# Patient Record
Sex: Female | Born: 1935 | Race: White | Hispanic: No | Marital: Married | Smoking: Never smoker
Health system: Southern US, Community
[De-identification: ages and names within clinical notes are randomized; demographics above are authoritative.]

## PROBLEM LIST (undated history)

## (undated) DIAGNOSIS — D126 Benign neoplasm of colon, unspecified: Secondary | ICD-10-CM

## (undated) DIAGNOSIS — K8020 Calculus of gallbladder without cholecystitis without obstruction: Secondary | ICD-10-CM

## (undated) DIAGNOSIS — K5790 Diverticulosis of intestine, part unspecified, without perforation or abscess without bleeding: Secondary | ICD-10-CM

## (undated) DIAGNOSIS — F0390 Unspecified dementia without behavioral disturbance: Secondary | ICD-10-CM

## (undated) DIAGNOSIS — K859 Acute pancreatitis without necrosis or infection, unspecified: Secondary | ICD-10-CM

## (undated) DIAGNOSIS — E785 Hyperlipidemia, unspecified: Secondary | ICD-10-CM

## (undated) DIAGNOSIS — T7840XA Allergy, unspecified, initial encounter: Secondary | ICD-10-CM

## (undated) DIAGNOSIS — K449 Diaphragmatic hernia without obstruction or gangrene: Secondary | ICD-10-CM

## (undated) HISTORY — DX: Benign neoplasm of colon, unspecified: D12.6

## (undated) HISTORY — DX: Diaphragmatic hernia without obstruction or gangrene: K44.9

## (undated) HISTORY — DX: Acute pancreatitis without necrosis or infection, unspecified: K85.90

## (undated) HISTORY — DX: Hyperlipidemia, unspecified: E78.5

## (undated) HISTORY — PX: COLONOSCOPY: SHX174

## (undated) HISTORY — PX: DILATION AND CURETTAGE OF UTERUS: SHX78

## (undated) HISTORY — DX: Calculus of gallbladder without cholecystitis without obstruction: K80.20

## (undated) HISTORY — DX: Diverticulosis of intestine, part unspecified, without perforation or abscess without bleeding: K57.90

## (undated) HISTORY — DX: Allergy, unspecified, initial encounter: T78.40XA

## (undated) HISTORY — PX: TONSILLECTOMY AND ADENOIDECTOMY: SUR1326

---

## 1998-07-11 ENCOUNTER — Other Ambulatory Visit: Admission: RE | Admit: 1998-07-11 | Discharge: 1998-07-11 | Payer: Self-pay | Admitting: Cardiology

## 1999-08-18 ENCOUNTER — Other Ambulatory Visit: Admission: RE | Admit: 1999-08-18 | Discharge: 1999-08-18 | Payer: Self-pay | Admitting: Cardiology

## 1999-09-16 ENCOUNTER — Encounter: Admission: RE | Admit: 1999-09-16 | Discharge: 1999-09-16 | Payer: Self-pay | Admitting: Cardiology

## 1999-12-11 ENCOUNTER — Encounter: Payer: Self-pay | Admitting: Cardiology

## 1999-12-11 ENCOUNTER — Encounter: Admission: RE | Admit: 1999-12-11 | Discharge: 1999-12-11 | Payer: Self-pay | Admitting: Cardiology

## 2000-01-11 ENCOUNTER — Other Ambulatory Visit: Admission: RE | Admit: 2000-01-11 | Discharge: 2000-01-11 | Payer: Self-pay | Admitting: Cardiology

## 2000-01-20 ENCOUNTER — Encounter (INDEPENDENT_AMBULATORY_CARE_PROVIDER_SITE_OTHER): Payer: Self-pay

## 2000-01-20 ENCOUNTER — Other Ambulatory Visit: Admission: RE | Admit: 2000-01-20 | Discharge: 2000-01-20 | Payer: Self-pay | Admitting: Obstetrics & Gynecology

## 2000-03-29 ENCOUNTER — Ambulatory Visit (HOSPITAL_COMMUNITY): Admission: RE | Admit: 2000-03-29 | Discharge: 2000-03-29 | Payer: Self-pay | Admitting: Obstetrics & Gynecology

## 2000-03-29 ENCOUNTER — Encounter (INDEPENDENT_AMBULATORY_CARE_PROVIDER_SITE_OTHER): Payer: Self-pay | Admitting: Specialist

## 2000-12-12 ENCOUNTER — Encounter: Admission: RE | Admit: 2000-12-12 | Discharge: 2000-12-12 | Payer: Self-pay | Admitting: Internal Medicine

## 2000-12-12 ENCOUNTER — Encounter: Payer: Self-pay | Admitting: Internal Medicine

## 2001-03-07 ENCOUNTER — Other Ambulatory Visit: Admission: RE | Admit: 2001-03-07 | Discharge: 2001-03-07 | Payer: Self-pay | Admitting: Obstetrics & Gynecology

## 2001-12-12 ENCOUNTER — Encounter: Payer: Self-pay | Admitting: Internal Medicine

## 2001-12-12 ENCOUNTER — Encounter: Admission: RE | Admit: 2001-12-12 | Discharge: 2001-12-12 | Payer: Self-pay | Admitting: Internal Medicine

## 2002-04-03 ENCOUNTER — Other Ambulatory Visit: Admission: RE | Admit: 2002-04-03 | Discharge: 2002-04-03 | Payer: Self-pay | Admitting: Obstetrics & Gynecology

## 2002-10-23 ENCOUNTER — Encounter: Admission: RE | Admit: 2002-10-23 | Discharge: 2003-01-03 | Payer: Self-pay | Admitting: Orthopedic Surgery

## 2002-12-06 DIAGNOSIS — D126 Benign neoplasm of colon, unspecified: Secondary | ICD-10-CM

## 2002-12-06 HISTORY — DX: Benign neoplasm of colon, unspecified: D12.6

## 2002-12-18 ENCOUNTER — Encounter: Admission: RE | Admit: 2002-12-18 | Discharge: 2002-12-18 | Payer: Self-pay | Admitting: Internal Medicine

## 2002-12-18 ENCOUNTER — Encounter: Payer: Self-pay | Admitting: Internal Medicine

## 2003-04-29 ENCOUNTER — Other Ambulatory Visit: Admission: RE | Admit: 2003-04-29 | Discharge: 2003-04-29 | Payer: Self-pay | Admitting: Obstetrics & Gynecology

## 2003-07-09 ENCOUNTER — Encounter: Admission: RE | Admit: 2003-07-09 | Discharge: 2003-07-09 | Payer: Self-pay | Admitting: Sports Medicine

## 2003-08-09 ENCOUNTER — Encounter: Admission: RE | Admit: 2003-08-09 | Discharge: 2003-08-09 | Payer: Self-pay | Admitting: Sports Medicine

## 2003-09-03 ENCOUNTER — Encounter: Admission: RE | Admit: 2003-09-03 | Discharge: 2003-09-03 | Payer: Self-pay | Admitting: Sports Medicine

## 2003-10-18 ENCOUNTER — Encounter: Admission: RE | Admit: 2003-10-18 | Discharge: 2003-10-18 | Payer: Self-pay | Admitting: Sports Medicine

## 2003-12-10 ENCOUNTER — Encounter: Admission: RE | Admit: 2003-12-10 | Discharge: 2003-12-10 | Payer: Self-pay | Admitting: Obstetrics & Gynecology

## 2003-12-27 ENCOUNTER — Encounter: Admission: RE | Admit: 2003-12-27 | Discharge: 2003-12-27 | Payer: Self-pay | Admitting: Sports Medicine

## 2004-03-03 ENCOUNTER — Encounter: Admission: RE | Admit: 2004-03-03 | Discharge: 2004-03-03 | Payer: Self-pay | Admitting: Sports Medicine

## 2004-05-15 ENCOUNTER — Other Ambulatory Visit: Admission: RE | Admit: 2004-05-15 | Discharge: 2004-05-15 | Payer: Self-pay | Admitting: Obstetrics & Gynecology

## 2004-09-29 ENCOUNTER — Ambulatory Visit: Payer: Self-pay | Admitting: Sports Medicine

## 2004-10-16 ENCOUNTER — Ambulatory Visit: Payer: Self-pay | Admitting: Sports Medicine

## 2004-10-27 ENCOUNTER — Ambulatory Visit: Payer: Self-pay | Admitting: Sports Medicine

## 2004-12-04 ENCOUNTER — Ambulatory Visit: Payer: Self-pay | Admitting: Sports Medicine

## 2005-01-14 ENCOUNTER — Encounter: Admission: RE | Admit: 2005-01-14 | Discharge: 2005-01-14 | Payer: Self-pay | Admitting: Internal Medicine

## 2006-01-18 ENCOUNTER — Encounter: Admission: RE | Admit: 2006-01-18 | Discharge: 2006-01-18 | Payer: Self-pay | Admitting: Internal Medicine

## 2006-02-24 ENCOUNTER — Ambulatory Visit: Payer: Self-pay | Admitting: Gastroenterology

## 2006-03-04 ENCOUNTER — Ambulatory Visit: Payer: Self-pay | Admitting: Gastroenterology

## 2007-01-26 ENCOUNTER — Encounter: Admission: RE | Admit: 2007-01-26 | Discharge: 2007-01-26 | Payer: Self-pay | Admitting: Internal Medicine

## 2008-01-19 ENCOUNTER — Telehealth: Payer: Self-pay | Admitting: Sports Medicine

## 2008-01-30 ENCOUNTER — Encounter: Admission: RE | Admit: 2008-01-30 | Discharge: 2008-01-30 | Payer: Self-pay | Admitting: Internal Medicine

## 2009-01-30 ENCOUNTER — Encounter: Admission: RE | Admit: 2009-01-30 | Discharge: 2009-01-30 | Payer: Self-pay | Admitting: Internal Medicine

## 2010-02-02 ENCOUNTER — Encounter: Admission: RE | Admit: 2010-02-02 | Discharge: 2010-02-02 | Payer: Self-pay | Admitting: Internal Medicine

## 2010-06-15 ENCOUNTER — Ambulatory Visit: Payer: Self-pay | Admitting: Sports Medicine

## 2010-06-15 DIAGNOSIS — M7750 Other enthesopathy of unspecified foot: Secondary | ICD-10-CM | POA: Insufficient documentation

## 2010-06-15 DIAGNOSIS — M7662 Achilles tendinitis, left leg: Secondary | ICD-10-CM | POA: Insufficient documentation

## 2010-06-23 ENCOUNTER — Ambulatory Visit: Payer: Self-pay | Admitting: Sports Medicine

## 2010-06-23 DIAGNOSIS — S32000A Wedge compression fracture of unspecified lumbar vertebra, initial encounter for closed fracture: Secondary | ICD-10-CM | POA: Insufficient documentation

## 2010-06-24 ENCOUNTER — Telehealth: Payer: Self-pay | Admitting: Sports Medicine

## 2010-06-30 ENCOUNTER — Ambulatory Visit: Payer: Self-pay | Admitting: Sports Medicine

## 2010-07-30 ENCOUNTER — Ambulatory Visit: Payer: Self-pay | Admitting: Sports Medicine

## 2010-11-09 ENCOUNTER — Ambulatory Visit: Payer: Self-pay | Admitting: Sports Medicine

## 2010-12-06 HISTORY — PX: CHOLECYSTECTOMY: SHX55

## 2011-01-04 ENCOUNTER — Other Ambulatory Visit: Payer: Self-pay | Admitting: Internal Medicine

## 2011-01-04 DIAGNOSIS — E049 Nontoxic goiter, unspecified: Secondary | ICD-10-CM

## 2011-01-05 NOTE — Assessment & Plan Note (Signed)
Summary: ACHILLES PAIN/MJD   Vital Signs:  Patient profile:   77 year old female Height:      65 inches Weight:      142 pounds BMI:     23.72 BP sitting:   142 / 68  (left arm) Cuff size:   regular  Vitals Entered By: Tessie Fass CMA (June 15, 2010 10:25 AM) CC: right achilles pain   CC:  right achilles pain.  History of Present Illness: 77 year old female with leg pain.  Hx Achilles tendinopathy  ~5 years ago, did eccentric exercises and got better.    Has been doing lots of walking, works out three times weekly on a stride machine, 35 mins, 2 miles.  Has been on her feet an unusually long amount of time this past week.  This past Thursday (4d ago) started noticing pain in R achilles tendon with some swelling.  No injuries, no pops, no weakness.  Getting worse, no radiation.  Worse with palpation, not really better with rest.  Foot pain:  On R foot just at 2nd-3rd metatarsal head.  Hasn't tried any medication.  Current Medications (verified): 1)  Ketoprofen  Powd (Ketoprofen) .... Please Compound Into Paste To Apply To Skin Over Achilles Tendon Two Times A Day.  Allergies (verified): No Known Drug Allergies  Review of Systems       See HPI  Physical Exam  General:  Well-developed,well-nourished,in no acute distress; alert,appropriate and cooperative throughout examination Msk:  Ankle: R No visible erythema or swelling.  Palpable knot in achilles tendon.  Range of motion is full in all directions. Strength is 5/5 in all directions with pain to plantarflexion. Stable lateral and medial ligaments; squeeze test and kleiger test unremarkable; Talar dome nontender; No pain at base of 5th MT; No tenderness over cuboid; No tenderness over N spot or navicular prominence No tenderness on posterior aspects of lateral and medial malleolus Some tenderness over 2nd and 3rd metatarsal heads. No sign of peroneal tendon subluxations; Negative tarsal tunnel tinel's Able to walk  4 steps.  Additional Exam:  MSK [place] with significant (25%) thickening of right achilles tendon compared to left without much increased vascularity.  nodule is 2 cm above insertion.  trans views are normal.  No tears identified.  Images saved.   Impression & Recommendations:  Problem # 1:  ACHILLES TENDINITIS (ICD-726.71) Assessment New  Will start on typical regimen of eccentric exercises, pt to to 8-10 reps with each set instead of the 12 listed on handout. Topical ketoprofen 20% compounded at [facility] BID to skin over point of maximal tenderness. RTC 6 weeks or sooner if no better. Could try topical NTG if no better by then.  Orders: [place] LIMITED ([id number])  Problem # 2:  METATARSALGIA (ICD-726.70) Assessment: New  Right side MT pad placed.  RTC prn if problems  Complete Medication List: 1)  Ketoprofen Powd (Ketoprofen) .... Please compound into paste to apply to skin over achilles tendon two times a day.  Patient Instructions: 1)  Great to meet you today 2)  You have achilles tendinopathy 3)  Do the attached exercises (8-10 reps each set) 4)  Apply topical ketoprofen to area of maximal tenderness (do not spread over entire calf) 5)  Come back to see [place] if no better in 6 weeks. 6)  -Dr. Benjamin Stain Prescriptions: KETOPROFEN  POWD (KETOPROFEN) Please compound into paste to apply to skin over achilles tendon two times a day.  #6wks QS x 0  Entered by:   Rodney Langton MD   Authorized by:   Enid Baas MD   Signed by:   Rodney Langton MD on 06/15/2010   Method used:   Electronically to        [facility]* (retail)       [address]       [place], [place]  [id number]       Ph: [phone]       Fax: [phone]   RxID:   [id number]

## 2011-01-05 NOTE — Assessment & Plan Note (Signed)
Summary: F/U [facility]   Vital Signs:  Patient profile:   77 year old female BP sitting:   110 / 60  Vitals Entered By: Lillia Pauls CMA (July 30, 2010 8:41 AM)  History of Present Illness: 77yo female for f/u LBP and achilles. Back feeling 20-30% better. Still having pain if on feet for long periods of time.  Improves with sitting/resting. Pain occasionally radiates down to both legs.  Denies any change in bowel or bladder.  No numnbess/tingling. Continues to do back exercises.  Going to the gym 3-times/wk.  Walking up to 2.5 miles on machines at gym. Taking ibuprofen as needed several times a week.  Right achilles is a little better. Doing achilles exercises that seem to help.   Allergies: No Known Drug Allergies  Physical Exam  General:  Well-developed,well-nourished,in no acute distress; alert,appropriate and cooperative throughout examination Msk:  Lumbosacral Exam: Inspection-deformity: No deformity, erythema, swelling. Range of Motion:    Forward Flexion:   80 degrees    Hyperextension:   10 degrees    Schober's:        n/a cm    Right Lateral Bend:   25 degrees    Left Lateral Bend:   25 degrees Lying Straight Leg Raise:    Right:  negative    Left:  negative Sitting Straight Leg Raise:    Right:  negative    Left:  negative Contralateral Straight Leg Raise:    Right:  negative    Left:  negative Toe Walking:    Right:  normal    Left:  normal Heel Walking:    Right:  normal    Left:  normal  Achilles with minimal tenderness to palpation.  No swelling.  Neurologic:  sensation intact to light touch.     Impression & Recommendations:  Problem # 1:  LOW BACK PAIN SYNDROME (ICD-724.2) Assessment Improved - Pain improving, noted to have improved ROM on exam today - Cont. to walk regularly - Should continue home exercises.  May start doing light weight lifting with arms. - May cont. ibuprofen as needed - f/u 71-months, may return sooner if  needed.  Her updated medication list for this problem includes:    Ketoprofen Powd (Ketoprofen) .Marland Kitchen... Please compound into paste to apply to skin over achilles tendon two times a day.    Aspirin 81 Mg Chew (Aspirin) .Marland Kitchen... 1 by mouth qhs  Problem # 2:  ACHILLES TENDINITIS (ICD-726.71) Assessment: Improved - Cont achilles exercises - f/u 82-months  Complete Medication List: 1)  Ketoprofen Powd (Ketoprofen) .... Please compound into paste to apply to skin over achilles tendon two times a day. 2)  Simvastatin 20 Mg Tabs (Simvastatin) .Marland Kitchen.. 1 by mouth qd 3)  Aspirin 81 Mg Chew (Aspirin) .Marland Kitchen.. 1 by mouth qhs  Appended Document: F/U [facility] Reviewed recent Dexa Scan results during visit today.  Values in osteopenic range &  noted to be improving from last Dexa Scan.  She should cont. calcium & vitamin D as prescribed.  Should cont. to walk regularly.

## 2011-01-05 NOTE — Assessment & Plan Note (Signed)
Summary: F/U [facility]   Vital Signs:  Patient profile:   77 year old female BP sitting:   105 / 68  Vitals Entered By: Lillia Pauls CMA (November 09, 2010 9:48 AM)  History of Present Illness: Low Back much better If on feeet a lot goes up into upper back and shoulders if standing too long will be helping feed homeless and stands for good while Low back then aches Radiation into rt leg at times  Rt achilles tendon generally better with topical ketoprofen.   Allergies: No Known Drug Allergies  Physical Exam  General:  Well-developed,well-nourished,in no acute distress; alert,appropriate and cooperative throughout examination Msk:  Still has achilles nodule on rt but much smaller No achilles nodule on lt No generalized tendon tenderness 3rd toe longer than the rest on rt  SI joints move Faber tight on rt, less tight on left Rt and lt straight leg raise good at 60 degrees  Hip abduction good on left and right good back motion norm strength      Impression & Recommendations:  Problem # 1:  LOW BACK PAIN SYNDROME (ICD-724.2)  Her updated medication list for this problem includes:    Ketoprofen Powd (Ketoprofen) .Marland Kitchen... Please compound into paste to apply to skin over achilles tendon two times a day.    Aspirin 81 Mg Chew (Aspirin) .Marland Kitchen... 1 by mouth qhs  Orders: Lumbar Flexible Support (N8295)  try backj support  do some exercises to keep back stable  reck 4 mos  Problem # 2:  ACHILLES TENDINITIS (ICD-726.71) much improved  keep up exercises and top med as needed  Complete Medication List: 1)  Ketoprofen Powd (Ketoprofen) .... Please compound into paste to apply to skin over achilles tendon two times a day. 2)  Simvastatin 20 Mg Tabs (Simvastatin) .Marland Kitchen.. 1 by mouth qd 3)  Aspirin 81 Mg Chew (Aspirin) .Marland Kitchen.. 1 by mouth qhs  Patient Instructions: 1)  Stretch out rt hip with leg straight and knee bent 2)  Standing hip rotations 3)  Do a few sit ups and a few knee  to chest exercises 4)  Continue achilles exercises- heel rasies on a step    Orders Added: 1)  Lumbar Flexible Support [L0625] 2)  Est. Patient Level III [62130]

## 2011-01-05 NOTE — Assessment & Plan Note (Signed)
Summary: 9:00,BACK PAIN,MC   Vital Signs:  Patient profile:   77 year old female Pulse (ortho):   62 / minute BP sitting:   134 / 70  (left arm)  Vitals Entered By: Terese Door (June 23, 2010 8:57 AM) CC: Back "went out"on Friday Pain Assessment Patient in pain? yes     Location: back Intensity: 10   CC:  Back "went out"on Friday.  History of Present Illness: 77 yo F w LBP radiated to buttocks on left 5 days ago this started has happened 4 to 5 x in past 10 yrs ago hospitalized with LBP  1 wk ago doing a lot of yard work  no bowel or bladder probs no fever  Allergies: No Known Drug Allergies  Physical Exam  General:  Well-developed,well-nourished,in no acute distress; alert,appropriate and cooperative throughout examination Msk:  No visible paraspinal spasm Tenderness over L4 ROM extremely limited on flexion, extension. Side flexion more limited on left than right. Very limited lumbar rotation Neurologic:  power and sensation intact on both legs. reflexes normal Mildly + SLR on left No bowel or bladder symptoms  able to do heel, toe walk able to stand in supination   Impression & Recommendations:  Problem # 1:  LOW BACK PAIN SYNDROME (ICD-724.2)  Her updated medication list for this problem includes:    Ketoprofen Powd (Ketoprofen) .Marland Kitchen... Please compound into paste to apply to skin over achilles tendon two times a day.    Aspirin 81 Mg Chew (Aspirin) .Marland Kitchen... 1 by mouth qhs    Tramadol Hcl 50 Mg Tabs (Tramadol hcl) .Marland Kitchen... 1 by mouth qid as needed pain  multiple flares of this over past 20 years  sounds like disogenic component with radiation into buttocks on left and + exam  see instructions  Complete Medication List: 1)  Ketoprofen Powd (Ketoprofen) .... Please compound into paste to apply to skin over achilles tendon two times a day. 2)  Simvastatin 20 Mg Tabs (Simvastatin) .Marland Kitchen.. 1 by mouth qd 3)  Aspirin 81 Mg Chew (Aspirin) .Marland Kitchen.. 1 by mouth qhs 4)   Tramadol Hcl 50 Mg Tabs (Tramadol hcl) .Marland Kitchen.. 1 by mouth qid as needed pain  Patient Instructions: 1)  Hot shower 2 to 3 x daily 2)  do motion exercises 3)  walk 5 mins after these 4)  thermacare wraps at night 5)  tramadol for pain up to 4 per day 6)  reck in 1 week Prescriptions: TRAMADOL HCL 50 MG TABS (TRAMADOL HCL) 1 by mouth qid as needed pain  #60 x 2   Entered and Authorized by:   Enid Baas MD   Signed by:   Enid Baas MD on 06/23/2010   Method used:   Electronically to        [phone]* (retail)       [address] [place], [place]  [address]       Ph: [phone] or [phone]       Fax: [phone]   RxID:   [id number]

## 2011-01-05 NOTE — Assessment & Plan Note (Signed)
Summary: 9:00,F/u back,mc   Vital Signs:  Patient profile:   77 year old female Pulse rate:   62 / minute BP sitting:   117 / 72  Vitals Entered By: Terese Door (June 30, 2010 9:05 AM) CC: F/U back pain   CC:  F/U back pain.  History of Present Illness: 77 yo F here for f/u of LBP  Much improved since last visit.  Has been using  thermacare wraps at night, and doing  motion exercises.  Able to start walking regimen again without pain. Occasionally requiring 1/2 tramadol for pain.  Pain not radiating, no numbness or weakness, no bowel or bladder probs   Allergies (verified): No Known Drug Allergies   Detailed Back/Spine Exam  Lumbosacral Exam:  Inspection-deformity:    Normal    small vesicular, erytheratous rash on L LB Range of Motion:    Forward Flexion:   50 degrees    Hyperextension:   10 degrees    Schober's:        n/a cm    Right Lateral Bend:   25 degrees    Left Lateral Bend:   25 degrees Lying Straight Leg Raise:    Right:  negative    Left:  negative Sitting Straight Leg Raise:    Right:  negative    Left:  negative Contralateral Straight Leg Raise:    Right:  negative    Left:  negative Toe Walking:    Right:  normal    Left:  normal Heel Walking:    Right:  normal    Left:  normal   Impression & Recommendations:  Problem # 1:  LOW BACK PAIN SYNDROME (ICD-724.2) Assessment Improved ROM improved since last visit but not returned to baseline.  New rash present likely HS 1 infection.  Not painful and seems to be improving without medical intervention.  see pt instructions  Her updated medication list for this problem includes:    Ketoprofen Powd (Ketoprofen) .Marland Kitchen... Please compound into paste to apply to skin over achilles tendon two times a day.    Aspirin 81 Mg Chew (Aspirin) .Marland Kitchen... 1 by mouth qhs    Tramadol Hcl 50 Mg Tabs (Tramadol hcl) .Marland Kitchen... 1 by mouth qid as needed pain  Problem # 2:  ACHILLES TENDINITIS (ICD-726.71) restart rehab  program now that back is improving  will ck this on RTC in 1 mo  Complete Medication List: 1)  Ketoprofen Powd (Ketoprofen) .... Please compound into paste to apply to skin over achilles tendon two times a day. 2)  Simvastatin 20 Mg Tabs (Simvastatin) .Marland Kitchen.. 1 by mouth qd 3)  Aspirin 81 Mg Chew (Aspirin) .Marland Kitchen.. 1 by mouth qhs 4)  Tramadol Hcl 50 Mg Tabs (Tramadol hcl) .Marland Kitchen.. 1 by mouth qid as needed pain  Patient Instructions: 1)  It's ok to go to the gym, but sit down to do your resistance exercises. 2)  Continue walking and slowly increase your time over the next month. 3)  Take 1/2 pill of the pain medicine if you need it.  Take it at night. 4)  Continue with your achilles exercises. 5)  Please schedule a follow-up appointment in 1 month.

## 2011-01-05 NOTE — Progress Notes (Signed)
Summary: Reaction to Tramadol  ---- Converted from flag ---- ---- 06/24/2010 8:55 AM, Marily Memos wrote: Pt called regarding a reaction  she had with her medicine yesterday. She took  tramadol and was sweating and felt sick after she took it also states that her blood pressure dropped. Pt states that she will try the medicine again if Dr Darrick Penna recommends it. She requested a call back @ [phone]. ------------------------------    Follow-up for Phone Call       Follow-up by: Terese Door,  June 24, 2010 10:12 AM     Spoke with patient.  Advised per Dr. Darrick Penna to try cutting Tramadol in half and try that first.  If she still has the same reaction to call us back.  We may need to changed her to a different medication.  Pt. in agreement....Marland KitchenMarland KitchenTerese Door, CMA

## 2011-01-08 ENCOUNTER — Ambulatory Visit
Admission: RE | Admit: 2011-01-08 | Discharge: 2011-01-08 | Disposition: A | Discharge status: Home / Self Care | Payer: Medicare Other | Source: Ambulatory Visit | Attending: Internal Medicine | Admitting: Internal Medicine

## 2011-01-08 DIAGNOSIS — E049 Nontoxic goiter, unspecified: Secondary | ICD-10-CM

## 2011-01-12 ENCOUNTER — Other Ambulatory Visit: Payer: Self-pay | Admitting: Internal Medicine

## 2011-01-12 DIAGNOSIS — E042 Nontoxic multinodular goiter: Secondary | ICD-10-CM

## 2011-01-14 ENCOUNTER — Other Ambulatory Visit: Payer: Self-pay | Admitting: Internal Medicine

## 2011-01-14 DIAGNOSIS — E042 Nontoxic multinodular goiter: Secondary | ICD-10-CM

## 2011-01-20 ENCOUNTER — Other Ambulatory Visit: Payer: Self-pay | Admitting: Interventional Radiology

## 2011-01-20 ENCOUNTER — Ambulatory Visit
Admission: RE | Admit: 2011-01-20 | Discharge: 2011-01-20 | Disposition: A | Discharge status: Home / Self Care | Payer: Medicare Other | Source: Ambulatory Visit | Attending: Internal Medicine | Admitting: Internal Medicine

## 2011-01-20 ENCOUNTER — Other Ambulatory Visit (HOSPITAL_COMMUNITY)
Admission: RE | Admit: 2011-01-20 | Discharge: 2011-01-20 | Disposition: A | Discharge status: Home / Self Care | Payer: Medicare Other | Source: Ambulatory Visit | Attending: Interventional Radiology | Admitting: Interventional Radiology

## 2011-01-20 DIAGNOSIS — E049 Nontoxic goiter, unspecified: Secondary | ICD-10-CM | POA: Insufficient documentation

## 2011-01-20 DIAGNOSIS — E042 Nontoxic multinodular goiter: Secondary | ICD-10-CM

## 2011-02-08 ENCOUNTER — Other Ambulatory Visit: Payer: Self-pay | Admitting: Internal Medicine

## 2011-02-08 DIAGNOSIS — Z1231 Encounter for screening mammogram for malignant neoplasm of breast: Secondary | ICD-10-CM

## 2011-02-11 ENCOUNTER — Ambulatory Visit: Payer: Medicare Other

## 2011-02-15 ENCOUNTER — Ambulatory Visit: Payer: Medicare Other | Admitting: Sports Medicine

## 2011-02-17 ENCOUNTER — Encounter: Payer: Self-pay | Admitting: Gastroenterology

## 2011-02-19 ENCOUNTER — Ambulatory Visit
Admission: RE | Admit: 2011-02-19 | Discharge: 2011-02-19 | Disposition: A | Discharge status: Home / Self Care | Payer: Medicare Other | Source: Ambulatory Visit | Attending: Internal Medicine | Admitting: Internal Medicine

## 2011-02-19 DIAGNOSIS — Z1231 Encounter for screening mammogram for malignant neoplasm of breast: Secondary | ICD-10-CM

## 2011-02-23 NOTE — Letter (Signed)
Summary: Colonoscopy Letter  [facility]  [address] [place], [place] [address]   Phone: [phone]  Fax: [phone]      February 17, 2011 MRN: [id number]   Autumn Wallace [address] [place], [place]  [address]   Dear Autumn Wallace,   According to your medical record, it is time for you to schedule a Colonoscopy. The American Cancer Society recommends this procedure as a method to detect early colon cancer. Patients with a family history of colon cancer, or a personal history of colon polyps or inflammatory bowel disease are at increased risk.  This letter has been generated based on the recommendations made at the time of your procedure. If you feel that in your particular situation this may no longer apply, please contact our office.  Please call our office at [phone] to schedule this appointment or to update your records at your earliest convenience.  Thank you for cooperating with [place] to provide you with the very best care possible.   Sincerely,  Judie Petit T. Russella Dar, M.D.  [facility] [phone]

## 2011-03-10 ENCOUNTER — Ambulatory Visit (INDEPENDENT_AMBULATORY_CARE_PROVIDER_SITE_OTHER): Payer: Medicare Other | Admitting: Sports Medicine

## 2011-03-10 VITALS — BP 112/69

## 2011-03-10 DIAGNOSIS — M7660 Achilles tendinitis, unspecified leg: Secondary | ICD-10-CM

## 2011-03-10 DIAGNOSIS — M545 Low back pain, unspecified: Secondary | ICD-10-CM

## 2011-03-10 NOTE — Progress Notes (Signed)
  Subjective:    Patient ID: Autumn Wallace, female    DOB: [birth year], 77 y.o.   MRN: [id number]  HPI LBP - now working outside Careful and does use brace No flares since last visit Only meds tylenol and ibuprofen  Accidental fall on RT knee in Feb Felt bruised for a couple of weeks but now better  RT AT Still some pain that does better with top ketoprofen  Now back to full exercise routine   Review of Systems     Objective:   Physical Exam Alert and in NAD  Knee: Normal to inspection with no erythema or effusion or obvious bony abnormalities. Palpation normal with no warmth or joint line tenderness or patellar tenderness or condyle tenderness. ROM normal in flexion and extension and lower leg rotation. Ligaments with solid consistent endpoints including ACL, PCL, LCL, MCL. Negative Mcmurray's and provocative meniscal tests. Non painful patellar compression. Patellar and quadriceps tendons unremarkable. Hamstring and quadriceps strength is normal.  Back with good extension and flexion Norm rotation and lat bending Good heel, toe walk  Norm strength Neg SLR  RT AT  Nodule is smaller and less firm No TTP  Left shows minimal nodule and no TTP       Assessment & Plan:

## 2011-03-10 NOTE — Assessment & Plan Note (Signed)
No recent fluids and symptoms and she estimates that she is at least 75% better. She is back doing yard work and has not had any persistent back pain after any of her activities.  I am happy to recheck this in 6 months if she is doing well.

## 2011-03-10 NOTE — Assessment & Plan Note (Signed)
This clinically looks improved. At the present time it does not limit her from walking up to 3 miles a day on the exercise equipment. With this in mind I think she should keep up some of her exercises and we will not make any changes in treatment. She continues to use topical ketoprofen when needed.

## 2011-04-23 NOTE — Op Note (Signed)
[facility]  Patient:    Autumn Wallace, Autumn Wallace                     MRN: [id number] Proc. Date: 03/29/00 Adm. Date:  [id number] Attending:  Genia Del                           Operative Report  PREOPERATIVE DIAGNOSIS:       Postmenopausal abnormal uterine bleeding on hormone replacement therapy with thickened endometrium on ultrasound.  POSTOPERATIVE DIAGNOSIS:      Postmenopausal abnormal uterine bleeding on hormone replacement therapy with thickened endometrium on ultrasound.  OPERATION:                    Diagnostic hysteroscopy plus D&C.  SURGEON:                      Genia Del, M.D.  ASSISTANT:  ANESTHESIA:                   Ellison Hughs., M.D.  ESTIMATED BLOOD LOSS:  DESCRIPTION OF PROCEDURE:     Under MAC, the patient is in lithotomy position. She is prepped with Betadine in the suprapubic, vulvar, and vaginal areas, and draped as usual.  The vaginal examination revealed a retroverted uterus, normal in volume, and mobile.  No adnexal mass.  The speculum was introduced.  A paracervical block is done with Nesocaine 1% 20 cc total.  Then the anterior lip of the cervix was  grasped with a tenaculum.  Dilatation is achieved with Hegar dilators up to #27  easily.  Then the diagnostic hysteroscope is introduced.  The cavity is visualized entirely. No lesion is seen.  Pictures are taken.  The diagnostic hysteroscope s removed.  Then we proceeded with systematic curettage with a sharp curet on all  intrauterine surfaces.  The specimen is sent to pathology.  A last look is taken with the diagnostic hysteroscope.  The cavity is intact and normal.  Hemostasis is adequate.  Instruments are removed.  Estimated blood loss was minimal.  No complications occurred and the patient was transferred to the recovery room in ood status. DD:  03/29/00 TD:  03/29/00 Job: [id number]

## 2011-06-10 ENCOUNTER — Encounter: Payer: Self-pay | Admitting: Gastroenterology

## 2011-06-25 ENCOUNTER — Other Ambulatory Visit: Payer: Self-pay | Admitting: Sports Medicine

## 2011-06-30 ENCOUNTER — Ambulatory Visit (INDEPENDENT_AMBULATORY_CARE_PROVIDER_SITE_OTHER): Payer: Medicare Other | Admitting: Sports Medicine

## 2011-06-30 ENCOUNTER — Encounter: Payer: Self-pay | Admitting: Sports Medicine

## 2011-06-30 VITALS — BP 124/79 | HR 69 | Ht 65.0 in | Wt 125.0 lb

## 2011-06-30 DIAGNOSIS — M7660 Achilles tendinitis, unspecified leg: Secondary | ICD-10-CM

## 2011-06-30 NOTE — Patient Instructions (Signed)
Start doing toe raise and lower exercise on a book- with knees straight, and knees bent   Start with doing 2 sets of 8 every other day Then after a few days - increase to 2 sets of 10, continue increasing every few days until you get to 2 sets of 15 After you are comfortable doing 2 sets of 15, add a 3rd set

## 2011-06-30 NOTE — Progress Notes (Signed)
  Subjective:    Patient ID: Autumn Wallace, female    DOB: 07-Jul-1932, 77 y.o.   MRN: [id number]  HPI  Pt presents to clinic for evaluation of achilles tendon pain L > R now. L achilles is swelling and painful now, started about 1 week ago Pt has been very active this summer, mowing, gardening, working out at [employer] x3 week.   Uses stride machine, leg press, and shoulder pull downs at the gym.  Uses ketoprofen gel which is helpful  She had partial tears of her Achilles tendon in the past Both of these responded to exercises and topical ketoprofen Originally her right Achilles had a large nodule that is gradually returning to more normal with   Review of Systems     Objective:   Physical Exam    pleasant and in no acute distress The right Achilles tendon is normal in thickness at the insertion 3 cm above the calcaneus there is a nodule that is mildly thickened but nontender Calf muscle appear strong  Left Achilles tendon is visibly swollen starting about 1 cm above the calcaneus extending upward 4 cm This is tender to squeeze There is no redness The insertion is nonpainful  Gait shows that she is limping with poor pushoff on the left  MSK ultrasound  Right Achilles tendon shows normal texture At the insertion with is about 0.4 cm  She does have a nodule about 2 cm above the insertion that is about 0.63 cm greatest width There is no sign of tearing on longitudinal or trans view Doppler is normal  Left Achilles tendon There is some hypoechoic change in the sheath surrounding the tendon The width of the tendon at the insertion is about 0.4 cm Stomach 1 cm and extending to 4 cm above the insertion there is a nodule that gradually thickens Mrs. 0.84 cm in maximum thickness There is a calcification and some hypoechoic change on trans view Doppler activity is normal    Assessment & Plan:

## 2011-06-30 NOTE — Assessment & Plan Note (Signed)
She appears to have developed a flare of her chronic Achilles problems She has done very well with these in the past 2 years Now the primary tendon involved is the left and she has no symptoms on the right  We will start her on ketoprofen 4 times a day - gel  very easy Achilles rehabilitation doing these exercises on a book  Heel lifts in all shoes Icing daily Recheck in 4-6 weeks with repeat scan

## 2011-07-21 ENCOUNTER — Ambulatory Visit (INDEPENDENT_AMBULATORY_CARE_PROVIDER_SITE_OTHER): Payer: Medicare Other | Admitting: Sports Medicine

## 2011-07-21 ENCOUNTER — Ambulatory Visit (AMBULATORY_SURGERY_CENTER): Payer: Medicare Other | Admitting: *Deleted

## 2011-07-21 VITALS — BP 105/64

## 2011-07-21 VITALS — Ht 65.0 in | Wt 137.0 lb

## 2011-07-21 DIAGNOSIS — M7660 Achilles tendinitis, unspecified leg: Secondary | ICD-10-CM

## 2011-07-21 DIAGNOSIS — Z1211 Encounter for screening for malignant neoplasm of colon: Secondary | ICD-10-CM

## 2011-07-21 MED ORDER — PEG-KCL-NACL-NASULF-NA ASC-C 100 G PO SOLR: ORAL | Status: DC

## 2011-07-21 NOTE — Progress Notes (Signed)
  Subjective:    Patient ID: Autumn Wallace, female    DOB: [birth year], 77 y.o.   MRN: [id number]  HPI Patient returns 3 wks after Tx for Left AT She is using voltaren She is doing exercises She has already seen some improvement Xtraining on bike and not walking much though at this point   Review of Systems     Objective:   Physical Exam  NAD L AT has swollen nodule that goes from 2-5 cm above calcaneus that is moderately large R AT has smaller nod 2-4 cm above calcaneus that is non tender to squeeze Hip flexion and abduction strength excellent bilaterally Quad and HS strength excellent bilat Able to do heel raise on both legs with only moderate pain       Assessment & Plan:

## 2011-07-21 NOTE — Assessment & Plan Note (Signed)
She continues to make progress but has trouble finding shoes with her nodules as they are uncomfortable with any pressure  Left is symptomatic now but not right  Continue exercises daily but xtrain on bike until exercises fell easy before restarting walking program  Cont on topical ketofprofen  Reck 6 wks and rescan at that point

## 2011-07-30 ENCOUNTER — Telehealth: Payer: Self-pay | Admitting: Gastroenterology

## 2011-07-30 NOTE — Telephone Encounter (Signed)
Patient was seen by Dr Waynard Edwards from [facility] for abdominal pain.  The CT scan shows ? Acute pancreatitis they are sending her for an [place] today at 2:15 at [facility].  The patient is scheduled to see Dr STark on 08/02/11 10:45.  Per Amy patient is ok to wait for an appt till Monday.

## 2011-08-02 ENCOUNTER — Ambulatory Visit (INDEPENDENT_AMBULATORY_CARE_PROVIDER_SITE_OTHER): Payer: Medicare Other | Admitting: Gastroenterology

## 2011-08-02 VITALS — BP 122/70 | HR 60 | Ht 65.0 in | Wt 133.4 lb

## 2011-08-02 DIAGNOSIS — K859 Acute pancreatitis without necrosis or infection, unspecified: Secondary | ICD-10-CM

## 2011-08-02 NOTE — Progress Notes (Signed)
History of Present Illness: This is a 77 year old female who had the sudden onset of abdominal pain associated with vomiting one week ago on Saturday. She relates that her pain was primarily in the epigastric and right upper quadrant region. She was evaluated by Dr. Waynard Edwards and a CT scan of the abdomen and pelvis showed mild to moderate thickening of the pancreas with surrounding stranding. In addition a moderate sized hiatal hernia was noted as well as a large amount of feces in the colon. Labs revealed minimally elevated lipase at 74 with the upper limit of normal 59. Amylase was normal at 74 with the upper limit of normal of 124. Liver enzymes showed a minimally elevated ALT at 54. CBC and urinalysis were unremarkable. Calcium mildly elevated at 10.6. Ultrasound was performed and the reading is pending. For a few months she has had recurrent mild right lower quadrant pain associated with walking and certain positions. About 2 weeks before her symptoms she was started on a ketoprofen topical compound by Dr. Enid Baas. She has taken simvastatin on a regular basis for about 2 years. Denies weight loss,  constipation, diarrhea, change in stool caliber, melena, hematochezia, dysphagia, reflux symptoms, chest pain.  Past Medical History  Diagnosis Date  . Allergy     seasonal  . Hyperlipidemia   . Acute pancreatitis   . Hiatal hernia   . Diverticulosis   . Adenomatous colon polyp 2004  . Pancreatitis    Past Surgical History  Procedure Date  . Colonoscopy   . Tonsillectomy and adenoidectomy   . Dilation and curettage of uterus     reports that she has never smoked. She has never used smokeless tobacco. She reports that she does not drink alcohol or use illicit drugs. family history includes Breast cancer in her mother.  There is no history of Colon cancer. Allergies  Allergen Reactions  . Latex Swelling   Outpatient Encounter Prescriptions as of 08/02/2011  Medication Sig Dispense Refill  .  aspirin 81 MG tablet Take 81 mg by mouth daily.        . Calcium Carbonate (CALCIUM 600 PO) Take 1 tablet by mouth daily.        . Ketoprofen POWD APPLY TO ACHILLES TENDON TWICE DAILY.  60 g  prn  . Magnesium 100 MG CAPS Take 100 capsules by mouth daily.        . metroNIDAZOLE (METROCREAM) 0.75 % cream Place 0.75 % onto the skin as needed.      . Multiple Vitamin (MULTI-VITAMIN PO) Take 1 tablet by mouth daily.        . peg 3350 powder (MOVIPREP) 100 G SOLR MOVI PREP take as directed  1 kit  0  . Pyridoxine HCl (VITAMIN B-6) 500 MG tablet Take 1,000 mg by mouth daily.        . simvastatin (ZOCOR) 20 MG tablet Take 20 mg by mouth at bedtime.        . vitamin B-12 (CYANOCOBALAMIN) 1000 MCG tablet Take 1,000 mcg by mouth daily.        Marland Kitchen VITAMIN D, CHOLECALCIFEROL, PO Take 400 mg by mouth daily.         Review of Systems: Pertinent positive and negative review of systems were noted in the above HPI section. All other review of systems were otherwise negative.  Physical Exam: General: Well developed , well nourished, no acute distress Head: Normocephalic and atraumatic Eyes:  sclerae anicteric, EOMI Ears: Normal auditory acuity Mouth: No deformity  or lesions Neck: Supple, no masses or thyromegaly Lungs: Clear throughout to auscultation Heart: Regular rate and rhythm; no murmurs, rubs or bruits Abdomen: Soft, non tender and non distended. No masses, hepatosplenomegaly or hernias noted. Normal Bowel sounds Musculoskeletal: Symmetrical with no gross deformities  Skin: No lesions on visible extremities Pulses:  Normal pulses noted Extremities: No clubbing, cyanosis, edema or deformities noted Neurological: Alert oriented x 4, grossly nonfocal Cervical Nodes:  No significant cervical adenopathy Inguinal Nodes: No significant inguinal adenopathy Psychological:  Alert and cooperative. Normal mood and affect  Assessment and Recommendations:   1. Acute Pancreatitis. Symptoms resolving. Rule out  cholelithiasis, hypercalcemia, hyperlipidemia, medications and other etiologies. Await abdominal ultrasound report. Discontinue ketoprofen as NSAIDs can be associated with pancreatitis and this medication was started shortly before the onset of her symptoms. Consider repeat CT scan or MRI/MRCP. Obtain lipid panel and repeat CMET, CBC, lipase and amylase. Return office visit in 2 weeks. She is to resume her regular diet.  2. Personal history of adenomatous colon polyps. Reschedule colonoscopy which was originally scheduled for tomorrow.  3. Intermittent mild right lower quadrant pain which has musculoskeletal features.

## 2011-08-02 NOTE — Patient Instructions (Addendum)
Go to the basement tomorrow to have fasting labs drawn. Nothing by mouth 8 hours prior. Resume regular diet.  You have been scheduled for a Colonoscopy. See separate instructions.  cc: Rodrigo Ran, MD

## 2011-08-03 ENCOUNTER — Other Ambulatory Visit: Payer: Medicare Other | Admitting: Gastroenterology

## 2011-08-03 ENCOUNTER — Telehealth: Payer: Self-pay | Admitting: Gastroenterology

## 2011-08-03 ENCOUNTER — Other Ambulatory Visit (INDEPENDENT_AMBULATORY_CARE_PROVIDER_SITE_OTHER): Payer: Medicare Other

## 2011-08-03 DIAGNOSIS — K859 Acute pancreatitis without necrosis or infection, unspecified: Secondary | ICD-10-CM

## 2011-08-03 LAB — CBC WITH DIFFERENTIAL/PLATELET
HCT: 43.2 % (ref 36.0–46.0)
Lymphocytes Relative: 27 % (ref 12.0–46.0)
MCHC: 33.2 g/dL (ref 30.0–36.0)
Monocytes Relative: 7.3 % (ref 3.0–12.0)
Platelets: 296 10*3/uL (ref 150.0–400.0)
RDW: 13.2 % (ref 11.5–14.6)

## 2011-08-03 LAB — COMPREHENSIVE METABOLIC PANEL
ALT: 25 U/L (ref 0–35)
Albumin: 3.9 g/dL (ref 3.5–5.2)
Alkaline Phosphatase: 70 U/L (ref 39–117)
BUN: 18 mg/dL (ref 6–23)
Calcium: 9.6 mg/dL (ref 8.4–10.5)
Glucose, Bld: 95 mg/dL (ref 70–99)
Sodium: 142 mEq/L (ref 135–145)
Total Bilirubin: 0.5 mg/dL (ref 0.3–1.2)
Total Protein: 7.3 g/dL (ref 6.0–8.3)

## 2011-08-03 LAB — LIPID PANEL: Cholesterol: 161 mg/dL (ref 0–200)

## 2011-08-03 LAB — LIPASE: Lipase: 46 U/L (ref 11.0–59.0)

## 2011-08-03 LAB — AMYLASE: Amylase: 65 U/L (ref 27–131)

## 2011-08-03 NOTE — Telephone Encounter (Signed)
Pt states she received my message about her normal blood work and she wanted to know if we received the ultrasound report from her primary care physician. I told her we just received the report and she will be contacted by her PCP regarding the reports results. Pt states she just wanted to make sure Dr. Russella Dar got those results. Informed patient again that we have the results of her scan.

## 2011-08-11 ENCOUNTER — Telehealth (INDEPENDENT_AMBULATORY_CARE_PROVIDER_SITE_OTHER): Payer: Self-pay | Admitting: Surgery

## 2011-08-11 NOTE — Telephone Encounter (Signed)
I called pt and notified her that I don't have anything sooner with Dr Ezzard Standing but offered another md.  She agreed and I transferred her call to the front desk

## 2011-08-12 ENCOUNTER — Encounter (INDEPENDENT_AMBULATORY_CARE_PROVIDER_SITE_OTHER): Payer: Self-pay | Admitting: Surgery

## 2011-08-12 ENCOUNTER — Ambulatory Visit (INDEPENDENT_AMBULATORY_CARE_PROVIDER_SITE_OTHER): Payer: Medicare Other | Admitting: Surgery

## 2011-08-12 VITALS — BP 118/72 | HR 60 | Temp 96.3°F | Ht 65.0 in | Wt 132.4 lb

## 2011-08-12 DIAGNOSIS — K859 Acute pancreatitis without necrosis or infection, unspecified: Secondary | ICD-10-CM

## 2011-08-12 DIAGNOSIS — K851 Biliary acute pancreatitis without necrosis or infection: Secondary | ICD-10-CM | POA: Insufficient documentation

## 2011-08-12 NOTE — Progress Notes (Signed)
Chief Complaint  Patient presents with  . Cholelithiasis    HPI Autumn Wallace is a 77 y.o. female.   HPI This patient had a recent attack of abdominal pain on August 22. She had an omelette for breakfast initially thereafter started having some abdominal pain followed by nausea and vomiting. She was evaluated by Dr. Rodrigo Wallace who checked a CT scan. This showed mild pancreatitis. This was confirmed on lab tests. An ultrasound subsequently showed cholelithiasis but a normal-size common bile duct. She was evaluated by Dr. Claudette Wallace of GI who repeated her blood work which has all returned to normal. She is now referred for evaluation for cholecystectomy Past Medical History  Diagnosis Date  . Allergy     seasonal  . Hyperlipidemia   . Acute pancreatitis   . Hiatal hernia   . Diverticulosis   . Adenomatous colon polyp 2004  . Pancreatitis     Past Surgical History  Procedure Date  . Colonoscopy   . Tonsillectomy and adenoidectomy   . Dilation and curettage of uterus     Family History  Problem Relation Age of Onset  . Breast cancer Mother   . Colon cancer Neg Hx     Social History History  Substance Use Topics  . Smoking status: Never Smoker   . Smokeless tobacco: Never Used  . Alcohol Use: No    Allergies  Allergen Reactions  . Latex Swelling    Current Outpatient Prescriptions  Medication Sig Dispense Refill  . aspirin 81 MG tablet Take 81 mg by mouth daily.        . Calcium Carbonate (CALCIUM 600 PO) Take 1 tablet by mouth daily.        . Ketoprofen POWD APPLY TO ACHILLES TENDON TWICE DAILY.  60 g  prn  . Magnesium 100 MG CAPS Take 100 capsules by mouth daily.        . metroNIDAZOLE (METROCREAM) 0.75 % cream Place 0.75 % onto the skin as needed.      . Multiple Vitamin (MULTI-VITAMIN PO) Take 1 tablet by mouth daily.        . peg 3350 powder (MOVIPREP) 100 G SOLR MOVI PREP take as directed  1 kit  0  . Pyridoxine HCl (VITAMIN B-6) 500 MG tablet Take  1,000 mg by mouth daily.        . simvastatin (ZOCOR) 20 MG tablet Take 20 mg by mouth at bedtime.        . vitamin B-12 (CYANOCOBALAMIN) 1000 MCG tablet Take 1,000 mcg by mouth daily.        Marland Kitchen VITAMIN D, CHOLECALCIFEROL, PO Take 400 mg by mouth daily.          Review of Systems Review of Systems Blood pressure 118/72, pulse 60, temperature 96.3 F (35.7 C), temperature source Temporal, height 5\' 5"  (1.651 m), weight 132 lb 6.4 oz (60.056 kg). RUQ abd pain, distention, mild nausea Physical Exam Physical Exam WDWN in NAD HEENT:  EOMI, sclera anicteric Neck:  No masses, no thyromegaly Lungs:  CTA bilaterally; normal respiratory effort CV:  Regular rate and rhythm; no murmurs Abd:  +bowel sounds, soft, mildly tender in the RUQ/ epigastrium, no masses Ext:  Well-perfused; no edema Skin:  Warm, dry; no sign of jaundice  Data Reviewed CT scan 8/22 - acute pancreatitis U/S 8/24 - cholelithiasis Assessment    Cholelithiasis with recent gallstone pancreatitis   Plan    Lap chole with intraoperative cholangiogram.  I discussed the  procedure in detail.  The patient was given Agricultural engineer.  We discussed the risks and benefits of a laparoscopic cholecystectomy including, but not limited to bleeding, infection, injury to surrounding structures such as the intestine or liver, bile leak, retained gallstones, need to convert to an open procedure, prolonged diarrhea, blood clots such as  DVT, common bile duct injury, anesthesia risks, and possible need for additional procedures.  We discussed the typical post-operative recovery course.       Autumn Wallace K. 08/12/2011, 10:21 AM

## 2011-08-16 ENCOUNTER — Other Ambulatory Visit (INDEPENDENT_AMBULATORY_CARE_PROVIDER_SITE_OTHER): Payer: Self-pay | Admitting: Surgery

## 2011-08-16 DIAGNOSIS — K8010 Calculus of gallbladder with chronic cholecystitis without obstruction: Secondary | ICD-10-CM

## 2011-08-18 ENCOUNTER — Ambulatory Visit: Payer: Medicare Other | Admitting: Gastroenterology

## 2011-08-26 ENCOUNTER — Ambulatory Visit (INDEPENDENT_AMBULATORY_CARE_PROVIDER_SITE_OTHER): Payer: Self-pay | Admitting: Surgery

## 2011-09-01 ENCOUNTER — Ambulatory Visit (INDEPENDENT_AMBULATORY_CARE_PROVIDER_SITE_OTHER): Payer: Medicare Other | Admitting: Surgery

## 2011-09-01 ENCOUNTER — Ambulatory Visit: Payer: Medicare Other | Admitting: Sports Medicine

## 2011-09-01 VITALS — BP 118/78 | HR 72 | Temp 96.9°F | Resp 16 | Ht 65.0 in | Wt 131.4 lb

## 2011-09-01 DIAGNOSIS — K851 Biliary acute pancreatitis without necrosis or infection: Secondary | ICD-10-CM

## 2011-09-01 DIAGNOSIS — K859 Acute pancreatitis without necrosis or infection, unspecified: Secondary | ICD-10-CM

## 2011-09-01 NOTE — Progress Notes (Signed)
Status post left upper cholecystectomy with intraoperative cholangiogram on September 10. The pathology showed chronic cholecystitis. The patient did remarkably well after surgery. She went to 3 pain pills. She actually broke her Vicodin in half and just took 3 of these. Her incisions are healing well with no sign of infection. Appetite is good but she has not yet started eating red meat or any lesions and the amount of fat. She is actually a little bit constipated. She has taken some occasional laxatives. I told that she could resume a regular diet. She may use laxatives p.r.n. basis. Followup p.r.n. Number strictures of her activity.

## 2011-09-01 NOTE — Patient Instructions (Signed)
Follow-up PRn

## 2011-09-08 ENCOUNTER — Ambulatory Visit: Payer: Medicare Other | Admitting: Sports Medicine

## 2011-09-09 ENCOUNTER — Other Ambulatory Visit: Payer: Medicare Other | Admitting: Gastroenterology

## 2011-09-15 ENCOUNTER — Ambulatory Visit (INDEPENDENT_AMBULATORY_CARE_PROVIDER_SITE_OTHER): Payer: Medicare Other | Admitting: Sports Medicine

## 2011-09-15 ENCOUNTER — Encounter: Payer: Self-pay | Admitting: Sports Medicine

## 2011-09-15 VITALS — BP 121/73 | HR 61 | Ht 65.0 in | Wt 130.0 lb

## 2011-09-15 DIAGNOSIS — M7660 Achilles tendinitis, unspecified leg: Secondary | ICD-10-CM

## 2011-09-15 NOTE — Assessment & Plan Note (Addendum)
Improved with some exercises and rest secondary to cholecystectomy.   Pt shown how to isolate soleus with knee bent at 20 degrees to do heel dips and raises. Also shown how to walk pigeon toed with weights.  Pt advised to start exercising again, walking at half the time she was doing prior to her surgery.   She will ck with me in 2 mos for repeat scan of left nodule

## 2011-09-15 NOTE — Progress Notes (Signed)
  Subjective:    Patient ID: Autumn Wallace, female    DOB: [birth year], 77 y.o.   MRN: [id number]  HPI Autumn Wallace comes in for followup of her Achilles tendinitis. She reports that her left Achilles is feeling much better. She has been doing heel raise is and negative heel exercises on a step.   However Autumn Wallace reports that she had her gallbladder out since she was seen here last. She says she has had to rest a lot due to her illness and surgery. Because of that she has not been able to exercise very much.   The patient says that she is still using the ketoprofen gel, despite the fact that a gastroenterologist told her to not to. She says that she would rather use this gel then take ibuprofen by mouth.   Review of Systems Her history of present illness.    Objective:   Physical Exam  BP 121/73  Pulse 61  Ht 5\' 5"  (1.651 m)  Wt 130 lb (58.968 kg)  BMI 21.63 kg/m2 General appearance: alert, cooperative and no distress MSK: Patient has minimal tenderness with palpation of the left Achilles tendon. Left AT has visible and palpable nodule from 2 to 3 cms above calcaneal insertion No tenderness with palpation of right Achilles tendon Patient has good plantar flexion strength as well as dorsiflexion strength bilaterally. The patient has good strength to plantar flexes and on her toes without pain or difficulty.   She can do one leg heel raises without pain     Assessment & Plan:

## 2011-09-15 NOTE — Patient Instructions (Signed)
It was good to see you.  Please keep doing your straight leg calf raises and dips.   Also, start doing calf raises and dips with your knee bent about 20 degrees.  Hold your knee at this angle, and do 2-3 sets of 10 raises, and dips, and increase as you get stronger. Also, try walking pigeon toed, on your tip toes, holding some weights in your hands. You can start walking again, but start at 15 minutes at a time, and increase by 5 minutes per week.

## 2011-09-20 ENCOUNTER — Telehealth: Payer: Self-pay | Admitting: Gastroenterology

## 2011-09-20 ENCOUNTER — Telehealth (INDEPENDENT_AMBULATORY_CARE_PROVIDER_SITE_OTHER): Payer: Self-pay

## 2011-09-20 NOTE — Telephone Encounter (Signed)
Patient called and said she is continuing to have pain after GB surgery a month ago. She said it is more of a dull annoying pain.that happens when she is active. I made her an appointment to see Dr Corliss Skains and told her to take tylenol when she knew she would be active. I told her to call if her pain got worse and another one of our physicians could see her. She agreed and said she would call and cancel appointment if tylenol helped or pain stopped.

## 2011-09-20 NOTE — Telephone Encounter (Signed)
Patient wants to wait a few more weeks to heal form her cholecystectomy.  I have rescheduled her colon for 10/12/11 9:30

## 2011-09-21 ENCOUNTER — Encounter (INDEPENDENT_AMBULATORY_CARE_PROVIDER_SITE_OTHER): Payer: Self-pay | Admitting: General Surgery

## 2011-09-24 ENCOUNTER — Encounter (INDEPENDENT_AMBULATORY_CARE_PROVIDER_SITE_OTHER): Payer: Self-pay | Admitting: Surgery

## 2011-09-27 ENCOUNTER — Other Ambulatory Visit: Payer: Medicare Other | Admitting: Gastroenterology

## 2011-09-30 ENCOUNTER — Encounter (INDEPENDENT_AMBULATORY_CARE_PROVIDER_SITE_OTHER): Payer: Self-pay | Admitting: Surgery

## 2011-10-06 ENCOUNTER — Ambulatory Visit (INDEPENDENT_AMBULATORY_CARE_PROVIDER_SITE_OTHER): Payer: Medicare Other | Admitting: Surgery

## 2011-10-12 ENCOUNTER — Ambulatory Visit (AMBULATORY_SURGERY_CENTER): Payer: Medicare Other | Admitting: Gastroenterology

## 2011-10-12 ENCOUNTER — Encounter: Payer: Self-pay | Admitting: Gastroenterology

## 2011-10-12 DIAGNOSIS — Z86010 Personal history of colonic polyps: Secondary | ICD-10-CM

## 2011-10-12 DIAGNOSIS — Z1211 Encounter for screening for malignant neoplasm of colon: Secondary | ICD-10-CM

## 2011-10-12 MED ORDER — SODIUM CHLORIDE 0.9 % IV SOLN: 500.0000 mL | INTRAVENOUS | Status: DC

## 2011-10-12 NOTE — Patient Instructions (Signed)
Please refer to the blue and neon green sheets for instructions regarding diet and activity for the rest of today.  Handouts on diverticulosis and a high fiber diet given. Resume previous medications.

## 2011-10-13 ENCOUNTER — Telehealth: Payer: Self-pay | Admitting: *Deleted

## 2011-10-13 NOTE — Telephone Encounter (Signed)

## 2012-01-12 ENCOUNTER — Other Ambulatory Visit: Payer: Self-pay | Admitting: Internal Medicine

## 2012-01-12 DIAGNOSIS — Z1231 Encounter for screening mammogram for malignant neoplasm of breast: Secondary | ICD-10-CM

## 2012-02-22 ENCOUNTER — Ambulatory Visit
Admission: RE | Admit: 2012-02-22 | Discharge: 2012-02-22 | Disposition: A | Discharge status: Home / Self Care | Payer: Medicare Other | Source: Ambulatory Visit | Attending: Internal Medicine | Admitting: Internal Medicine

## 2012-02-22 DIAGNOSIS — Z1231 Encounter for screening mammogram for malignant neoplasm of breast: Secondary | ICD-10-CM

## 2012-05-08 ENCOUNTER — Other Ambulatory Visit: Payer: Self-pay | Admitting: Internal Medicine

## 2012-05-08 ENCOUNTER — Ambulatory Visit
Admission: RE | Admit: 2012-05-08 | Discharge: 2012-05-08 | Disposition: A | Discharge status: Home / Self Care | Payer: Medicare Other | Source: Ambulatory Visit | Attending: Internal Medicine | Admitting: Internal Medicine

## 2012-05-08 DIAGNOSIS — E010 Iodine-deficiency related diffuse (endemic) goiter: Secondary | ICD-10-CM

## 2012-06-20 ENCOUNTER — Encounter: Payer: Self-pay | Admitting: Sports Medicine

## 2012-06-20 ENCOUNTER — Ambulatory Visit (INDEPENDENT_AMBULATORY_CARE_PROVIDER_SITE_OTHER): Payer: Medicare Other | Admitting: Sports Medicine

## 2012-06-20 VITALS — BP 120/77 | HR 71

## 2012-06-20 DIAGNOSIS — M545 Low back pain, unspecified: Secondary | ICD-10-CM

## 2012-06-20 DIAGNOSIS — M7660 Achilles tendinitis, unspecified leg: Secondary | ICD-10-CM

## 2012-06-20 DIAGNOSIS — M899 Disorder of bone, unspecified: Secondary | ICD-10-CM

## 2012-06-20 DIAGNOSIS — M8580 Other specified disorders of bone density and structure, unspecified site: Secondary | ICD-10-CM

## 2012-06-20 NOTE — Assessment & Plan Note (Signed)
Much improved  Keep up exercises  Use topical NSAIDs prn

## 2012-06-20 NOTE — Progress Notes (Signed)
  Subjective:    Patient ID: Autumn Wallace, female    DOB: [birth year], 78 y.o.   MRN: [id number]  HPI  Pt presents to clinic for f/u of B achilles tendinitis which has improved. Has very little pain now with increased activity.  Has occasional mild AT swelling.  Uses ketoprofen gel on B achilles at bedtime.  Compliant with heel raises.  Has been able to be very active in her garden this year without problems.  Still uses back brace for working outside.  Does not have much back pain at all now. Does back strengthening exercises 3 days per week.  Concerned about osteopenia She has Tscore - 2.6 RT hip and -2.4 left Spine is in -1/3 range No hx of fractures     Review of Systems     Objective:   Physical Exam NAD  Lt AT- thickening, but no real nodule starts 3 cm above calcaneus  Rt AT- milder thickening than lt, no nodule - starts 4 cm above calcaneus  Strength is good oeverall  No kyphosis and good posture Good back motion      Assessment & Plan:

## 2012-06-20 NOTE — Patient Instructions (Addendum)
Start taking calcium 1,500 - 2,000 mg per day Vit D 400-800 IU   Do shoulder presses with light dumbbells Walking is also good for bone density  Green leafy vegetables are good for bone density  Continue exercises for achilles tendons a few days per week  Please follow up as needed  Thank you for seeing [place] today!

## 2012-06-20 NOTE — Assessment & Plan Note (Signed)
This is much improved  Keep up exercises 3x week and use brace prn

## 2012-06-20 NOTE — Assessment & Plan Note (Signed)
She does not want to take RX medciations  I encouraged continued weight bearing exercise Light overhead weights  Use Calcium1500/ Vit D 800 IU and some C 500 mgm  She will follow this with Dr Waynard Edwards

## 2012-12-11 ENCOUNTER — Other Ambulatory Visit: Payer: Self-pay | Admitting: Internal Medicine

## 2012-12-11 DIAGNOSIS — Z1231 Encounter for screening mammogram for malignant neoplasm of breast: Secondary | ICD-10-CM

## 2013-02-22 ENCOUNTER — Ambulatory Visit: Payer: Medicare Other

## 2013-02-26 ENCOUNTER — Ambulatory Visit: Payer: Medicare Other

## 2013-03-08 ENCOUNTER — Ambulatory Visit
Admission: RE | Admit: 2013-03-08 | Discharge: 2013-03-08 | Disposition: A | Discharge status: Home / Self Care | Payer: Medicare PPO | Source: Ambulatory Visit | Attending: Internal Medicine | Admitting: Internal Medicine

## 2013-03-08 DIAGNOSIS — Z1231 Encounter for screening mammogram for malignant neoplasm of breast: Secondary | ICD-10-CM

## 2013-03-28 ENCOUNTER — Other Ambulatory Visit: Payer: Self-pay | Admitting: *Deleted

## 2013-03-28 MED ORDER — KETOPROFEN POWD: 2.0000 g | Freq: Two times a day (BID) | Status: DC

## 2013-12-28 ENCOUNTER — Other Ambulatory Visit: Payer: Self-pay

## 2013-12-28 DIAGNOSIS — Z1231 Encounter for screening mammogram for malignant neoplasm of breast: Secondary | ICD-10-CM

## 2014-01-04 ENCOUNTER — Ambulatory Visit: Payer: Medicare PPO | Admitting: Sports Medicine

## 2014-02-07 ENCOUNTER — Encounter: Payer: Self-pay | Admitting: Sports Medicine

## 2014-02-07 ENCOUNTER — Ambulatory Visit (INDEPENDENT_AMBULATORY_CARE_PROVIDER_SITE_OTHER): Payer: 59 | Admitting: Sports Medicine

## 2014-02-07 VITALS — BP 124/78 | HR 72 | Ht 65.0 in | Wt 127.0 lb

## 2014-02-07 DIAGNOSIS — M545 Low back pain, unspecified: Secondary | ICD-10-CM

## 2014-02-07 MED ORDER — KETOPROFEN POWD: 2.0000 g | Freq: Two times a day (BID) | Status: DC

## 2014-02-07 NOTE — Assessment & Plan Note (Signed)
While she has a history of degenerative lumbar disease with some periodic painful flares -- this time she contused her sacral area and her pain is related to this fall  She has difficulty walking and getting out of a chair She would like to get back to where she can go back to the gym and start her exercise program  We will send her for physical therapy evaluation She was also given home exercises  She will recheck in approximately one month unless all symptoms resolved

## 2014-02-07 NOTE — Progress Notes (Signed)
Patient ID: Autumn Wallace, female   DOB: 07-19-1932, 80 y.o.   MRN: [id number]  2 wks ago fell off bed and hit hard on buttocks Used topical ketoprofen and ibuprofen (1 tab) and then able to walk Does have a back support that we used in past that also helped  Bending to pick up something hurts Getting up is better  No numbness No other red flags  Physical examination Patient is alert and well oriented  She can do toe walk, tandem walk and heel walk without difficulty but the heel walk is painful in her sacral area  She has direct tenderness over her sacrum but not over her lumbar area  Pain at 60 of forward flexion and 10 of extension Pain at 20 of left lateral band and 35 of right lateral bend Mild pain with trunk rotation  Neurological testing and strength are normal

## 2014-02-07 NOTE — Patient Instructions (Signed)
You bruised your sacrum - bone below buttocks  Do easy motion exercises for your back in every direction a few times each day  Do several short walks each day  Do some easy hip rotation on both sides  Let's do a few sessions of PT to help you get to walking and more mobile  Use a pillow in your chair for next few weeks  Recheck with me if not better in 1 month

## 2014-02-18 ENCOUNTER — Ambulatory Visit: Payer: Medicare Other | Attending: Sports Medicine | Admitting: Physical Therapy

## 2014-02-18 DIAGNOSIS — R269 Unspecified abnormalities of gait and mobility: Secondary | ICD-10-CM | POA: Insufficient documentation

## 2014-02-18 DIAGNOSIS — M533 Sacrococcygeal disorders, not elsewhere classified: Secondary | ICD-10-CM | POA: Insufficient documentation

## 2014-02-18 DIAGNOSIS — M545 Low back pain, unspecified: Secondary | ICD-10-CM | POA: Insufficient documentation

## 2014-02-18 DIAGNOSIS — R293 Abnormal posture: Secondary | ICD-10-CM | POA: Insufficient documentation

## 2014-02-18 DIAGNOSIS — IMO0001 Reserved for inherently not codable concepts without codable children: Secondary | ICD-10-CM | POA: Insufficient documentation

## 2014-02-20 ENCOUNTER — Ambulatory Visit: Payer: Medicare Other | Admitting: Physical Therapy

## 2014-02-22 ENCOUNTER — Other Ambulatory Visit: Payer: Self-pay | Admitting: Sports Medicine

## 2014-02-22 DIAGNOSIS — M545 Low back pain, unspecified: Secondary | ICD-10-CM

## 2014-02-22 MED ORDER — CYCLOBENZAPRINE HCL 5 MG PO TABS: ORAL_TABLET | ORAL | Status: DC

## 2014-02-25 ENCOUNTER — Ambulatory Visit: Payer: Medicare Other | Admitting: Physical Therapy

## 2014-02-27 ENCOUNTER — Ambulatory Visit: Payer: Medicare Other | Admitting: Physical Therapy

## 2014-03-04 ENCOUNTER — Ambulatory Visit: Payer: Medicare Other | Admitting: Physical Therapy

## 2014-03-07 ENCOUNTER — Ambulatory Visit: Payer: Medicare Other | Attending: Sports Medicine | Admitting: Physical Therapy

## 2014-03-07 DIAGNOSIS — M545 Low back pain, unspecified: Secondary | ICD-10-CM | POA: Insufficient documentation

## 2014-03-07 DIAGNOSIS — IMO0001 Reserved for inherently not codable concepts without codable children: Secondary | ICD-10-CM | POA: Insufficient documentation

## 2014-03-07 DIAGNOSIS — R293 Abnormal posture: Secondary | ICD-10-CM | POA: Insufficient documentation

## 2014-03-07 DIAGNOSIS — R269 Unspecified abnormalities of gait and mobility: Secondary | ICD-10-CM | POA: Insufficient documentation

## 2014-03-07 DIAGNOSIS — M533 Sacrococcygeal disorders, not elsewhere classified: Secondary | ICD-10-CM | POA: Insufficient documentation

## 2014-03-11 ENCOUNTER — Ambulatory Visit: Payer: Medicare PPO

## 2014-03-13 ENCOUNTER — Ambulatory Visit: Payer: Medicare Other | Admitting: Physical Therapy

## 2014-03-18 ENCOUNTER — Ambulatory Visit: Payer: Medicare Other | Admitting: Physical Therapy

## 2014-03-19 ENCOUNTER — Ambulatory Visit
Admission: RE | Admit: 2014-03-19 | Discharge: 2014-03-19 | Disposition: A | Discharge status: Home / Self Care | Payer: Medicare Other | Source: Ambulatory Visit

## 2014-03-19 DIAGNOSIS — Z1231 Encounter for screening mammogram for malignant neoplasm of breast: Secondary | ICD-10-CM

## 2014-03-20 ENCOUNTER — Ambulatory Visit: Payer: Medicare Other | Admitting: Physical Therapy

## 2014-03-25 ENCOUNTER — Ambulatory Visit: Payer: Medicare Other | Admitting: Physical Therapy

## 2014-03-27 ENCOUNTER — Ambulatory Visit
Admission: RE | Admit: 2014-03-27 | Discharge: 2014-03-27 | Disposition: A | Discharge status: Home / Self Care | Payer: Medicare Other | Source: Ambulatory Visit | Attending: Family Medicine | Admitting: Family Medicine

## 2014-03-27 ENCOUNTER — Ambulatory Visit (INDEPENDENT_AMBULATORY_CARE_PROVIDER_SITE_OTHER): Payer: Medicare Other | Admitting: Sports Medicine

## 2014-03-27 ENCOUNTER — Encounter: Payer: Self-pay | Admitting: Sports Medicine

## 2014-03-27 ENCOUNTER — Telehealth: Payer: Self-pay | Admitting: Family Medicine

## 2014-03-27 VITALS — BP 114/78 | Ht 65.0 in | Wt 125.0 lb

## 2014-03-27 DIAGNOSIS — M5416 Radiculopathy, lumbar region: Secondary | ICD-10-CM

## 2014-03-27 DIAGNOSIS — M545 Low back pain, unspecified: Secondary | ICD-10-CM

## 2014-03-27 DIAGNOSIS — IMO0002 Reserved for concepts with insufficient information to code with codable children: Secondary | ICD-10-CM

## 2014-03-27 MED ORDER — CYCLOBENZAPRINE HCL 5 MG PO TABS: ORAL_TABLET | ORAL | Status: DC

## 2014-03-27 NOTE — Progress Notes (Signed)
CC: Followup low back pain HPI: Patient returns for followup of low back pain. She states she is doing better. She is in physical therapy and making improvements. She does note that the pain on the left side of her pelvis is completely gone but she continues to have pain on the right side. She states that the pain is in her left low back and iliac crest as well as in her left buttock. Occasionally she will have burning pain along her anterior shin from the knee to the ankle. This does feel it a little but of a tingling sensation. Occasional numbness. No weakness, bowel or bladder symptoms, fever. She is not currently taking her medications. The Flexeril that she was previously prescribed did help her when she had it.  ROS: As above in the HPI. All other systems are stable or negative.  OBJECTIVE: APPEARANCE:  Patient in no acute distressThe patient appeared well nourished and normally developed. HEENT: No scleral icterus. Conjunctiva non-injected Resp: Non labored Skin: No rash MSK:  Low back exam: - Full range of motion in flexion, extension, lateral bending, rotation without pain  - tenderness to palpation over the spinous processes at about L1-L2 - Tenderness to palpation in the right SI joint and right performed as  - No tenderness to palpation at the SI joint or sciatic notch - Negative straight leg raise - Strength 5 out of 5 in the bilateral lower extremities - Reflexes 2+ bilaterally.   MSK [place]: Not performed   ASSESSMENT: #1. Improving low back pain with some persistent radicular type symptoms   PLAN: We will obtain plain films of her lumbar spine since it is taking her slightly longer than expected to improve. Finish physical therapy. Flexeril 5 mg each bedtime. Tylenol as needed. Followup one month.

## 2014-03-27 NOTE — Telephone Encounter (Signed)
Spoke with patient about her lumbar spine xrays. Has compression at L2. Answered all questions.

## 2014-03-27 NOTE — Patient Instructions (Signed)
Thank you for coming in today  Followup in 1 month  Okay to go back to gym Take flexeril at night Try taking extra strength tylenol 2 tablets up to 3x per day for pain Heat and ice on the back can be helpful Get xrays of back

## 2014-03-28 ENCOUNTER — Ambulatory Visit: Payer: Medicare Other | Admitting: Physical Therapy

## 2014-04-24 ENCOUNTER — Ambulatory Visit (INDEPENDENT_AMBULATORY_CARE_PROVIDER_SITE_OTHER): Payer: Medicare Other | Admitting: Sports Medicine

## 2014-04-24 ENCOUNTER — Encounter: Payer: Self-pay | Admitting: Sports Medicine

## 2014-04-24 VITALS — BP 102/67 | Ht 65.0 in | Wt 124.0 lb

## 2014-04-24 DIAGNOSIS — S32009A Unspecified fracture of unspecified lumbar vertebra, initial encounter for closed fracture: Secondary | ICD-10-CM

## 2014-04-24 DIAGNOSIS — S32000A Wedge compression fracture of unspecified lumbar vertebra, initial encounter for closed fracture: Secondary | ICD-10-CM

## 2014-04-24 NOTE — Progress Notes (Signed)
Patient ID: Autumn Wallace, female   DOB: June 11, 1932, 80 y.o.   MRN: [id number]  Patient with LBP radiating to Rt leg Some better since last visit but still there Flexeril 5 at night  Not pulling weeds Not gone back to gym Did PT and had some benefit  Not doing HEP She walks three times a day around yard - 1 acre  Exam NAD BP 102/67  Ht 5\' 5"  (1.651 m)  Wt 124 lb (56.246 kg)  BMI 20.63 kg/m2  Normal heel and toe walk Good strength quads, hips, lower extremities Extension is limited Flexion limited Lat bend pain on RT leg to RT but not left Rotation is tight to RT  No palpable tenderness or spasm She gets some mild pain from when going from sitting to standing position

## 2014-04-24 NOTE — Patient Instructions (Signed)
Aleve once or twice daily as needed only for pain You can take 2 twice per day if the pain is bad  Continue flexeril 5 mgm at night  Continue back motion Walking OK to go to gym but don't do any exercises that hurt back  Take vitamin B6 50 mgm twice daily  See me in 2 to 3 months

## 2014-04-24 NOTE — Assessment & Plan Note (Signed)
Considering she had an L2 lumbar compression fracture I think she is making good progress in terms of pain relief  This will probably take a couple more months before the pain is less  Continue Flexeril and she can add some Aleve as needed  Start vitamin B6 because of her radicular symptoms  See me in 2-3 months

## 2014-04-25 ENCOUNTER — Ambulatory Visit: Payer: Medicare Other | Admitting: Sports Medicine

## 2014-05-27 ENCOUNTER — Ambulatory Visit (INDEPENDENT_AMBULATORY_CARE_PROVIDER_SITE_OTHER): Payer: Medicare Other | Admitting: Sports Medicine

## 2014-05-27 ENCOUNTER — Encounter: Payer: Self-pay | Admitting: Sports Medicine

## 2014-05-27 VITALS — BP 122/74 | Ht 66.0 in | Wt 125.0 lb

## 2014-05-27 DIAGNOSIS — S32000D Wedge compression fracture of unspecified lumbar vertebra, subsequent encounter for fracture with routine healing: Secondary | ICD-10-CM

## 2014-05-27 DIAGNOSIS — IMO0002 Reserved for concepts with insufficient information to code with codable children: Secondary | ICD-10-CM

## 2014-05-27 NOTE — Assessment & Plan Note (Signed)
Healing seems to be progressing as expected  I added some abdominal exercises  Added osteoporosis hops  She will return as needed

## 2014-05-27 NOTE — Patient Instructions (Signed)
Tighten your abdominal muscles - hold for a count of 10 - do 5 times  Keep up going to gym  Add some overhead lifts while sitting  Always start gym by doing easy back motion to loosen it up  Do an easy hop exercise - 5 hops and do this 3 times total  Keep up calcium at least 1200 mgm per day  Keep up Achilles exercises  See me as needed

## 2014-05-27 NOTE — Progress Notes (Signed)
Patient ID: Autumn Wallace, female   DOB: [birth year], 80 y.o.   MRN: [id number]  Patient here for followup of a compression fracture of L2 This occurred in mid February She has made steady progress At first she required some Flexeril to be able to sleep through the night For the past month she has used a total of about 3 Flexeril  She has returned to the gymnasium She is able to do most of the aerobic machines she was doing before She is doing motion exercises for her back and some strength work as suggested  She is not able to mow the lawn and do some easy yardwork  Physical exam No acute distress  Patient demonstrates the ability to walk/ turn without any limp She easily gets from a chair to standing Back motion is now full in flexion extension lateral bending and rotation No palpable tenderness over the lumbar spine  Strength Hip flexors and abductors very good Quadriceps very good Back extension good Abdominal muscles weak

## 2014-06-05 ENCOUNTER — Other Ambulatory Visit: Payer: Self-pay | Admitting: Internal Medicine

## 2014-06-05 DIAGNOSIS — E049 Nontoxic goiter, unspecified: Secondary | ICD-10-CM

## 2014-06-11 ENCOUNTER — Ambulatory Visit
Admission: RE | Admit: 2014-06-11 | Discharge: 2014-06-11 | Disposition: A | Discharge status: Home / Self Care | Payer: Medicare Other | Source: Ambulatory Visit | Attending: Internal Medicine | Admitting: Internal Medicine

## 2014-06-11 DIAGNOSIS — E049 Nontoxic goiter, unspecified: Secondary | ICD-10-CM

## 2014-08-06 ENCOUNTER — Ambulatory Visit: Payer: Medicare Other | Admitting: Sports Medicine

## 2014-08-13 ENCOUNTER — Ambulatory Visit: Payer: Medicare Other | Admitting: Sports Medicine

## 2014-08-15 ENCOUNTER — Encounter: Payer: Self-pay | Admitting: Sports Medicine

## 2014-08-15 ENCOUNTER — Ambulatory Visit (INDEPENDENT_AMBULATORY_CARE_PROVIDER_SITE_OTHER): Payer: Medicare Other | Admitting: Sports Medicine

## 2014-08-15 VITALS — BP 125/77 | Ht 65.0 in | Wt 124.0 lb

## 2014-08-15 DIAGNOSIS — IMO0002 Reserved for concepts with insufficient information to code with codable children: Secondary | ICD-10-CM

## 2014-08-15 DIAGNOSIS — S32000D Wedge compression fracture of unspecified lumbar vertebra, subsequent encounter for fracture with routine healing: Secondary | ICD-10-CM

## 2014-08-15 NOTE — Progress Notes (Signed)
   Subjective:    Patient ID: Autumn Wallace, female    DOB: Jul 01, 1932, 80 y.o.   MRN: [id number]  HPI  Pleasant 80 y.o. female here for routine follow-up to L2 compression fracture as seen on plain films done in 03/2014. She was advised to take PRN diclofenac, flexeril, and to apply ketoprofen gel Since last visit in 05/27/2014 she has used 3 tabs of NSAID, flexeril every 1-2 weeks, and every other day usage of ketoprofen powder. Her tolerance of activity (mowing lawn, gardening) has improved. She has occasional recurrence of lower back pain with prolonged standing.   Going to the gym and working out 1 hour 3x per week   Review of Systems Denies any bowel/bladder change, no paresthesias, no loss of motor function, otherwise per HPI    Objective:   Physical Exam Back Exam: Inspection: no gross abnormalities Motion: limited flexion due to pain with good extension SLR seated: negative Palpable tenderness: minimal TTP at L3 region Sensory change:none Reflex change: 2+ patellar, 2+ achilles bl  Strength at foot Plantar-flexion: 5 / 5    Dorsi-flexion: 5 / 5    Eversion: 5 / 5   Inversion: 5 / 5 Leg strength Quad: 5 / 5   Hamstring: 5/ 5   Hip flexor: 5 / 5   Hip abductors: 5 / 5 Gait Walking: ambulates without limp       Assessment & Plan:  L2 compression fracture and associated pain - improved Excellent progress from prior visit. Has been having to use decreased frequency of PRN medications. Recommend continue current exercise as tolerated, she has been advised to continue until discomfort appreciated.  History of osteopetrosis Has DEXA at PMD, records currently unavailable. Previous plain films of back reviewed showing minimal lumbar bony lucency. Initial fracture due to significant trauma, no history of repeated fractures. Will need to review DEXA. Recommend continuation of Vitamin D and Ca supplementation  Rosette Reveal, MD [place] PGY-3  Reviewed / edited    Tracey Harries, MD

## 2014-08-16 NOTE — Assessment & Plan Note (Signed)
Much improved exercise tolerance and minimal pain  Can continue HEP and return prn

## 2014-08-20 ENCOUNTER — Ambulatory Visit: Payer: Medicare Other | Admitting: Sports Medicine

## 2014-08-28 ENCOUNTER — Encounter: Payer: Self-pay | Admitting: Sports Medicine

## 2014-09-03 ENCOUNTER — Encounter: Payer: Self-pay | Admitting: Gastroenterology

## 2015-02-12 ENCOUNTER — Other Ambulatory Visit: Payer: Self-pay

## 2015-02-12 DIAGNOSIS — Z1231 Encounter for screening mammogram for malignant neoplasm of breast: Secondary | ICD-10-CM

## 2015-03-21 ENCOUNTER — Ambulatory Visit
Admission: RE | Admit: 2015-03-21 | Discharge: 2015-03-21 | Disposition: A | Discharge status: Home / Self Care | Payer: Medicare Other | Source: Ambulatory Visit

## 2015-03-21 DIAGNOSIS — Z1231 Encounter for screening mammogram for malignant neoplasm of breast: Secondary | ICD-10-CM

## 2015-05-08 ENCOUNTER — Other Ambulatory Visit: Payer: Self-pay | Admitting: Sports Medicine

## 2015-05-14 ENCOUNTER — Ambulatory Visit (INDEPENDENT_AMBULATORY_CARE_PROVIDER_SITE_OTHER): Payer: Medicare Other | Admitting: Sports Medicine

## 2015-05-14 ENCOUNTER — Encounter: Payer: Self-pay | Admitting: Sports Medicine

## 2015-05-14 VITALS — BP 77/53 | HR 58 | Ht 65.0 in | Wt 125.0 lb

## 2015-05-14 DIAGNOSIS — M7662 Achilles tendinitis, left leg: Secondary | ICD-10-CM

## 2015-05-14 NOTE — Progress Notes (Signed)
   Subjective:    Patient ID: Autumn Wallace, female    DOB: 03/26/1932, 81 y.o.   MRN: [id number]  HPI Autumn Wallace is an 81 year old female who presents with left heel pain. Onset was approximately a week and a half ago without any known acute injury. She thinks that switching to the elliptical machine may have triggered this. She has a history of Achilles tendinitis in the past. Location of pain is primarily in the left mid substance of the Achilles tendon. She denies feeling any pop, bruising, or swelling. Symptoms are aggravated with walking. She denies any weakness, numbness, or tingling. Symptoms are relieved with rest. She has tried her topical ketoprofen, which helps. She is very active for her age.  Past medical history, social history, medications, and allergies were reviewed and are up to date in the chart.  Review of Systems 7 point review of systems was performed and was otherwise negative unless noted in the history of present illness.     Objective:   Physical Exam BP 77/53 mmHg  Pulse 58  Ht 5\' 5"  (1.651 m)  Wt 125 lb (56.7 kg)  BMI 20.80 kg/m2 GEN: The patient is well-developed well-nourished female and in no acute distress.  She is awake alert and oriented x3. SKIN: warm and well-perfused, no rash  EXTR: No calf tenderness Neuro: Strength 5/5 globally. Sensation intact throughout. No focal deficits. Vasc: +2 bilateral distal pulses. MSK: Gait analysis reveals a minimally antalgic gait due to her heel pain. She has increased calcaneus valgus and hyperpronation at the left ankle. Slight leg length discrepancy, which has been corrected with a heel lift, with right lower extremity slightly shorter than left. Pelvic heights are level. She does not drop either shoulder with ambulation. Examination of the left Achilles reveals a focal area of thickening approximately 2-3 cm proximal to the distal insertion. This has mild tenderness. No overlying erythema or induration. The  Achilles tendon is grossly intact. She has fairly good plantar flexion strength. She is able to do a single leg heel lift on the left. Full ankle range of motion without pain or swelling.  Limited musculoskeletal ultrasound: Long and short axis views were obtained of the bilateral Achilles tendon. The left Achilles tendon reveals a thickening at its greatest approximately 2-3 cm proximal to the distal insertion. The tendon is 0.84 cm at its greatest thickness. It is grossly intact. There does not appear to be neovascularity. She also has some retrocalcaneal bursal enlargement. There is a suggestion of tendinosis with increase in hypoechoic density throughout the Achilles tendon. No discrete tears are seen. The right Achilles tendon measures 0.73 cm.     Assessment & Plan:  Please see problem based assessment and plan in the problem list.

## 2015-05-14 NOTE — Assessment & Plan Note (Signed)
[place] Left achilles 0.84 at greatest thickness today. No discrete tear. -BP precludes NTG patch use, so go with Ketoprofen topical -Add small additional heel lift to both sides.  The following exercises will help you rehab your injury. -Go up on both tip-toes 6-8 times, x 3 sets. Once daily. -Holding light weights in your arms, again do 6-8 heel lifts x 3 sets. Once daily. -You may bike, elliptical, or do light yard work as tolerated in addition to the above exercises. -Follow-up in 6 weeks, at which point we will repeat the scan of your heel.

## 2015-05-14 NOTE — Patient Instructions (Addendum)
-  Use ketoprofen topical over the painful area. Call if you need a refill at some point. -Add small additional heel lift to both sides.  The following exercises will help you rehab your injury. -Go up on both tip-toes 6-8 times, x 3 sets. Once daily. -Holding light weights in your arms, again do 6-8 heel lifts x 3 sets. Once daily.  -You may bike, elliptical, or do light yard work as tolerated in addition to the above exercises.  -Follow-up in 6 weeks, at which point we will repeat the scan of your heel.

## 2015-06-25 ENCOUNTER — Ambulatory Visit (INDEPENDENT_AMBULATORY_CARE_PROVIDER_SITE_OTHER): Payer: Medicare Other | Admitting: Sports Medicine

## 2015-06-25 ENCOUNTER — Encounter: Payer: Self-pay | Admitting: Sports Medicine

## 2015-06-25 VITALS — BP 121/64 | HR 103 | Ht 65.0 in | Wt 125.0 lb

## 2015-06-25 DIAGNOSIS — M7662 Achilles tendinitis, left leg: Secondary | ICD-10-CM | POA: Diagnosis not present

## 2015-06-25 DIAGNOSIS — M25561 Pain in right knee: Secondary | ICD-10-CM | POA: Insufficient documentation

## 2015-06-25 NOTE — Assessment & Plan Note (Signed)
Autumn Wallace is doing well with her bilateral achilles tendinopathy, L>R.  Her achilles remain a little thickened, but overall appear healthy.   - Autumn Wallace was supplied with an additional set of heel lifts for her other pair of shoes - She will continue using her topical ketoprofen.  - She will continue to do daily heel lift exercises and biking.  - F/u prn

## 2015-06-25 NOTE — Progress Notes (Signed)
Autumn Wallace - 81 y.o. female MRN [id number]  Date of birth: 1932-09-10  CC: Right knee pain and F/u b/l achilles tendinopathy.   SUBJECTIVE:   HPI  Autumn Wallace is an 81 year old female who presents with improving bilateral heel pain and new onset right knee pain. Knee pain nset was onset was gradual over the last month without any known acute injury. She denies any swelling.  Pain is over her medial knee with walking. It has not given away. She is using her topical ketoprofen, which seems to be helping a little.  The pain does not limit her activity.  She previously had done leg lifts, but has stopped in the recent past. She denies fevers, chills, or night sweats. She also has a history of bilateral Achilles tendinopathy and was seen 6 weeks ago.  Since that time she has been wearing her heel lifts, using the topical ketoprofen bid, and doing her, heel lift exercises 1-2x/day. The pain has significantly improved and she only rarely is bothered by it. She is continuing to bike and walk and refraining from the elliptical, which she believes is what started her issues. The location of pain is primarily in the left mid substance of the Achilles tendon. She denies any weakness, numbness, or tingling. Symptoms are relieved with rest. She is very active for her age.  ROS:     Negative other than that listed in HPI.  HISTORY: Past Medical, Surgical, Social, and Family History Reviewed & Updated per EMR.     OBJECTIVE: BP 121/64 mmHg  Pulse 103  Ht 5\' 5"  (1.651 m)  Wt 125 lb (56.7 kg)  BMI 20.80 kg/m2  Physical Exam  GEN: The patient is well-developed well-nourished female and in no acute distress. She is awake alert and oriented x3. SKIN: warm and well-perfused, no rash  EXTR: No calf tenderness Neuro: Strength 5/5 globally. Sensation intact throughout. No focal deficits. Vasc: +2 bilateral distal pulses. MSK:  - Gait analysis reveals a non-antalgic gait due to her heel pain. -   Examination of the left Achilles reveals a focal area of thickening approximately 2-3 cm proximal to the distal insertion bilaterally. This has very mild tenderness. No overlying erythema or induration. The Achilles tendon is grossly intact. She has fairly good plantar flexion strength. She is able to do a single leg heel lift on the left. Full ankle range of motion without pain or swelling. - Knee: Normal to inspection other than a minimal amount of swelling over the pes. Minimal tenderness over the medial joint line.  Palpation normal with no warmth or joint line tenderness, patellar tenderness,  or condyle tenderness. ROM normal in flexion and extension and lower leg rotation. Ligaments with solid consistent endpoints including ACL, PCL, LCL, MCL. Negative Mcmurray's and provocative meniscal tests. Non painful patellar compression. Patellar and quadriceps tendons unremarkable. Hamstring and quadriceps strength is normal.  Limited musculoskeletal ultrasound:  Achilles: Long and short axis views were obtained of the bilateral Achilles tendon. The left Achilles tendon reveals a thickening at its greatest approximately 2-3 cm proximal to the distal insertion. The tendon is 0.73 cm at its greatest thickness, improved from 0.84 at last visit. It is grossly intact. The right Achilles tendon is 0.67-0.73 cm at it's largest diameter, roughly equal to the thickest portion at her last visit, 6 weeks ago.  There does not appear to be neovascularity. She does have several calcifications spread throughout the achilles. There is also some edema located superficially  No  discrete tears are seen.   Knee: Long and short axis views were obtained of the right knee (quad tendon & medial joint line).No effusion present in the suprapatellar pouch. The medial joint line has minimal joint space, but no spurring or meniscal tears.     MEDICATIONS, LABS & OTHER ORDERS: Previous Medications   ASPIRIN 81 MG TABLET    Take  81 mg by mouth daily.     ATORVASTATIN (LIPITOR) 10 MG TABLET       CLOBETASOL (TEMOVATE) 0.05 % GEL       CYCLOBENZAPRINE (FLEXERIL) 5 MG TABLET    1 tab po qhs   IBANDRONATE (BONIVA) 150 MG TABLET       KETOPROFEN POWD    APPLY TO AFFECTED AREA TWICE A DAY.   MAGNESIUM 100 MG CAPS    Take 100 capsules by mouth daily.     MULTIPLE VITAMIN (MULTI-VITAMIN PO)    Take 1 tablet by mouth daily.     MUPIROCIN OINTMENT (BACTROBAN) 2 %       RESTASIS 0.05 % OPHTHALMIC EMULSION       SIMVASTATIN (ZOCOR) 20 MG TABLET    Take 20 mg by mouth at bedtime.     VITAMIN D, CHOLECALCIFEROL, PO    Take 400 mg by mouth daily.     Modified Medications   No medications on file   New Prescriptions   No medications on file   Discontinued Medications   No medications on file  No orders of the defined types were placed in this encounter.   ASSESSMENT & PLAN: See problem based charting & AVS for pt instructions.

## 2015-06-25 NOTE — Assessment & Plan Note (Signed)
Mrs. Santiesteban has medial right knee pain with walking. No joint effusion with u/s.  Most likely DJD considering minimal joint space medially.   - Given BodyHelix neoprene brace  - Instructed on knee stabilizing exercises.  - continue to use topical ketoprofen PRN.  - f/u PRN.

## 2015-08-20 ENCOUNTER — Ambulatory Visit (HOSPITAL_COMMUNITY): Payer: Medicare Other

## 2015-08-28 ENCOUNTER — Ambulatory Visit (INDEPENDENT_AMBULATORY_CARE_PROVIDER_SITE_OTHER): Payer: Medicare Other | Admitting: Internal Medicine

## 2015-08-28 ENCOUNTER — Encounter: Payer: Self-pay | Admitting: Internal Medicine

## 2015-08-28 VITALS — BP 116/80 | HR 60 | Ht 65.0 in | Wt 123.0 lb

## 2015-08-28 DIAGNOSIS — R0789 Other chest pain: Secondary | ICD-10-CM | POA: Diagnosis not present

## 2015-08-28 DIAGNOSIS — Z136 Encounter for screening for cardiovascular disorders: Secondary | ICD-10-CM | POA: Insufficient documentation

## 2015-08-28 DIAGNOSIS — E785 Hyperlipidemia, unspecified: Secondary | ICD-10-CM | POA: Insufficient documentation

## 2015-08-28 DIAGNOSIS — R079 Chest pain, unspecified: Secondary | ICD-10-CM | POA: Insufficient documentation

## 2015-08-28 NOTE — Progress Notes (Signed)
OFFICE NOTE  Chief Complaint:   no complaints  Primary Care Physician: Jerlyn Ly, MD  HPI:  Autumn Wallace  Is a very pleasant 81 year old female who appears much younger than stated age. Past medical history significant for mild dyslipidemia on Lipitor and some orthopedic problems as well as osteoporosis. Otherwise she seems to be fairly healthy. She exercises 5 days a week doing both weights and cardio. She was referred for evaluation of an episode of mid substernal chest discomfort which occurred after doing some unusual exercises. There may been some palpitations associated with this but that quickly resolved and she's had no further complaints. She continues to be able to exercise without any restrictions. There is no known history of cardiovascular disease in the family. She's never had any cardiac events. She has no history of hypertension, diabetes or any significant risk factors for coronary disease. She is not a smoker. A screening at fourth sleepiness scale was negative for any sleep problems. EKG in the office today shows normal sinus rhythm at 60 without any ischemic changes.  PMHx:  Past Medical History  Diagnosis Date  . Allergy     seasonal  . Hyperlipidemia   . Acute pancreatitis   . Hiatal hernia   . Diverticulosis   . Adenomatous colon polyp 2004  . Pancreatitis   . Gallstones     Past Surgical History  Procedure Laterality Date  . Colonoscopy    . Tonsillectomy and adenoidectomy    . Dilation and curettage of uterus    . Cholecystectomy  2012    FAMHx:  Family History  Problem Relation Age of Onset  . Breast cancer Mother   . Colon cancer Neg Hx     SOCHx:   reports that she has never smoked. She has never used smokeless tobacco. She reports that she does not drink alcohol or use illicit drugs.  ALLERGIES:  Allergies  Allergen Reactions  . Latex Swelling    ROS: A comprehensive review of systems was negative.  HOME MEDS: Current  Outpatient Prescriptions  Medication Sig Dispense Refill  . aspirin 81 MG tablet Take 81 mg by mouth daily.      Marland Kitchen atorvastatin (LIPITOR) 10 MG tablet Take 10 mg by mouth daily.     . Biotin 10 MG TABS Take by mouth daily.    . clobetasol (TEMOVATE) 0.05 % GEL     . Cyanocobalamin (VITAMIN B 12 PO) Take by mouth daily.    . cyclobenzaprine (FLEXERIL) 5 MG tablet 1 tab po qhs 39 tablet 0  . ibandronate (BONIVA) 150 MG tablet     . Ketoprofen POWD APPLY TO AFFECTED AREA TWICE A DAY. 60 g PRN  . Magnesium 100 MG CAPS Take 100 capsules by mouth daily.      . Multiple Vitamin (MULTI-VITAMIN PO) Take 1 tablet by mouth daily.      . mupirocin ointment (BACTROBAN) 2 %     . RESTASIS 0.05 % ophthalmic emulsion     . simvastatin (ZOCOR) 20 MG tablet Take 20 mg by mouth at bedtime.      Marland Kitchen VITAMIN D, CHOLECALCIFEROL, PO Take 400 mg by mouth daily.       No current facility-administered medications for this visit.    LABS/IMAGING: No results found for this or any previous visit (from the past 48 hour(s)). No results found.  WEIGHTS: Wt Readings from Last 3 Encounters:  08/28/15 123 lb (55.792 kg)  06/25/15 125 lb (56.7  kg)  05/14/15 125 lb (56.7 kg)    VITALS: BP 116/80 mmHg  Pulse 60  Ht 5\' 5"  (1.651 m)  Wt 123 lb (55.792 kg)  BMI 20.47 kg/m2  EXAM: General appearance: alert and no distress Neck: no carotid bruit and no JVD Lungs: clear to auscultation bilaterally Heart: regular rate and rhythm, S1, S2 normal, no murmur, click, rub or gallop Abdomen: soft, non-tender; bowel sounds normal; no masses,  no organomegaly Extremities: extremities normal, atraumatic, no cyanosis or edema Pulses: 2+ and symmetric Skin: Skin color, texture, turgor normal. No rashes or lesions Neurologic: Grossly normal Psych: Pleasant  EKG: Normal sinus rhythm at 60  ASSESSMENT: 1. Musculoskeletal chest pain 2. Dyslipidemia - at goal  PLAN: 1.   Mrs. Jasko is at very low risk for cardiac  events. She has few cardiac risk factors other than age and dyslipidemia. She had a brief episode of mid sternal chest discomfort which was worse after exerting herself. Those symptoms have not returned. She gets some palpitations but very infrequently. She is able to exercise on a daily basis and has an appropriate weight without any symptoms. There is no significant history of coronary disease in her family. Her EKG at rest is normal. I do not feel that she needs any further cardiac testing at this time. She seems to be appropriately managed for her risk factors and I've encouraged her to c.ontinue to be as active as she is as that is likely the reason for her good cardiovascular health.  Thanks for the kind referral. I'm happy to see her back as needed.  Pixie Casino, MD, [facility] Attending Cardiologist [place] 08/28/2015, 2:57 PM

## 2015-08-28 NOTE — Patient Instructions (Signed)
Your physician recommends that you schedule a follow-up appointment in: AS NEEDED WITH DR. HILTY

## 2016-01-26 ENCOUNTER — Other Ambulatory Visit: Payer: Self-pay

## 2016-01-26 DIAGNOSIS — Z1231 Encounter for screening mammogram for malignant neoplasm of breast: Secondary | ICD-10-CM

## 2016-03-22 ENCOUNTER — Ambulatory Visit
Admission: RE | Admit: 2016-03-22 | Discharge: 2016-03-22 | Disposition: A | Discharge status: Home / Self Care | Payer: Medicare Other | Source: Ambulatory Visit

## 2016-03-22 DIAGNOSIS — Z1231 Encounter for screening mammogram for malignant neoplasm of breast: Secondary | ICD-10-CM

## 2016-08-24 ENCOUNTER — Encounter: Payer: Self-pay | Admitting: Internal Medicine

## 2016-08-24 ENCOUNTER — Ambulatory Visit (INDEPENDENT_AMBULATORY_CARE_PROVIDER_SITE_OTHER): Payer: Medicare Other | Admitting: Internal Medicine

## 2016-08-24 VITALS — BP 122/66 | HR 59 | Ht 65.0 in | Wt 125.0 lb

## 2016-08-24 DIAGNOSIS — R079 Chest pain, unspecified: Secondary | ICD-10-CM | POA: Diagnosis not present

## 2016-08-24 DIAGNOSIS — E785 Hyperlipidemia, unspecified: Secondary | ICD-10-CM

## 2016-08-24 DIAGNOSIS — R002 Palpitations: Secondary | ICD-10-CM | POA: Insufficient documentation

## 2016-08-24 NOTE — Progress Notes (Signed)
OFFICE NOTE  Chief Complaint:  Chest pain, left arm pain, palpitations  Primary Care Physician: Autumn Ly, MD  HPI:  Autumn Wallace  Is a very pleasant 82 year old female who appears much younger than stated age. Past medical history significant for mild dyslipidemia on Lipitor and some orthopedic problems as well as osteoporosis. Otherwise she seems to be fairly healthy. She exercises 5 days a week doing both weights and cardio. She was referred for evaluation of an episode of mid substernal chest discomfort which occurred after doing some unusual exercises. There may been some palpitations associated with this but that quickly resolved and she's had no further complaints. She continues to be able to exercise without any restrictions. There is no known history of cardiovascular disease in the family. She's never had any cardiac events. She has no history of hypertension, diabetes or any significant risk factors for coronary disease. She is not a smoker. A screening at fourth sleepiness scale was negative for any sleep problems. EKG in the office today shows normal sinus rhythm at 60 without any ischemic changes.  08/24/2016  Autumn Wallace returns today for follow-up. When I last saw her it was felt that her chest pain was noncardiac. She did well over the past year however this past Sunday she had an episode of chest discomfort which radiated to her left arm. It was dull and lasted for about 5-10 minutes and awakened her from sleep. There were some associated palpitations. This went away fairly quickly. She's had no symptoms with exercise. She does not regularly get palpitations and has not had left arm pain symptoms in the past either. EKG today shows sinus bradycardia 59 with no ischemic changes  PMHx:  Past Medical History:  Diagnosis Date  . Acute pancreatitis   . Adenomatous colon polyp 2004  . Allergy    seasonal  . Diverticulosis   . Gallstones   . Hiatal hernia   .  Hyperlipidemia   . Pancreatitis     Past Surgical History:  Procedure Laterality Date  . CHOLECYSTECTOMY  2012  . COLONOSCOPY    . DILATION AND CURETTAGE OF UTERUS    . TONSILLECTOMY AND ADENOIDECTOMY      FAMHx:  Family History  Problem Relation Age of Onset  . Breast cancer Mother   . Colon cancer Neg Hx     SOCHx:   reports that she has never smoked. She has never used smokeless tobacco. She reports that she does not drink alcohol or use drugs.  ALLERGIES:  Allergies  Allergen Reactions  . Latex Swelling    ROS: Pertinent items noted in HPI and remainder of comprehensive ROS otherwise negative.  HOME MEDS: Current Outpatient Prescriptions  Medication Sig Dispense Refill  . aspirin 81 MG tablet Take 81 mg by mouth daily.      Marland Kitchen atorvastatin (LIPITOR) 10 MG tablet Take 10 mg by mouth daily.     . Biotin 10 MG TABS Take by mouth daily.    . clobetasol (TEMOVATE) 0.05 % GEL     . Cyanocobalamin (VITAMIN B 12 PO) Take by mouth daily.    . cyclobenzaprine (FLEXERIL) 5 MG tablet 1 tab po qhs 39 tablet 0  . ibandronate (BONIVA) 150 MG tablet     . Ketoprofen POWD APPLY TO AFFECTED AREA TWICE A DAY. 60 g PRN  . Magnesium 100 MG CAPS Take 100 capsules by mouth daily.      . Multiple Vitamin (MULTI-VITAMIN PO) Take 1  tablet by mouth daily.      . mupirocin ointment (BACTROBAN) 2 %     . RESTASIS 0.05 % ophthalmic emulsion     . simvastatin (ZOCOR) 20 MG tablet Take 20 mg by mouth at bedtime.      Marland Kitchen VITAMIN D, CHOLECALCIFEROL, PO Take 400 mg by mouth daily.       No current facility-administered medications for this visit.     LABS/IMAGING: No results found for this or any previous visit (from the past 48 hour(s)). No results found.  WEIGHTS: Wt Readings from Last 3 Encounters:  08/24/16 125 lb (56.7 kg)  08/28/15 123 lb (55.8 kg)  06/25/15 125 lb (56.7 kg)    VITALS: BP 122/66   Pulse (!) 59   Ht 5\' 5"  (1.651 m)   Wt 125 lb (56.7 kg)   BMI 20.80 kg/m    EXAM: General appearance: alert and no distress Neck: no carotid bruit and no JVD Lungs: clear to auscultation bilaterally Heart: regular rate and rhythm, S1, S2 normal, no murmur, click, rub or gallop Abdomen: soft, non-tender; bowel sounds normal; no masses,  no organomegaly Extremities: extremities normal, atraumatic, no cyanosis or edema Pulses: 2+ and symmetric Skin: Skin color, texture, turgor normal. No rashes or lesions Neurologic: Grossly normal Psych: Pleasant  EKG: Sinus bradycardia 59  ASSESSMENT: 1. Chest pain concerning for unstable angina 2. Palpitations 3. Dyslipidemia - at goal  PLAN: 1.   [name] had new onset chest pain which awakened her from sleep and radiated to her left arm concerning for possible unstable angina. There were associated palpitations. I cannot rule out an arrhythmia such as atrial fibrillation. I recommend a 48 hour monitor to see if we can pick up on any further palpitations. With regards for chest pain, I recommended exercise Myoview to evaluate for any possible ischemia. Her cholesterol appears to be well-controlled. She was switched from simvastatin to low-dose atorvastatin due to some side effects including myalgias. She says although that has improved they've not gone away completely. She is on low-dose aspirin. Plan follow-up with me after studies are complete.  Autumn Casino, MD, [facility] Attending Cardiologist Autumn Wallace 08/24/2016, 2:04 PM

## 2016-08-24 NOTE — Patient Instructions (Addendum)
Medication Instructions:  Your physician recommends that you continue on your current medications as directed. Please refer to the Current Medication list given to you today.  Labwork: None Ordered  Testing/Procedures: Your physician has requested that you have en exercise stress myoview. For further information please visit [email or web address]. Please follow instruction sheet, as given.  Your physician has recommended that you wear a 48 HOUR holter monitor. Holter monitors are medical devices that record the heart's electrical activity. Doctors most often use these monitors to diagnose arrhythmias. Arrhythmias are problems with the speed or rhythm of the heartbeat. The monitor is a small, portable device. You can wear one while you do your normal daily activities. This is usually used to diagnose what is causing palpitations/syncope (passing out).    Follow-Up: Your physician recommends that you schedule a follow-up appointment in: AFTER ALL TESTING IS COMPLETE.  Any Other Special Instructions Will Be Listed Below (If Applicable).  NEXT APPT  DATE:______________________________________  TIME:______________________________________AM/PM    If you need a refill on your cardiac medications before your next appointment, please call your pharmacy.

## 2016-08-31 ENCOUNTER — Ambulatory Visit (INDEPENDENT_AMBULATORY_CARE_PROVIDER_SITE_OTHER): Payer: Medicare Other

## 2016-08-31 ENCOUNTER — Telehealth (HOSPITAL_COMMUNITY): Payer: Self-pay

## 2016-08-31 DIAGNOSIS — R002 Palpitations: Secondary | ICD-10-CM

## 2016-08-31 NOTE — Telephone Encounter (Signed)
Encounter complete. 

## 2016-09-02 ENCOUNTER — Ambulatory Visit (HOSPITAL_COMMUNITY)
Admission: RE | Admit: 2016-09-02 | Discharge: 2016-09-02 | Disposition: A | Discharge status: Home / Self Care | Payer: Medicare Other | Source: Ambulatory Visit | Attending: Cardiovascular Disease | Admitting: Cardiovascular Disease

## 2016-09-02 DIAGNOSIS — R079 Chest pain, unspecified: Secondary | ICD-10-CM | POA: Diagnosis not present

## 2016-09-02 DIAGNOSIS — R002 Palpitations: Secondary | ICD-10-CM | POA: Diagnosis not present

## 2016-09-02 LAB — MYOCARDIAL PERFUSION IMAGING
CHL CUP MPHR: 138 {beats}/min
CHL CUP NUCLEAR SDS: 1
CHL CUP NUCLEAR SRS: 0
CHL CUP NUCLEAR SSS: 1
CHL RATE OF PERCEIVED EXERTION: 18
CSEPED: 5 min
CSEPEW: 7 METS
CSEPPHR: 130 {beats}/min
Exercise duration (sec): 0 s
LV dias vol: 41 mL (ref 46–106)
LVSYSVOL: 11 mL
NUC STRESS TID: 1.13
Percent HR: 94 %
Rest HR: 58 {beats}/min

## 2016-09-02 MED ORDER — TECHNETIUM TC 99M TETROFOSMIN IV KIT
31.8000 | PACK | Freq: Once | INTRAVENOUS | Status: AC | PRN
  Administered 2016-09-02: 31.8 via INTRAVENOUS
  Filled 2016-09-02: qty 32

## 2016-09-02 MED ORDER — TECHNETIUM TC 99M TETROFOSMIN IV KIT
10.6000 | PACK | Freq: Once | INTRAVENOUS | Status: AC | PRN
  Administered 2016-09-02: 11 via INTRAVENOUS
  Filled 2016-09-02: qty 11

## 2016-09-09 ENCOUNTER — Encounter: Payer: Self-pay | Admitting: Internal Medicine

## 2016-09-09 ENCOUNTER — Ambulatory Visit (INDEPENDENT_AMBULATORY_CARE_PROVIDER_SITE_OTHER): Payer: Medicare Other | Admitting: Internal Medicine

## 2016-09-09 VITALS — BP 116/67 | HR 63 | Ht 65.0 in | Wt 125.2 lb

## 2016-09-09 DIAGNOSIS — I471 Supraventricular tachycardia: Secondary | ICD-10-CM | POA: Diagnosis not present

## 2016-09-09 DIAGNOSIS — R0789 Other chest pain: Secondary | ICD-10-CM | POA: Diagnosis not present

## 2016-09-09 DIAGNOSIS — R002 Palpitations: Secondary | ICD-10-CM | POA: Diagnosis not present

## 2016-09-09 MED ORDER — METOPROLOL TARTRATE 25 MG PO TABS: 12.5000 mg | ORAL_TABLET | Freq: Two times a day (BID) | ORAL | 3 refills | Status: DC

## 2016-09-09 NOTE — Progress Notes (Signed)
OFFICE NOTE  Chief Complaint:  Follow-up studies  Primary Care Physician: Jerlyn Ly, MD  HPI:  ANARIE CAL  Is a very pleasant 82 year old female who appears much younger than stated age. Past medical history significant for mild dyslipidemia on Lipitor and some orthopedic problems as well as osteoporosis. Otherwise she seems to be fairly healthy. She exercises 5 days a week doing both weights and cardio. She was referred for evaluation of an episode of mid substernal chest discomfort which occurred after doing some unusual exercises. There may been some palpitations associated with this but that quickly resolved and she's had no further complaints. She continues to be able to exercise without any restrictions. There is no known history of cardiovascular disease in the family. She's never had any cardiac events. She has no history of hypertension, diabetes or any significant risk factors for coronary disease. She is not a smoker. A screening at fourth sleepiness scale was negative for any sleep problems. EKG in the office today shows normal sinus rhythm at 60 without any ischemic changes.  08/24/2016  Mrs. Laurance returns today for follow-up. When I last saw her it was felt that her chest pain was noncardiac. She did well over the past year however this past Sunday she had an episode of chest discomfort which radiated to her left arm. It was dull and lasted for about 5-10 minutes and awakened her from sleep. There were some associated palpitations. This went away fairly quickly. She's had no symptoms with exercise. She does not regularly get palpitations and has not had left arm pain symptoms in the past either. EKG today shows sinus bradycardia 59 with no ischemic changes  09/09/2016  Mrs. Norell returns for follow-up today. Her stress test demonstrated normal LV function and no ischemia. Her Holter monitor did pick up on several episodes of PSVT which was limited to less than 20  beats. Subsequently after turning in her monitor she had a more sustained episode for which she was symptomatic. This was the same episode that she had originally had. Management may be complicated by bradycardia she's had some heart rates in the upper 50s and low 60s but she may tolerate low-dose beta blocker.  PMHx:  Past Medical History:  Diagnosis Date  . Acute pancreatitis   . Adenomatous colon polyp 2004  . Allergy    seasonal  . Diverticulosis   . Gallstones   . Hiatal hernia   . Hyperlipidemia   . Pancreatitis     Past Surgical History:  Procedure Laterality Date  . CHOLECYSTECTOMY  2012  . COLONOSCOPY    . DILATION AND CURETTAGE OF UTERUS    . TONSILLECTOMY AND ADENOIDECTOMY      FAMHx:  Family History  Problem Relation Age of Onset  . Breast cancer Mother   . Colon cancer Neg Hx     SOCHx:   reports that she has never smoked. She has never used smokeless tobacco. She reports that she does not drink alcohol or use drugs.  ALLERGIES:  Allergies  Allergen Reactions  . Latex Swelling    ROS: Pertinent items noted in HPI and remainder of comprehensive ROS otherwise negative.  HOME MEDS: Current Outpatient Prescriptions  Medication Sig Dispense Refill  . aspirin 81 MG tablet Take 81 mg by mouth daily.      Marland Kitchen atorvastatin (LIPITOR) 10 MG tablet Take 10 mg by mouth daily.     . Biotin 10 MG TABS Take by mouth daily.    Marland Kitchen  clobetasol (TEMOVATE) 0.05 % GEL     . Cyanocobalamin (VITAMIN B 12 PO) Take by mouth daily.    . cyclobenzaprine (FLEXERIL) 5 MG tablet 1 tab po qhs 39 tablet 0  . ibandronate (BONIVA) 150 MG tablet     . Ketoprofen POWD APPLY TO AFFECTED AREA TWICE A DAY. 60 g PRN  . Magnesium 100 MG CAPS Take 100 capsules by mouth daily.      . Multiple Vitamin (MULTI-VITAMIN PO) Take 1 tablet by mouth daily.      . mupirocin ointment (BACTROBAN) 2 %     . RESTASIS 0.05 % ophthalmic emulsion     . VITAMIN D, CHOLECALCIFEROL, PO Take 400 mg by mouth  daily.       No current facility-administered medications for this visit.     LABS/IMAGING: No results found for this or any previous visit (from the past 48 hour(s)). No results found.  WEIGHTS: Wt Readings from Last 3 Encounters:  09/09/16 125 lb 3.2 oz (56.8 kg)  09/02/16 125 lb (56.7 kg)  08/24/16 125 lb (56.7 kg)    VITALS: BP 116/67   Pulse 63   Ht 5\' 5"  (1.651 m)   Wt 125 lb 3.2 oz (56.8 kg)   BMI 20.83 kg/m   EXAM: Deferred  EKG: Deferred  ASSESSMENT: 1. PSVT 2. Chest pain - low risk myoview with normal LV function 3. Palpitations 4. Dyslipidemia - at goal  PLAN: 1.   Mrs. Henault At a low risk stress test and her chest pain is likely associated with PSVT which was noted on a monitor. She was symptomatic with this. We will try low-dose metoprolol 12.5 mg twice a day. Hopefully she'll tolerate this without hypotension and bradycardia. Plan to see her back in 4-6 weeks. If she remains symptomatic or is intolerant of the medicine, she may need referral to EP for evaluation of ablation.  Pixie Casino, MD, [facility] Attending Cardiologist [place] 09/09/2016, 3:04 PM

## 2016-09-09 NOTE — Patient Instructions (Addendum)
Medication Instructions:  START Metoprolol Tartrate 25mg  Take half tab my mouth twice a day  Labwork: None   Testing/Procedures: None   Follow-Up: Your physician recommends that you schedule a follow-up appointment in: 4-6 weeks with Dr Debara Pickett.  Any Other Special Instructions Will Be Listed Below (If Applicable).     If you need a refill on your cardiac medications before your next appointment, please call your pharmacy.

## 2016-10-11 ENCOUNTER — Telehealth: Payer: Self-pay | Admitting: Internal Medicine

## 2016-10-11 NOTE — Telephone Encounter (Signed)
Phone rings w/o answer or VM pickup. 

## 2016-10-11 NOTE — Telephone Encounter (Signed)
Wondering if she goes to the dentist tomorrow will she need medication if her heart beats to fast at the appointment

## 2016-10-12 NOTE — Telephone Encounter (Signed)
Attempted to reach patient, no answer when dialed.

## 2016-10-27 ENCOUNTER — Ambulatory Visit: Payer: Medicare Other | Admitting: Internal Medicine

## 2016-11-04 ENCOUNTER — Encounter: Payer: Self-pay | Admitting: Internal Medicine

## 2016-11-04 ENCOUNTER — Ambulatory Visit (INDEPENDENT_AMBULATORY_CARE_PROVIDER_SITE_OTHER): Payer: Medicare Other | Admitting: Internal Medicine

## 2016-11-04 VITALS — BP 115/72 | HR 52 | Ht 65.0 in | Wt 127.2 lb

## 2016-11-04 DIAGNOSIS — R0789 Other chest pain: Secondary | ICD-10-CM

## 2016-11-04 DIAGNOSIS — I471 Supraventricular tachycardia: Secondary | ICD-10-CM

## 2016-11-04 DIAGNOSIS — E785 Hyperlipidemia, unspecified: Secondary | ICD-10-CM

## 2016-11-04 MED ORDER — METOPROLOL TARTRATE 25 MG PO TABS: 12.5000 mg | ORAL_TABLET | Freq: Two times a day (BID) | ORAL | 3 refills | Status: DC

## 2016-11-04 NOTE — Progress Notes (Signed)
OFFICE NOTE  Chief Complaint:  Follow-up med changes  Primary Care Physician: Jerlyn Ly, MD  HPI:  Autumn Wallace  Is a very pleasant 82 year old female who appears much younger than stated age. Past medical history significant for mild dyslipidemia on Lipitor and some orthopedic problems as well as osteoporosis. Otherwise she seems to be fairly healthy. She exercises 5 days a week doing both weights and cardio. She was referred for evaluation of an episode of mid substernal chest discomfort which occurred after doing some unusual exercises. There may been some palpitations associated with this but that quickly resolved and she's had no further complaints. She continues to be able to exercise without any restrictions. There is no known history of cardiovascular disease in the family. She's never had any cardiac events. She has no history of hypertension, diabetes or any significant risk factors for coronary disease. She is not a smoker. A screening at fourth sleepiness scale was negative for any sleep problems. EKG in the office today shows normal sinus rhythm at 60 without any ischemic changes.  08/24/2016  Autumn Wallace returns today for follow-up. When I last saw her it was felt that her chest pain was noncardiac. She did well over the past year however this past Sunday she had an episode of chest discomfort which radiated to her left arm. It was dull and lasted for about 5-10 minutes and awakened her from sleep. There were some associated palpitations. This went away fairly quickly. She's had no symptoms with exercise. She does not regularly get palpitations and has not had left arm pain symptoms in the past either. EKG today shows sinus bradycardia 59 with no ischemic changes  09/09/2016  Autumn Wallace returns for follow-up today. Her stress test demonstrated normal LV function and no ischemia. Her Holter monitor did pick up on several episodes of PSVT which was limited to less than  20 beats. Subsequently after turning in her monitor she had a more sustained episode for which she was symptomatic. This was the same episode that she had originally had. Management may be complicated by bradycardia she's had some heart rates in the upper 50s and low 60s but she may tolerate low-dose beta blocker.  11/04/2016  Autumn Wallace was seen today in follow-up. She seems to be tolerating low-dose beta blocker. She's had no recurrent SVT. She is very pleased with the medication. She is a self-reported person who is not interested in taking medications however she feels like this medicine is helpful and will take it. She also reported to me that she is not taking her Boniva or calcium and rarely takes her cholesterol medicine. I told her that this is important information for her primary care provider to be aware of.  PMHx:  Past Medical History:  Diagnosis Date  . Acute pancreatitis   . Adenomatous colon polyp 2004  . Allergy    seasonal  . Diverticulosis   . Gallstones   . Hiatal hernia   . Hyperlipidemia   . Pancreatitis     Past Surgical History:  Procedure Laterality Date  . CHOLECYSTECTOMY  2012  . COLONOSCOPY    . DILATION AND CURETTAGE OF UTERUS    . TONSILLECTOMY AND ADENOIDECTOMY      FAMHx:  Family History  Problem Relation Age of Onset  . Breast cancer Mother   . Colon cancer Neg Hx     SOCHx:   reports that she has never smoked. She has never used smokeless tobacco.  She reports that she does not drink alcohol or use drugs.  ALLERGIES:  Allergies  Allergen Reactions  . Latex Swelling    ROS: Pertinent items noted in HPI and remainder of comprehensive ROS otherwise negative.  HOME MEDS: Current Outpatient Prescriptions  Medication Sig Dispense Refill  . aspirin 81 MG tablet Take 81 mg by mouth daily.      Marland Kitchen atorvastatin (LIPITOR) 10 MG tablet Take 10 mg by mouth daily.     . Biotin 10 MG TABS Take by mouth daily.    . clobetasol (TEMOVATE) 0.05 %  GEL     . Cyanocobalamin (VITAMIN B 12 PO) Take by mouth daily.    . cyclobenzaprine (FLEXERIL) 5 MG tablet 1 tab po qhs 39 tablet 0  . ibandronate (BONIVA) 150 MG tablet     . Ketoprofen POWD APPLY TO AFFECTED AREA TWICE A DAY. 60 g PRN  . Magnesium 100 MG CAPS Take 100 capsules by mouth daily.      . metoprolol tartrate (LOPRESSOR) 25 MG tablet Take 0.5 tablets (12.5 mg total) by mouth 2 (two) times daily. 90 tablet 3  . metroNIDAZOLE (METROCREAM) 0.75 % cream Apply 1 application topically as directed.    . Multiple Vitamin (MULTI-VITAMIN PO) Take 1 tablet by mouth daily.      . mupirocin ointment (BACTROBAN) 2 %     . RESTASIS 0.05 % ophthalmic emulsion     . VITAMIN D, CHOLECALCIFEROL, PO Take 400 mg by mouth daily.       No current facility-administered medications for this visit.     LABS/IMAGING: No results found for this or any previous visit (from the past 48 hour(s)). No results found.  WEIGHTS: Wt Readings from Last 3 Encounters:  11/04/16 127 lb 3.2 oz (57.7 kg)  09/09/16 125 lb 3.2 oz (56.8 kg)  09/02/16 125 lb (56.7 kg)    VITALS: BP 115/72   Pulse (!) 52   Ht 5\' 5"  (1.651 m)   Wt 127 lb 3.2 oz (57.7 kg)   SpO2 96%   BMI 21.17 kg/m   EXAM: Deferred  EKG: Deferred  ASSESSMENT: 1. PSVT 2. Chest pain - low risk myoview with normal LV function 3. Palpitations 4. Dyslipidemia - at goal  PLAN: 1.   Mrs. Bacon Has had improvement in her SVT on beta blocker. She should continue on this it seems to be well tolerated. As per his reported her Myoview was low risk and chest pain has improved. I've encouraged compliance with her cholesterol medicine as well as medications for osteopenia/osteoporosis which she has not been taking. Follow-up with me annually or sooner as necessary.  Pixie Casino, MD, [facility] Attending Cardiologist [place] 11/04/2016, 1:27 PM

## 2016-11-04 NOTE — Patient Instructions (Signed)
Your physician wants you to follow-up in: ONE YEAR WITH DR HILTY You will receive a reminder letter in the mail two months in advance. If you don't receive a letter, please call our office to schedule the follow-up appointment.  If you need a refill on your cardiac medications before your next appointment, please call your pharmacy.   

## 2016-11-17 ENCOUNTER — Encounter: Payer: Self-pay | Admitting: Internal Medicine

## 2016-11-17 ENCOUNTER — Ambulatory Visit (INDEPENDENT_AMBULATORY_CARE_PROVIDER_SITE_OTHER): Payer: Medicare Other | Admitting: Internal Medicine

## 2016-11-17 VITALS — BP 132/74 | HR 56 | Ht 65.0 in | Wt 125.8 lb

## 2016-11-17 DIAGNOSIS — S46812A Strain of other muscles, fascia and tendons at shoulder and upper arm level, left arm, initial encounter: Secondary | ICD-10-CM | POA: Insufficient documentation

## 2016-11-17 MED ORDER — IBUPROFEN 800 MG PO TABS: 800.0000 mg | ORAL_TABLET | Freq: Two times a day (BID) | ORAL | 0 refills | Status: DC

## 2016-11-17 NOTE — Progress Notes (Signed)
OFFICE NOTE  Chief Complaint:  Left neck, shoulder and arm pain  Primary Care Physician: Jerlyn Ly, MD  HPI:  Autumn Wallace  Is a very pleasant 82 year old female who appears much younger than stated age. Past medical history significant for mild dyslipidemia on Lipitor and some orthopedic problems as well as osteoporosis. Otherwise she seems to be fairly healthy. She exercises 5 days a week doing both weights and cardio. She was referred for evaluation of an episode of mid substernal chest discomfort which occurred after doing some unusual exercises. There may been some palpitations associated with this but that quickly resolved and she's had no further complaints. She continues to be able to exercise without any restrictions. There is no known history of cardiovascular disease in the family. She's never had any cardiac events. She has no history of hypertension, diabetes or any significant risk factors for coronary disease. She is not a smoker. A screening at fourth sleepiness scale was negative for any sleep problems. EKG in the office today shows normal sinus rhythm at 60 without any ischemic changes.  08/24/2016  Autumn Wallace returns today for follow-up. When I last saw her it was felt that her chest pain was noncardiac. She did well over the past year however this past Sunday she had an episode of chest discomfort which radiated to her left arm. It was dull and lasted for about 5-10 minutes and awakened her from sleep. There were some associated palpitations. This went away fairly quickly. She's had no symptoms with exercise. She does not regularly get palpitations and has not had left arm pain symptoms in the past either. EKG today shows sinus bradycardia 59 with no ischemic changes  09/09/2016  Autumn Wallace returns for follow-up today. Her stress test demonstrated normal LV function and no ischemia. Her Holter monitor did pick up on several episodes of PSVT which was limited to  less than 20 beats. Subsequently after turning in her monitor she had a more sustained episode for which she was symptomatic. This was the same episode that she had originally had. Management may be complicated by bradycardia she's had some heart rates in the upper 50s and low 60s but she may tolerate low-dose beta blocker.  11/04/2016  Autumn Wallace was seen today in follow-up. She seems to be tolerating low-dose beta blocker. She's had no recurrent SVT. She is very pleased with the medication. She is a self-reported person who is not interested in taking medications however she feels like this medicine is helpful and will take it. She also reported to me that she is not taking her Boniva or calcium and rarely takes her cholesterol medicine. I told her that this is important information for her primary care provider to be aware of.  11/17/2016  Autumn Wallace was recently seen in the office. She tells me that over the past couple days she's been doing some heavy lifting including carrying a full silver serving set which she was cleaning. She's also been doing some exercise at the gym. Yesterday she had pain in the left neck shoulder that radiated into her left arm. There was some numbness and tingling. It did not extend into her chest. Her husband however was concerned and recommended that she see her cardiologist. He does report some interval improvement in her pain today however kept the appointment with me.  PMHx:  Past Medical History:  Diagnosis Date  . Acute pancreatitis   . Adenomatous colon polyp 2004  . Allergy  seasonal  . Diverticulosis   . Gallstones   . Hiatal hernia   . Hyperlipidemia   . Pancreatitis     Past Surgical History:  Procedure Laterality Date  . CHOLECYSTECTOMY  2012  . COLONOSCOPY    . DILATION AND CURETTAGE OF UTERUS    . TONSILLECTOMY AND ADENOIDECTOMY      FAMHx:  Family History  Problem Relation Age of Onset  . Breast cancer Mother   . Colon cancer  Neg Hx     SOCHx:   reports that she has never smoked. She has never used smokeless tobacco. She reports that she does not drink alcohol or use drugs.  ALLERGIES:  Allergies  Allergen Reactions  . Latex Swelling    ROS: Pertinent items noted in HPI and remainder of comprehensive ROS otherwise negative.  HOME MEDS: Current Outpatient Prescriptions  Medication Sig Dispense Refill  . aspirin 81 MG tablet Take 81 mg by mouth daily.      Marland Kitchen atorvastatin (LIPITOR) 10 MG tablet Take 10 mg by mouth daily.     . Biotin 10 MG TABS Take by mouth daily.    . clobetasol (TEMOVATE) 0.05 % GEL     . Cyanocobalamin (VITAMIN B 12 PO) Take by mouth daily.    . cyclobenzaprine (FLEXERIL) 5 MG tablet 1 tab po qhs 39 tablet 0  . ibandronate (BONIVA) 150 MG tablet     . Ketoprofen POWD APPLY TO AFFECTED AREA TWICE A DAY. 60 g PRN  . Magnesium 100 MG CAPS Take 100 capsules by mouth daily.      . metoprolol tartrate (LOPRESSOR) 25 MG tablet Take 0.5 tablets (12.5 mg total) by mouth 2 (two) times daily. 90 tablet 3  . metroNIDAZOLE (METROCREAM) 0.75 % cream Apply 1 application topically as directed.    . Multiple Vitamin (MULTI-VITAMIN PO) Take 1 tablet by mouth daily.      . mupirocin ointment (BACTROBAN) 2 %     . RESTASIS 0.05 % ophthalmic emulsion     . VITAMIN D, CHOLECALCIFEROL, PO Take 400 mg by mouth daily.      Marland Kitchen ibuprofen (ADVIL,MOTRIN) 800 MG tablet Take 1 tablet (800 mg total) by mouth 2 (two) times daily. 14 tablet 0   No current facility-administered medications for this visit.     LABS/IMAGING: No results found for this or any previous visit (from the past 48 hour(s)). No results found.  WEIGHTS: Wt Readings from Last 3 Encounters:  11/17/16 125 lb 12.8 oz (57.1 kg)  11/04/16 127 lb 3.2 oz (57.7 kg)  09/09/16 125 lb 3.2 oz (56.8 kg)    VITALS: BP 132/74   Pulse (!) 56   Ht 5\' 5"  (1.651 m)   Wt 125 lb 12.8 oz (57.1 kg)   SpO2 100%   BMI 20.93 kg/m   EXAM: General  appearance: alert and no distress Lungs: clear to auscultation bilaterally Heart: regular rate and rhythm Extremities: extremities normal, atraumatic, no cyanosis or edema and there is no impingement of the left shoulder, POP is noted over the left trapezius, no pain with axial compression or rotation of the cervial spin Neurologic: Mental status: Alert, oriented, thought content appropriate, strength 5/5/ UE extension, ext. rotation, abduction and grip bilaterally  EKG: Deferred  ASSESSMENT: 1. Cervicalgia versus trapezius sprain/strain  PLAN: 1.   Autumn Wallace is describing left neck base, shoulder and left arm pain that caused some tingling in her left fingers. It's improving and she does have tenderness over  the left trapezius. This could be a strain sprain or possibly cervicalgia. She did not have pain with axial loading of the cervical spine. None of her symptoms are concerning for cardiac etiology. I recommended ibuprofen 800 mg twice daily for 1 week. She is to not lift anything greater than 10 pounds and stay off of her upper extremity exercises for this period of time. If her symptoms do not improve after completing the entire course of medication, she is directed to see her primary care provider for further evaluation.  Pixie Casino, MD, [facility] Attending Cardiologist [facility]  Pixie Casino 11/17/2016, 10:40 AM

## 2016-11-17 NOTE — Patient Instructions (Addendum)
START ibuprofen 800mg  twice daily for 7 days  Follow up in [place]

## 2017-01-25 ENCOUNTER — Other Ambulatory Visit: Payer: Self-pay | Admitting: Internal Medicine

## 2017-01-25 DIAGNOSIS — Z1231 Encounter for screening mammogram for malignant neoplasm of breast: Secondary | ICD-10-CM

## 2017-03-23 ENCOUNTER — Ambulatory Visit
Admission: RE | Admit: 2017-03-23 | Discharge: 2017-03-23 | Disposition: A | Discharge status: Home / Self Care | Payer: Medicare Other | Source: Ambulatory Visit | Attending: Internal Medicine | Admitting: Internal Medicine

## 2017-03-23 DIAGNOSIS — Z1231 Encounter for screening mammogram for malignant neoplasm of breast: Secondary | ICD-10-CM

## 2017-04-13 ENCOUNTER — Ambulatory Visit: Payer: Medicare Other | Admitting: Student

## 2017-04-19 ENCOUNTER — Ambulatory Visit: Payer: Medicare Other | Admitting: Sports Medicine

## 2017-04-24 ENCOUNTER — Other Ambulatory Visit: Payer: Self-pay | Admitting: Internal Medicine

## 2017-04-25 NOTE — Telephone Encounter (Signed)
Rx request sent to pharmacy.  

## 2017-05-12 ENCOUNTER — Telehealth: Payer: Self-pay | Admitting: Cardiology

## 2017-05-12 NOTE — Telephone Encounter (Signed)
New message   Pt states she could not get out of bed til it went away  Pt c/o of Chest Pain: STAT if CP now or developed within 24 hours  1. Are you having CP right now? no  2. Are you experiencing any other symptoms (ex. SOB, nausea, vomiting, sweating)?  no  3. How long have you been experiencing CP? This morning   4. Is your CP continuous or coming and going? Comes and goes   5. Have you taken Nitroglycerin? no ?

## 2017-05-12 NOTE — Telephone Encounter (Signed)
Spoke with pt, she woke this morning with a pain from her heart and a fluttering feeling in her chest. She laid still and the pain and fluttering went away in about 15 to 20 min. She felt fine after getting up and feels fine now. She does report soreness in her left arm. She denies SOB or other symptoms with the pain. She has not had any pain since last year. She will continue to monitor and call back if she develops pain again.

## 2017-06-03 ENCOUNTER — Encounter: Payer: Self-pay | Admitting: Internal Medicine

## 2017-06-03 ENCOUNTER — Ambulatory Visit (INDEPENDENT_AMBULATORY_CARE_PROVIDER_SITE_OTHER): Payer: Medicare Other | Admitting: Internal Medicine

## 2017-06-03 VITALS — BP 114/63 | HR 57 | Ht 65.0 in | Wt 127.4 lb

## 2017-06-03 DIAGNOSIS — R079 Chest pain, unspecified: Secondary | ICD-10-CM | POA: Diagnosis not present

## 2017-06-03 DIAGNOSIS — R002 Palpitations: Secondary | ICD-10-CM | POA: Diagnosis not present

## 2017-06-03 DIAGNOSIS — I471 Supraventricular tachycardia: Secondary | ICD-10-CM

## 2017-06-03 NOTE — Patient Instructions (Addendum)
Your physician has recommended that you wear an event monitor for 2 weeks placed @ [address]. Event monitors are medical devices that record the heart's electrical activity. Doctors most often [place] these monitors to diagnose arrhythmias. Arrhythmias are problems with the speed or rhythm of the heartbeat. The monitor is a small, portable device. You can wear one while you do your normal daily activities. This is usually used to diagnose what is causing palpitations/syncope (passing out).  Your physician has requested that you have a lexiscan myoview @ [facility]. For further information please visit [email or web address]. Please follow instruction sheet, as given.  Your physician recommends that you schedule a follow-up appointment after your testing with Dr. Debara Pickett.

## 2017-06-03 NOTE — Progress Notes (Signed)
OFFICE NOTE  Chief Complaint:  Chest pain, palpitations  Primary Care Physician: Crist Infante, MD  HPI:  ZONNIQUE NORKUS  Is a very pleasant 83 year old female who appears much younger than stated age. Past medical history significant for mild dyslipidemia on Lipitor and some orthopedic problems as well as osteoporosis. Otherwise she seems to be fairly healthy. She exercises 5 days a week doing both weights and cardio. She was referred for evaluation of an episode of mid substernal chest discomfort which occurred after doing some unusual exercises. There may been some palpitations associated with this but that quickly resolved and she's had no further complaints. She continues to be able to exercise without any restrictions. There is no known history of cardiovascular disease in the family. She's never had any cardiac events. She has no history of hypertension, diabetes or any significant risk factors for coronary disease. She is not a smoker. A screening at fourth sleepiness scale was negative for any sleep problems. EKG in the office today shows normal sinus rhythm at 60 without any ischemic changes.  08/24/2016  Mrs. Henson returns today for follow-up. When I last saw her it was felt that her chest pain was noncardiac. She did well over the past year however this past Sunday she had an episode of chest discomfort which radiated to her left arm. It was dull and lasted for about 5-10 minutes and awakened her from sleep. There were some associated palpitations. This went away fairly quickly. She's had no symptoms with exercise. She does not regularly get palpitations and has not had left arm pain symptoms in the past either. EKG today shows sinus bradycardia 59 with no ischemic changes  09/09/2016  Mrs. Busser returns for follow-up today. Her stress test demonstrated normal LV function and no ischemia. Her Holter monitor did pick up on several episodes of PSVT which was limited to less than  20 beats. Subsequently after turning in her monitor she had a more sustained episode for which she was symptomatic. This was the same episode that she had originally had. Management may be complicated by bradycardia she's had some heart rates in the upper 50s and low 60s but she may tolerate low-dose beta blocker.  11/04/2016  Mrs. Redondo was seen today in follow-up. She seems to be tolerating low-dose beta blocker. She's had no recurrent SVT. She is very pleased with the medication. She is a self-reported person who is not interested in taking medications however she feels like this medicine is helpful and will take it. She also reported to me that she is not taking her Boniva or calcium and rarely takes her cholesterol medicine. I told her that this is important information for her primary care provider to be aware of.  06/03/2017  Mrs. Sleeper was seen today in follow-up. She is now reporting chest pain. She says she feels some heaviness in her chest. Her husband noted that recently she's had some decreased energy and seems to fatigue easier. She also has some left arm pain when lifting things. She's decreased her exercise due to this. She reports some recurrent palpitations and tachycardia. In September of last year she had a stress test as well as a monitor. The stress test was negative for ischemia and showed an EF of 75%. Her monitor demonstrated recurrent SVT. This is despite being on beta blocker with heart rate in the 50s. Is not clear that there is room to necessarily increase the dose of her beta blocker. She describes  chest pain is sharp in the left anterior chest. It can come at rest or with exertion.  PMHx:  Past Medical History:  Diagnosis Date  . Acute pancreatitis   . Adenomatous colon polyp 2004  . Allergy    seasonal  . Diverticulosis   . Gallstones   . Hiatal hernia   . Hyperlipidemia   . Pancreatitis     Past Surgical History:  Procedure Laterality Date  .  CHOLECYSTECTOMY  2012  . COLONOSCOPY    . DILATION AND CURETTAGE OF UTERUS    . TONSILLECTOMY AND ADENOIDECTOMY      FAMHx:  Family History  Problem Relation Age of Onset  . Breast cancer Mother   . Colon cancer Neg Hx     SOCHx:   reports that she has never smoked. She has never used smokeless tobacco. She reports that she does not drink alcohol or use drugs.  ALLERGIES:  Allergies  Allergen Reactions  . Latex Swelling    ROS: Pertinent items noted in HPI and remainder of comprehensive ROS otherwise negative.  HOME MEDS: Current Outpatient Prescriptions  Medication Sig Dispense Refill  . aspirin 81 MG tablet Take 81 mg by mouth daily.      Marland Kitchen atorvastatin (LIPITOR) 10 MG tablet Take 10 mg by mouth daily.     . Biotin 10 MG TABS Take by mouth daily.    . clobetasol (TEMOVATE) 0.05 % GEL     . Cyanocobalamin (VITAMIN B 12 PO) Take by mouth daily.    . cyclobenzaprine (FLEXERIL) 5 MG tablet 1 tab po qhs 39 tablet 0  . ibandronate (BONIVA) 150 MG tablet     . ibuprofen (ADVIL,MOTRIN) 800 MG tablet Take 1 tablet (800 mg total) by mouth 2 (two) times daily. 14 tablet 0  . Ketoprofen POWD APPLY TO AFFECTED AREA TWICE A DAY. 60 g PRN  . Magnesium 100 MG CAPS Take 100 capsules by mouth daily.      . metroNIDAZOLE (METROCREAM) 0.75 % cream Apply 1 application topically as directed.    . Multiple Vitamin (MULTI-VITAMIN PO) Take 1 tablet by mouth daily.      . mupirocin ointment (BACTROBAN) 2 %     . RESTASIS 0.05 % ophthalmic emulsion     . VITAMIN D, CHOLECALCIFEROL, PO Take 400 mg by mouth daily.      . metoprolol tartrate (LOPRESSOR) 25 MG tablet Take 0.5 tablets (12.5 mg total) by mouth 2 (two) times daily. 90 tablet 3   No current facility-administered medications for this visit.     LABS/IMAGING: No results found for this or any previous visit (from the past 48 hour(s)). No results found.  WEIGHTS: Wt Readings from Last 3 Encounters:  06/03/17 127 lb 6.4 oz (57.8 kg)    11/17/16 125 lb 12.8 oz (57.1 kg)  11/04/16 127 lb 3.2 oz (57.7 kg)    VITALS: BP 114/63   Pulse (!) 57   Ht 5\' 5"  (1.651 m)   Wt 127 lb 6.4 oz (57.8 kg)   BMI 21.20 kg/m   EXAM: General appearance: alert and no distress Neck: no carotid bruit and no JVD Lungs: clear to auscultation bilaterally Heart: regular rate and rhythm Abdomen: soft, non-tender; bowel sounds normal; no masses,  no organomegaly Extremities: extremities normal, atraumatic, no cyanosis or edema Pulses: 2+ and symmetric Skin: Skin color, texture, turgor normal. No rashes or lesions Neurologic: Grossly normal Psych: Pleasant  EKG: Sinus bradycardia 57  ASSESSMENT: 1. Recurrent chest pain  and fatigue -  low risk myoview with normal LV function (08/2016) 2. History of PSVT 3. Palpitations 4. Dyslipidemia - at goal  PLAN: 1.   Mrs. Student reports recurrent chest pain and fatigue. Symptoms are somewhat different than the chest pain she had last year. She did undergo Myoview stress test which was negative for ischemia. Her husband says that she is not as active as she used to be. I suspect this is unlikely to be coronary disease however she does have risk factors for that. I recommend a repeat stress test. She's also likely having recurrent SVT, but could be having atrial fibrillation which may explain her new fatigue. I did advise a two-week monitor to see we can pickup on what arrhythmia she may be having an excess of her beta blocker. Follow-up with me afterwards.  Pixie Casino, MD, [facility] Attending Cardiologist [place] C Hilty 06/03/2017, 5:19 PM

## 2017-06-06 ENCOUNTER — Telehealth: Payer: Self-pay | Admitting: *Deleted

## 2017-06-06 NOTE — Telephone Encounter (Signed)
Left message for patient to call and schedule 2 week event omonitor, lexiscan myoview and f/u appointment with Dr. Debara Pickett.

## 2017-06-16 ENCOUNTER — Ambulatory Visit (INDEPENDENT_AMBULATORY_CARE_PROVIDER_SITE_OTHER): Payer: Medicare Other

## 2017-06-16 ENCOUNTER — Telehealth (HOSPITAL_COMMUNITY): Payer: Self-pay

## 2017-06-16 DIAGNOSIS — R002 Palpitations: Secondary | ICD-10-CM | POA: Diagnosis not present

## 2017-06-16 NOTE — Telephone Encounter (Signed)
Encounter complete. 

## 2017-06-21 ENCOUNTER — Ambulatory Visit (HOSPITAL_COMMUNITY)
Admission: RE | Admit: 2017-06-21 | Discharge: 2017-06-21 | Disposition: A | Discharge status: Home / Self Care | Payer: Medicare Other | Source: Ambulatory Visit | Attending: Cardiovascular Disease | Admitting: Cardiovascular Disease

## 2017-06-21 DIAGNOSIS — R079 Chest pain, unspecified: Secondary | ICD-10-CM

## 2017-06-22 ENCOUNTER — Telehealth (HOSPITAL_COMMUNITY): Payer: Self-pay

## 2017-06-22 NOTE — Telephone Encounter (Signed)
Encounter complete. 

## 2017-06-24 ENCOUNTER — Ambulatory Visit (HOSPITAL_COMMUNITY)
Admission: RE | Admit: 2017-06-24 | Discharge: 2017-06-24 | Disposition: A | Discharge status: Home / Self Care | Payer: Medicare Other | Source: Ambulatory Visit | Attending: Cardiovascular Disease | Admitting: Cardiovascular Disease

## 2017-06-24 DIAGNOSIS — Z8249 Family history of ischemic heart disease and other diseases of the circulatory system: Secondary | ICD-10-CM | POA: Insufficient documentation

## 2017-06-24 DIAGNOSIS — I10 Essential (primary) hypertension: Secondary | ICD-10-CM | POA: Diagnosis not present

## 2017-06-24 DIAGNOSIS — R5383 Other fatigue: Secondary | ICD-10-CM | POA: Diagnosis not present

## 2017-06-24 DIAGNOSIS — R002 Palpitations: Secondary | ICD-10-CM | POA: Insufficient documentation

## 2017-06-24 DIAGNOSIS — R079 Chest pain, unspecified: Secondary | ICD-10-CM | POA: Diagnosis not present

## 2017-06-24 DIAGNOSIS — I2510 Atherosclerotic heart disease of native coronary artery without angina pectoris: Secondary | ICD-10-CM | POA: Diagnosis present

## 2017-06-24 LAB — MYOCARDIAL PERFUSION IMAGING
CHL CUP NUCLEAR SRS: 0
LV dias vol: 59 mL (ref 46–106)
LV sys vol: 15 mL
Peak HR: 70 {beats}/min
Rest HR: 54 {beats}/min
SDS: 0
SSS: 0
TID: 1.19

## 2017-06-24 MED ORDER — REGADENOSON 0.4 MG/5ML IV SOLN
0.4000 mg | Freq: Once | INTRAVENOUS | Status: AC
  Administered 2017-06-24: 0.4 mg via INTRAVENOUS

## 2017-06-24 MED ORDER — TECHNETIUM TC 99M TETROFOSMIN IV KIT
31.5000 | PACK | Freq: Once | INTRAVENOUS | Status: AC | PRN
  Administered 2017-06-24: 31.5 via INTRAVENOUS
  Filled 2017-06-24: qty 32

## 2017-06-24 MED ORDER — TECHNETIUM TC 99M TETROFOSMIN IV KIT
9.6000 | PACK | Freq: Once | INTRAVENOUS | Status: AC | PRN
  Administered 2017-06-24: 9.6 via INTRAVENOUS
  Filled 2017-06-24: qty 10

## 2017-06-28 ENCOUNTER — Ambulatory Visit: Payer: Medicare Other | Admitting: Internal Medicine

## 2017-07-27 ENCOUNTER — Ambulatory Visit (INDEPENDENT_AMBULATORY_CARE_PROVIDER_SITE_OTHER): Payer: Medicare Other | Admitting: Internal Medicine

## 2017-07-27 ENCOUNTER — Encounter: Payer: Self-pay | Admitting: Internal Medicine

## 2017-07-27 VITALS — BP 106/66 | HR 64 | Ht 64.0 in | Wt 126.8 lb

## 2017-07-27 DIAGNOSIS — R002 Palpitations: Secondary | ICD-10-CM | POA: Diagnosis not present

## 2017-07-27 DIAGNOSIS — R079 Chest pain, unspecified: Secondary | ICD-10-CM

## 2017-07-27 DIAGNOSIS — E785 Hyperlipidemia, unspecified: Secondary | ICD-10-CM

## 2017-07-27 NOTE — Progress Notes (Signed)
OFFICE NOTE  Chief Complaint:  Follow-up stress test, monitor  Primary Care Physician: Crist Infante, MD  HPI:  Autumn Wallace  Is a very pleasant 83 year old female who appears much younger than stated age. Past medical history significant for mild dyslipidemia on Lipitor and some orthopedic problems as well as osteoporosis. Otherwise she seems to be fairly healthy. She exercises 5 days a week doing both weights and cardio. She was referred for evaluation of an episode of mid substernal chest discomfort which occurred after doing some unusual exercises. There may been some palpitations associated with this but that quickly resolved and she's had no further complaints. She continues to be able to exercise without any restrictions. There is no known history of cardiovascular disease in the family. She's never had any cardiac events. She has no history of hypertension, diabetes or any significant risk factors for coronary disease. She is not a smoker. A screening at fourth sleepiness scale was negative for any sleep problems. EKG in the office today shows normal sinus rhythm at 60 without any ischemic changes.  08/24/2016  Autumn Wallace returns today for follow-up. When I last saw her it was felt that her chest pain was noncardiac. She did well over the past year however this past Sunday she had an episode of chest discomfort which radiated to her left arm. It was dull and lasted for about 5-10 minutes and awakened her from sleep. There were some associated palpitations. This went away fairly quickly. She's had no symptoms with exercise. She does not regularly get palpitations and has not had left arm pain symptoms in the past either. EKG today shows sinus bradycardia 59 with no ischemic changes  09/09/2016  Autumn Wallace returns for follow-up today. Her stress test demonstrated normal LV function and no ischemia. Her Holter monitor did pick up on several episodes of PSVT which was limited to  less than 20 beats. Subsequently after turning in her monitor she had a more sustained episode for which she was symptomatic. This was the same episode that she had originally had. Management may be complicated by bradycardia she's had some heart rates in the upper 50s and low 60s but she may tolerate low-dose beta blocker.  11/04/2016  Autumn Wallace was seen today in follow-up. She seems to be tolerating low-dose beta blocker. She's had no recurrent SVT. She is very pleased with the medication. She is a self-reported person who is not interested in taking medications however she feels like this medicine is helpful and will take it. She also reported to me that she is not taking her Boniva or calcium and rarely takes her cholesterol medicine. I told her that this is important information for her primary care provider to be aware of.  06/03/2017  Autumn Wallace was seen today in follow-up. She is now reporting chest pain. She says she feels some heaviness in her chest. Her husband noted that recently she's had some decreased energy and seems to fatigue easier. She also has some left arm pain when lifting things. She's decreased her exercise due to this. She reports some recurrent palpitations and tachycardia. In September of last year she had a stress test as well as a monitor. The stress test was negative for ischemia and showed an EF of 75%. Her monitor demonstrated recurrent SVT. This is despite being on beta blocker with heart rate in the 50s. Is not clear that there is room to necessarily increase the dose of her beta blocker. She  describes chest pain is sharp in the left anterior chest. It can come at rest or with exertion.  07/27/2017  Autumn Wallace returns today for follow-up. She underwent a stress test which was negative for ischemia and showed normal LV function. Her monitor shows no evidence of SVT during this. Although she says she still gets occasional symptoms. In general she had mostly  bradycardia and tends to have a low blood pressure therefore I would not increase her beta blocker any further. I reassured her that these episodes are not life-threatening.  PMHx:  Past Medical History:  Diagnosis Date  . Acute pancreatitis   . Adenomatous colon polyp 2004  . Allergy    seasonal  . Diverticulosis   . Gallstones   . Hiatal hernia   . Hyperlipidemia   . Pancreatitis     Past Surgical History:  Procedure Laterality Date  . CHOLECYSTECTOMY  2012  . COLONOSCOPY    . DILATION AND CURETTAGE OF UTERUS    . TONSILLECTOMY AND ADENOIDECTOMY      FAMHx:  Family History  Problem Relation Age of Onset  . Breast cancer Mother   . Colon cancer Neg Hx     SOCHx:   reports that she has never smoked. She has never used smokeless tobacco. She reports that she does not drink alcohol or use drugs.  ALLERGIES:  Allergies  Allergen Reactions  . Latex Swelling    ROS: Pertinent items noted in HPI and remainder of comprehensive ROS otherwise negative.  HOME MEDS: Current Outpatient Prescriptions  Medication Sig Dispense Refill  . aspirin 81 MG tablet Take 81 mg by mouth daily.      Marland Kitchen atorvastatin (LIPITOR) 10 MG tablet Take 10 mg by mouth daily.     . Biotin 10 MG TABS Take by mouth daily.    . clobetasol (TEMOVATE) 0.05 % GEL     . Cyanocobalamin (VITAMIN B 12 PO) Take by mouth daily.    . cyclobenzaprine (FLEXERIL) 5 MG tablet 1 tab po qhs 39 tablet 0  . ibandronate (BONIVA) 150 MG tablet     . ibuprofen (ADVIL,MOTRIN) 800 MG tablet Take 1 tablet (800 mg total) by mouth 2 (two) times daily. 14 tablet 0  . Ketoprofen POWD APPLY TO AFFECTED AREA TWICE A DAY. 60 g PRN  . Magnesium 100 MG CAPS Take 100 capsules by mouth daily.      . metroNIDAZOLE (METROCREAM) 0.75 % cream Apply 1 application topically as directed.    . Multiple Vitamin (MULTI-VITAMIN PO) Take 1 tablet by mouth daily.      . mupirocin ointment (BACTROBAN) 2 %     . RESTASIS 0.05 % ophthalmic emulsion      . VITAMIN D, CHOLECALCIFEROL, PO Take 400 mg by mouth daily.      . metoprolol tartrate (LOPRESSOR) 25 MG tablet Take 0.5 tablets (12.5 mg total) by mouth 2 (two) times daily. 90 tablet 3   No current facility-administered medications for this visit.     LABS/IMAGING: No results found for this or any previous visit (from the past 48 hour(s)). No results found.  WEIGHTS: Wt Readings from Last 3 Encounters:  07/27/17 126 lb 12.8 oz (57.5 kg)  06/24/17 127 lb (57.6 kg)  06/03/17 127 lb 6.4 oz (57.8 kg)    VITALS: BP 106/66   Pulse 64   Ht 5\' 4"  (1.626 m)   Wt 126 lb 12.8 oz (57.5 kg)   BMI 21.77 kg/m   EXAM: General  appearance: alert and no distress Neck: no carotid bruit and no JVD Lungs: clear to auscultation bilaterally Heart: regular rate and rhythm Abdomen: soft, non-tender; bowel sounds normal; no masses,  no organomegaly Extremities: extremities normal, atraumatic, no cyanosis or edema Pulses: 2+ and symmetric Skin: Skin color, texture, turgor normal. No rashes or lesions Neurologic: Grossly normal Psych: Pleasant  EKG: Deferred  ASSESSMENT: 1. Recurrent chest pain and fatigue -  low risk myoview with normal LV function (06/2017) 2. History of PSVT - no recent findings on the monitor 3. Palpitations 4. Dyslipidemia - at goal  PLAN: 1.   Autumn Wallace returns today for follow-up. She had a repeat Myoview which was low risk and showed normal LV function. A repeat monitor also failed to show any SVT or palpitations. She is on a beta blocker with general bradycardia and low normal blood pressure. I don't think there is any room for increasing the dose. I continue her current medications and reassured her that this is not of any concern. Follow-up with me annually or sooner as necessary.  Pixie Casino, MD, [facility] Attending Cardiologist [place] 07/27/2017, 4:13 PM

## 2017-07-27 NOTE — Patient Instructions (Signed)
Your physician wants you to follow-up in: ONE YEAR with Dr. Hilty. You will receive a reminder letter in the mail two months in advance. If you don't receive a letter, please call our office to schedule the follow-up appointment.  

## 2017-12-30 ENCOUNTER — Other Ambulatory Visit: Payer: Self-pay | Admitting: Internal Medicine

## 2017-12-30 DIAGNOSIS — Z1231 Encounter for screening mammogram for malignant neoplasm of breast: Secondary | ICD-10-CM

## 2018-01-01 ENCOUNTER — Other Ambulatory Visit: Payer: Self-pay | Admitting: Internal Medicine

## 2018-01-01 DIAGNOSIS — N644 Mastodynia: Secondary | ICD-10-CM

## 2018-01-09 ENCOUNTER — Other Ambulatory Visit: Payer: Self-pay | Admitting: Internal Medicine

## 2018-01-09 DIAGNOSIS — Z139 Encounter for screening, unspecified: Secondary | ICD-10-CM

## 2018-03-24 ENCOUNTER — Ambulatory Visit: Payer: Medicare Other

## 2018-04-13 ENCOUNTER — Ambulatory Visit
Admission: RE | Admit: 2018-04-13 | Discharge: 2018-04-13 | Disposition: A | Discharge status: Home / Self Care | Payer: Medicare Other | Source: Ambulatory Visit | Attending: Internal Medicine | Admitting: Internal Medicine

## 2018-04-13 DIAGNOSIS — Z139 Encounter for screening, unspecified: Secondary | ICD-10-CM

## 2018-04-30 ENCOUNTER — Other Ambulatory Visit: Payer: Self-pay | Admitting: Internal Medicine

## 2018-05-02 NOTE — Telephone Encounter (Signed)
Rx sent to pharmacy   

## 2018-07-17 ENCOUNTER — Other Ambulatory Visit: Payer: Self-pay | Admitting: Internal Medicine

## 2018-07-17 DIAGNOSIS — R413 Other amnesia: Secondary | ICD-10-CM

## 2018-07-24 ENCOUNTER — Ambulatory Visit
Admission: RE | Admit: 2018-07-24 | Discharge: 2018-07-24 | Disposition: A | Discharge status: Home / Self Care | Payer: Medicare Other | Source: Ambulatory Visit | Attending: Internal Medicine | Admitting: Internal Medicine

## 2018-07-24 DIAGNOSIS — R413 Other amnesia: Secondary | ICD-10-CM

## 2018-07-28 ENCOUNTER — Ambulatory Visit: Payer: Medicare Other | Admitting: Internal Medicine

## 2018-07-28 ENCOUNTER — Encounter: Payer: Self-pay | Admitting: Internal Medicine

## 2018-07-28 VITALS — BP 120/74 | HR 52 | Ht 64.0 in | Wt 134.0 lb

## 2018-07-28 DIAGNOSIS — I471 Supraventricular tachycardia: Secondary | ICD-10-CM | POA: Diagnosis not present

## 2018-07-28 DIAGNOSIS — R002 Palpitations: Secondary | ICD-10-CM

## 2018-07-28 DIAGNOSIS — E785 Hyperlipidemia, unspecified: Secondary | ICD-10-CM

## 2018-07-28 NOTE — Progress Notes (Signed)
OFFICE NOTE  Chief Complaint:  No complaints  Primary Care Physician: Crist Infante, MD  HPI:  Autumn Wallace  Is a very pleasant 84 year old female who appears much younger than stated age. Past medical history significant for mild dyslipidemia on Lipitor and some orthopedic problems as well as osteoporosis. Otherwise she seems to be fairly healthy. She exercises 5 days a week doing both weights and cardio. She was referred for evaluation of an episode of mid substernal chest discomfort which occurred after doing some unusual exercises. There may been some palpitations associated with this but that quickly resolved and she's had no further complaints. She continues to be able to exercise without any restrictions. There is no known history of cardiovascular disease in the family. She's never had any cardiac events. She has no history of hypertension, diabetes or any significant risk factors for coronary disease. She is not a smoker. A screening at fourth sleepiness scale was negative for any sleep problems. EKG in the office today shows normal sinus rhythm at 60 without any ischemic changes.  08/24/2016  Autumn Wallace returns today for follow-up. When I last saw her it was felt that her chest pain was noncardiac. She did well over the past year however this past Sunday she had an episode of chest discomfort which radiated to her left arm. It was dull and lasted for about 5-10 minutes and awakened her from sleep. There were some associated palpitations. This went away fairly quickly. She's had no symptoms with exercise. She does not regularly get palpitations and has not had left arm pain symptoms in the past either. EKG today shows sinus bradycardia 59 with no ischemic changes  09/09/2016  Autumn Wallace returns for follow-up today. Her stress test demonstrated normal LV function and no ischemia. Her Holter monitor did pick up on several episodes of PSVT which was limited to less than 20 beats.  Subsequently after turning in her monitor she had a more sustained episode for which she was symptomatic. This was the same episode that she had originally had. Management may be complicated by bradycardia she's had some heart rates in the upper 50s and low 60s but she may tolerate low-dose beta blocker.  11/04/2016  Autumn Wallace was seen today in follow-up. She seems to be tolerating low-dose beta blocker. She's had no recurrent SVT. She is very pleased with the medication. She is a self-reported person who is not interested in taking medications however she feels like this medicine is helpful and will take it. She also reported to me that she is not taking her Boniva or calcium and rarely takes her cholesterol medicine. I told her that this is important information for her primary care provider to be aware of.  06/03/2017  Autumn Wallace was seen today in follow-up. She is now reporting chest pain. She says she feels some heaviness in her chest. Her husband noted that recently she's had some decreased energy and seems to fatigue easier. She also has some left arm pain when lifting things. She's decreased her exercise due to this. She reports some recurrent palpitations and tachycardia. In September of last year she had a stress test as well as a monitor. The stress test was negative for ischemia and showed an EF of 75%. Her monitor demonstrated recurrent SVT. This is despite being on beta blocker with heart rate in the 50s. Is not clear that there is room to necessarily increase the dose of her beta blocker. She describes chest  pain is sharp in the left anterior chest. It can come at rest or with exertion.  07/27/2017  Autumn Wallace returns today for follow-up. She underwent a stress test which was negative for ischemia and showed normal LV function. Her monitor shows no evidence of SVT during this. Although she says she still gets occasional symptoms. In general she had mostly bradycardia and tends to  have a low blood pressure therefore I would not increase her beta blocker any further. I reassured her that these episodes are not life-threatening.  07/28/2018  Autumn Wallace was seen today in follow-up.  Overall doing well.  She denies any significant recurrent SVT although occasionally gets palpitations.  EKG shows sinus bradycardia today at 52 but otherwise no ischemic changes.  She denies any chest pain or shortness of breath.  She does do regular walking.  I reviewed labs from her primary care provider in September showed total cholesterol 180, HDL 49, LDL 119 and triglycerides 60.  Hemoglobin A1c is 5.6 and she has no history of diabetes or hypertension.  Her goal LDL is less than 130 on low-dose atorvastatin.  PMHx:  Past Medical History:  Diagnosis Date  . Acute pancreatitis   . Adenomatous colon polyp 2004  . Allergy    seasonal  . Diverticulosis   . Gallstones   . Hiatal hernia   . Hyperlipidemia   . Pancreatitis     Past Surgical History:  Procedure Laterality Date  . CHOLECYSTECTOMY  2012  . COLONOSCOPY    . DILATION AND CURETTAGE OF UTERUS    . TONSILLECTOMY AND ADENOIDECTOMY      FAMHx:  Family History  Problem Relation Age of Onset  . Breast cancer Mother   . Colon cancer Neg Hx     SOCHx:   reports that she has never smoked. She has never used smokeless tobacco. She reports that she does not drink alcohol or use drugs.  ALLERGIES:  Allergies  Allergen Reactions  . Latex Swelling    ROS: Pertinent items noted in HPI and remainder of comprehensive ROS otherwise negative.  HOME MEDS: Current Outpatient Medications  Medication Sig Dispense Refill  . aspirin 81 MG tablet Take 81 mg by mouth daily.      Marland Kitchen atorvastatin (LIPITOR) 10 MG tablet Take 10 mg by mouth daily.     . Biotin 10 MG TABS Take by mouth daily.    . clobetasol (TEMOVATE) 0.05 % GEL     . Cyanocobalamin (VITAMIN B 12 PO) Take by mouth daily.    . cyclobenzaprine (FLEXERIL) 5 MG tablet 1  tab po qhs 39 tablet 0  . ibandronate (BONIVA) 150 MG tablet     . ibuprofen (ADVIL,MOTRIN) 800 MG tablet Take 1 tablet (800 mg total) by mouth 2 (two) times daily. 14 tablet 0  . Ketoprofen POWD APPLY TO AFFECTED AREA TWICE A DAY. 60 g PRN  . Magnesium 100 MG CAPS Take 100 capsules by mouth daily.      . metoprolol tartrate (LOPRESSOR) 25 MG tablet TAKE 1/2 TABLET BY MOUTH TWICE A DAY 60 tablet 3  . metroNIDAZOLE (METROCREAM) 0.75 % cream Apply 1 application topically as directed.    . Multiple Vitamin (MULTI-VITAMIN PO) Take 1 tablet by mouth daily.      . mupirocin ointment (BACTROBAN) 2 %     . RESTASIS 0.05 % ophthalmic emulsion     . VITAMIN D, CHOLECALCIFEROL, PO Take 400 mg by mouth daily.      Marland Kitchen  metoprolol tartrate (LOPRESSOR) 25 MG tablet Take 0.5 tablets (12.5 mg total) by mouth 2 (two) times daily. 90 tablet 3   No current facility-administered medications for this visit.     LABS/IMAGING: No results found for this or any previous visit (from the past 48 hour(s)). No results found.  WEIGHTS: Wt Readings from Last 3 Encounters:  07/28/18 134 lb (60.8 kg)  07/27/17 126 lb 12.8 oz (57.5 kg)  06/24/17 127 lb (57.6 kg)    VITALS: BP 120/74 (BP Location: Left Arm, Patient Position: Sitting, Cuff Size: Normal)   Pulse (!) 52   Ht 5\' 4"  (1.626 m)   Wt 134 lb (60.8 kg)   BMI 23.00 kg/m   EXAM: General appearance: alert and no distress Neck: no carotid bruit and no JVD Lungs: clear to auscultation bilaterally Heart: regular rate and rhythm Abdomen: soft, non-tender; bowel sounds normal; no masses,  no organomegaly Extremities: extremities normal, atraumatic, no cyanosis or edema Pulses: 2+ and symmetric Skin: Skin color, texture, turgor normal. No rashes or lesions Neurologic: Grossly normal Psych: Pleasant  EKG: Sinus bradycardia 52-personally reviewed  ASSESSMENT: 1. Recurrent chest pain and fatigue -  low risk myoview with normal LV function  (06/2017) 2. History of PSVT - no recent findings on the monitor 3. Palpitations 4. Dyslipidemia - at goal  PLAN: 1.   Autumn Wallace denies any recurrent SVT.  She occasionally gets palpitations but they are very infrequent and controlled on metoprolol.  She had a low risk Myoview in 2018 for some recurrent chest pain and fatigue symptoms which have improved with increased exercise.  Her cholesterol is at goal.  No further changes to her medications today.  Follow-up with me annually or sooner as necessary.  Pixie Casino, MD, [facility], [place] Director of the [facility] Lipid [facility] &  Cardiovascular Risk Reduction Clinic Diplomate of the American Board of Clinical Lipidology Attending Cardiologist  Direct Dial: [phone]  Fax: [phone]  Website:  [email or web address] 07/28/2018, 9:30 AM

## 2018-07-28 NOTE — Patient Instructions (Signed)
Your physician wants you to follow-up in: ONE YEAR with Dr. Hilty. You will receive a reminder letter in the mail two months in advance. If you don't receive a letter, please call our office to schedule the follow-up appointment.  

## 2018-11-08 ENCOUNTER — Encounter (HOSPITAL_COMMUNITY): Payer: Medicare Other

## 2018-12-17 ENCOUNTER — Other Ambulatory Visit: Payer: Self-pay | Admitting: Internal Medicine

## 2019-02-16 ENCOUNTER — Encounter (HOSPITAL_COMMUNITY): Payer: Medicare Other

## 2019-08-07 ENCOUNTER — Ambulatory Visit: Payer: Medicare Other | Admitting: Internal Medicine

## 2019-08-09 ENCOUNTER — Other Ambulatory Visit: Payer: Self-pay | Admitting: Internal Medicine

## 2019-08-17 ENCOUNTER — Encounter: Payer: Self-pay | Admitting: Physician Assistant

## 2019-08-17 ENCOUNTER — Ambulatory Visit (INDEPENDENT_AMBULATORY_CARE_PROVIDER_SITE_OTHER): Payer: Medicare Other | Admitting: Physician Assistant

## 2019-08-17 ENCOUNTER — Other Ambulatory Visit: Payer: Self-pay

## 2019-08-17 VITALS — BP 120/60 | HR 60 | Ht 64.0 in | Wt 135.8 lb

## 2019-08-17 DIAGNOSIS — E785 Hyperlipidemia, unspecified: Secondary | ICD-10-CM | POA: Diagnosis not present

## 2019-08-17 DIAGNOSIS — I471 Supraventricular tachycardia: Secondary | ICD-10-CM

## 2019-08-17 MED ORDER — METOPROLOL TARTRATE 25 MG PO TABS
12.5000 mg | ORAL_TABLET | Freq: Two times a day (BID) | ORAL | 3 refills | Status: DC
Start: 2019-08-17 — End: 2020-10-28

## 2019-08-17 NOTE — Progress Notes (Signed)
Cardiology Office Note    Date:  08/18/2019   ID:  Autumn Wallace, DOB 1932-04-29, MRN [id number]  PCP:  Crist Infante, MD  Cardiologist:  Dr. Debara Pickett  Chief Complaint  Patient presents with  . Follow-up    seen for Dr. Debara Pickett    History of Present Illness:  Autumn Wallace is a 85 y.o. female with PMH of hyperlipidemia, history of pancreatitis, and history of diverticulosis.  She she had a negative stress test in 2017.  Her monitor demonstrated episodes of PSVT which were limited to less than 20 beats.  Stress test was repeated in 2018 which showed EF of 75%, no ischemia.  Heart monitor again demonstrated asymptomatic sinus bradycardia without significant SVT.  Her SVT is been managed using beta-blocker.  Over the past several years, she has been using low-dose beta-blocker Lipitor at 10 mg daily.  Patient presents today for cardiology office visit.  She remains active.  She is no longer taking her aspirin and Lipitor.  She says her last years lipid panel was off of Lipitor.  Looking back, last year's lipid panel showed a total cholesterol 208, HDL 61, LDL 127, triglyceride 100.  Her LDL goal is less than 130.  I recommended she continue exercise and diet.  I am fine with her off of aspirin.  Based on recommendation from 2019 American Heart Association guideline, this patient who has no prior history of coronary artery disease and in no anginal symptom, aspirin is no longer recommended as a preventative therapy.  Otherwise she has been doing well on the metoprolol without any significant palpitation.  She does have some degree of fatigue, she has upcoming annual visit with her primary care provider.  I will defer additional lab work to her PCP.  I recommend she send [place] a copy of her lab work.   Past Medical History:  Diagnosis Date  . Acute pancreatitis   . Adenomatous colon polyp 2004  . Allergy    seasonal  . Diverticulosis   . Gallstones   . Hiatal hernia   . Hyperlipidemia   .  Pancreatitis     Past Surgical History:  Procedure Laterality Date  . CHOLECYSTECTOMY  2012  . COLONOSCOPY    . DILATION AND CURETTAGE OF UTERUS    . TONSILLECTOMY AND ADENOIDECTOMY      Current Medications: Outpatient Medications Prior to Visit  Medication Sig Dispense Refill  . atorvastatin (LIPITOR) 10 MG tablet Take 10 mg by mouth daily.     . Cyanocobalamin (VITAMIN B 12 PO) Take by mouth daily.    Marland Kitchen escitalopram (LEXAPRO) 10 MG tablet Take 10 mg by mouth daily.    Marland Kitchen galantamine (RAZADYNE ER) 16 MG 24 hr capsule Take 16 mg by mouth daily.    . memantine (NAMENDA) 10 MG tablet Take 10 mg by mouth 2 (two) times daily.    . metroNIDAZOLE (METROCREAM) 0.75 % cream Apply 1 application topically as directed.    . Multiple Vitamin (MULTI-VITAMIN PO) Take 1 tablet by mouth daily.      . mupirocin ointment (BACTROBAN) 2 %     . VITAMIN D, CHOLECALCIFEROL, PO Take 400 mg by mouth daily.      . Biotin 10 MG TABS Take by mouth daily.    Marland Kitchen aspirin 81 MG tablet Take 81 mg by mouth daily.      . clobetasol (TEMOVATE) 0.05 % GEL     . cyclobenzaprine (FLEXERIL) 5 MG tablet 1 tab  po qhs (Patient not taking: Reported on 08/17/2019) 39 tablet 0  . ibandronate (BONIVA) 150 MG tablet     . ibuprofen (ADVIL,MOTRIN) 800 MG tablet Take 1 tablet (800 mg total) by mouth 2 (two) times daily. (Patient not taking: Reported on 08/17/2019) 14 tablet 0  . Ketoprofen POWD APPLY TO AFFECTED AREA TWICE A DAY. (Patient not taking: Reported on 08/17/2019) 60 g PRN  . Magnesium 100 MG CAPS Take 100 capsules by mouth daily.      . metoprolol tartrate (LOPRESSOR) 25 MG tablet Take 0.5 tablets (12.5 mg total) by mouth 2 (two) times daily. 90 tablet 3  . metoprolol tartrate (LOPRESSOR) 25 MG tablet Take 0.5 tablets (12.5 mg total) by mouth 2 (two) times daily. *NEEDS OFFICE VISIT* (Patient not taking: Reported on 08/17/2019) 30 tablet 0  . RESTASIS 0.05 % ophthalmic emulsion      No facility-administered medications prior  to visit.      Allergies:   Latex   Social History   Socioeconomic History  . Marital status: Married    Spouse name: Not on file  . Number of children: Not on file  . Years of education: Not on file  . Highest education level: Not on file  Occupational History  . Not on file  Social Needs  . Financial resource strain: Not on file  . Food insecurity    Worry: Not on file    Inability: Not on file  . Transportation needs    Medical: Not on file    Non-medical: Not on file  Tobacco Use  . Smoking status: Never Smoker  . Smokeless tobacco: Never Used  Substance and Sexual Activity  . Alcohol use: No  . Drug use: No  . Sexual activity: Not on file  Lifestyle  . Physical activity    Days per week: Not on file    Minutes per session: Not on file  . Stress: Not on file  Relationships  . Social Herbalist on phone: Not on file    Gets together: Not on file    Attends religious service: Not on file    Active member of club or organization: Not on file    Attends meetings of clubs or organizations: Not on file    Relationship status: Not on file  Other Topics Concern  . Not on file  Social History Narrative  . Not on file     Family History:  The patient's family history includes Breast cancer in her mother.   ROS:   Please see the history of present illness.    ROS All other systems reviewed and are negative.   PHYSICAL EXAM:   VS:  BP 120/60   Pulse 60   Ht 5\' 4"  (1.626 m)   Wt 135 lb 12.8 oz (61.6 kg)   SpO2 96%   BMI 23.31 kg/m    GEN: Well nourished, well developed, in no acute distress  HEENT: normal  Neck: no JVD, carotid bruits, or masses Cardiac: RRR; no murmurs, rubs, or gallops,no edema  Respiratory:  clear to auscultation bilaterally, normal work of breathing GI: soft, nontender, nondistended, + BS MS: no deformity or atrophy  Skin: warm and dry, no rash Neuro:  Alert and Oriented x 3, Strength and sensation are intact Psych:  euthymic mood, full affect  Wt Readings from Last 3 Encounters:  08/17/19 135 lb 12.8 oz (61.6 kg)  07/28/18 134 lb (60.8 kg)  07/27/17 126 lb 12.8  oz (57.5 kg)      Studies/Labs Reviewed:   EKG:  EKG is ordered today.  The ekg ordered today demonstrates sinus bradycardia, heart rate 55, no significant ST-T wave changes.  Recent Labs: No results found for requested labs within last 8760 hours.   Lipid Panel    Component Value Date/Time   CHOL 161 08/03/2011 0738   TRIG 117.0 08/03/2011 0738   HDL 50.90 08/03/2011 0738   CHOLHDL 3 08/03/2011 0738   VLDL 23.4 08/03/2011 0738   LDLCALC 87 08/03/2011 0738    Additional studies/ records that were reviewed today include:   Event monitor 06/16/2017 Asymptomatic sinus bradycardia. No SVT or tachycardia noted during the monitoring period.   Myoview 06/24/2017 Study Highlights    The left ventricular ejection fraction is hyperdynamic (>65%).  Nuclear stress EF: 74%.  There was no ST segment deviation noted during stress.  The study is normal.  This is a low risk study.   Normal pharmacologic nuclear stress test with no evidence for prior infarct or ischemia.       ASSESSMENT:    1. SVT (supraventricular tachycardia) (HCC)      PLAN:  In order of problems listed above:  1. History of SVT: Well controlled on metoprolol.  No recent palpitation  2. Hyperlipidemia: He is no longer taking the Lipitor for the past year.  Last lipid panel obtained in late 2019 showed LDL 127.  LDL goal for this patient is less than 130.  I recommended continue diet and exercise.  Since he has no prior diagnosed history of CAD: Aspirin is no longer recommended as a preventative therapy.    Medication Adjustments/Labs and Tests Ordered: Current medicines are reviewed at length with the patient today.  Concerns regarding medicines are outlined above.  Medication changes, Labs and Tests ordered today are listed in the Patient  Instructions below. Patient Instructions  Medication Instructions:  Your physician recommends that you continue on your current medications as directed. Please refer to the Current Medication list given to you today.  If you need a refill on your cardiac medications before your next appointment, please call your pharmacy.   Lab work: NONE ordered at this time of appointment   If you have labs (blood work) drawn today and your tests are completely normal, you will receive your results only by: Marland Kitchen MyChart Message (if you have MyChart) OR . A paper copy in the mail If you have any lab test that is abnormal or we need to change your treatment, we will call you to review the results.  Testing/Procedures: NONE ordered at this time of appointment   Follow-Up: At [facility], you and your health needs are our priority.  As part of our continuing mission to provide you with exceptional heart care, we have created designated Provider Care Teams.  These Care Teams include your primary Cardiologist (physician) and Advanced Practice Providers (APPs -  Physician Assistants and Nurse Practitioners) who all work together to provide you with the care you need, when you need it. You will need a follow up appointment in 12 months-September 2021.  Please call our office in July 2021 to schedule this appointment.  You may see Pixie Casino, MD or one of the following Advanced Practice Providers on your designated Care Team: [place], [place] . Fabian Sharp, PA-C  Any Other Special Instructions Will Be Listed Below (If Applicable).       Hilbert Corrigan, [place]  08/18/2019 10:56 PM  [facility] [place], [place], [place]  [address] Phone: [phone]; Fax: [phone]

## 2019-08-17 NOTE — Patient Instructions (Signed)
Medication Instructions:  Your physician recommends that you continue on your current medications as directed. Please refer to the Current Medication list given to you today.  If you need a refill on your cardiac medications before your next appointment, please call your pharmacy.   Lab work: NONE ordered at this time of appointment   If you have labs (blood work) drawn today and your tests are completely normal, you will receive your results only by: Marland Kitchen MyChart Message (if you have MyChart) OR . A paper copy in the mail If you have any lab test that is abnormal or we need to change your treatment, we will call you to review the results.  Testing/Procedures: NONE ordered at this time of appointment   Follow-Up: At [facility], you and your health needs are our priority.  As part of our continuing mission to provide you with exceptional heart care, we have created designated Provider Care Teams.  These Care Teams include your primary Cardiologist (physician) and Advanced Practice Providers (APPs -  Physician Assistants and Nurse Practitioners) who all work together to provide you with the care you need, when you need it. You will need a follow up appointment in 12 months-September 2021.  Please call our office in July 2021 to schedule this appointment.  You may see Pixie Casino, MD or one of the following Advanced Practice Providers on your designated Care Team: [place], [place] . Fabian Sharp, PA-C  Any Other Special Instructions Will Be Listed Below (If Applicable).

## 2019-08-18 ENCOUNTER — Encounter: Payer: Self-pay | Admitting: Physician Assistant

## 2019-12-15 ENCOUNTER — Ambulatory Visit: Payer: Medicare Other | Attending: Internal Medicine

## 2019-12-15 DIAGNOSIS — Z23 Encounter for immunization: Secondary | ICD-10-CM | POA: Insufficient documentation

## 2019-12-15 NOTE — Progress Notes (Signed)
   Covid-19 Vaccination Clinic  Name:  Autumn Wallace    MRN: [id number] DOB: 06/01/1932  12/15/2019  [name] was observed post Covid-19 immunization for 30 minutes based on pre-vaccination screening without incidence. She was provided with Vaccine Information Sheet and instruction to access the V-Safe system.   [name] was instructed to call 911 with any severe reactions post vaccine: Marland Kitchen Difficulty breathing  . Swelling of your face and throat  . A fast heartbeat  . A bad rash all over your body  . Dizziness and weakness    Immunizations Administered    Name Date Dose VIS Date Route   Pfizer COVID-19 Vaccine 12/15/2019  2:24 PM 0.3 mL 11/16/2019 Intramuscular   Manufacturer: [place]   Lot: [id number]   [place]: [id number]

## 2019-12-16 ENCOUNTER — Ambulatory Visit: Payer: Medicare PPO

## 2020-01-01 ENCOUNTER — Other Ambulatory Visit (HOSPITAL_COMMUNITY): Payer: Self-pay | Admitting: Internal Medicine

## 2020-01-01 ENCOUNTER — Other Ambulatory Visit: Payer: Self-pay | Admitting: Internal Medicine

## 2020-01-01 ENCOUNTER — Other Ambulatory Visit: Payer: Self-pay

## 2020-01-01 ENCOUNTER — Ambulatory Visit (HOSPITAL_COMMUNITY)
Admission: RE | Admit: 2020-01-01 | Discharge: 2020-01-01 | Disposition: A | Discharge status: Home / Self Care | Payer: Medicare PPO | Source: Ambulatory Visit | Attending: Internal Medicine | Admitting: Internal Medicine

## 2020-01-01 DIAGNOSIS — R200 Anesthesia of skin: Secondary | ICD-10-CM

## 2020-01-01 DIAGNOSIS — R202 Paresthesia of skin: Secondary | ICD-10-CM | POA: Diagnosis not present

## 2020-01-01 DIAGNOSIS — N644 Mastodynia: Secondary | ICD-10-CM

## 2020-01-01 LAB — POCT I-STAT CREATININE: Creatinine, Ser: 0.9 mg/dL (ref 0.44–1.00)

## 2020-01-01 MED ORDER — GADOBUTROL 1 MMOL/ML IV SOLN
7.0000 mL | Freq: Once | INTRAVENOUS | Status: AC | PRN
  Administered 2020-01-01: 7 mL via INTRAVENOUS

## 2020-01-02 ENCOUNTER — Other Ambulatory Visit: Payer: Self-pay | Admitting: Internal Medicine

## 2020-01-02 DIAGNOSIS — N644 Mastodynia: Secondary | ICD-10-CM

## 2020-01-03 ENCOUNTER — Ambulatory Visit: Payer: Medicare PPO | Admitting: Neurology

## 2020-01-03 ENCOUNTER — Encounter: Payer: Self-pay | Admitting: Neurology

## 2020-01-03 ENCOUNTER — Other Ambulatory Visit: Payer: Self-pay

## 2020-01-03 VITALS — BP 117/70 | HR 64 | Temp 96.6°F | Ht 63.0 in | Wt 136.5 lb

## 2020-01-03 DIAGNOSIS — R413 Other amnesia: Secondary | ICD-10-CM

## 2020-01-03 DIAGNOSIS — R200 Anesthesia of skin: Secondary | ICD-10-CM

## 2020-01-03 DIAGNOSIS — G301 Alzheimer's disease with late onset: Secondary | ICD-10-CM

## 2020-01-03 DIAGNOSIS — F0280 Dementia in other diseases classified elsewhere without behavioral disturbance: Secondary | ICD-10-CM

## 2020-01-03 DIAGNOSIS — G459 Transient cerebral ischemic attack, unspecified: Secondary | ICD-10-CM | POA: Diagnosis not present

## 2020-01-03 NOTE — Progress Notes (Signed)
[facility]  PATIENT: Autumn Wallace DOB: February 22, 1932  REFERRING DOCTOR OR PCP: Crist Infante SOURCE: Patient, notes from primary care, imaging reports, MRI images personally reviewed.  _________________________________   HISTORICAL  CHIEF COMPLAINT:  Chief Complaint  Patient presents with  . New Patient (Initial Visit)    RM 38. Paper referral from Dr. Joylene Draft for Dementia.     HISTORY OF PRESENT ILLNESS:  I had the pleasure seeing your patient, Trianna Lupien, at [facility] for neurologic consultation regarding her dementia and recent episode of left-sided numbness and weakness  She is an 86 year old woman who has had memory difficulties for the past 2 years.  She lives with her second husband and her daughter checks in with her frequently.  She is still driving short distances but not to places she does not do well.  She denies getting lost.   She no longer handles finances.    She continues to read. She used to work as a Product manager in [employer]..  Although Ms. Humann feels that she has already had trouble with memory for short period of time, her daughter reports she became more forgetful 3 years ago.    She took over the [place] in February.    She has been on galantamine ER and memantine.   More recently she has had some depression and is on Lexapro.    Her daughter feels it has helped her mom's mood.      Tristen denies issues with gait or strength, in general.    However, last week, she had an episode with much more numbness in the left arm and was weak.   She needed help to get out of the chair.    She feels this episode lasted 5 to 10 minutes and then she was back to baseline.    She has noted some left arm pain since the vaccination but no weakness.   This is actually doing better the past couple days.   She also has pain below the breast over the ribs on the left.  She has had the 1st Covid-19 vaccination.     Recent  blood-work reportedly showed low Vit D and she is taking supplements.    Montreal Cognitive Assessment  01/03/2020  Visuospatial/ Executive (0/5) 1  Naming (0/3) 1  Attention: Read list of digits (0/2) 0  Attention: Read list of letters (0/1) 0  Attention: Serial 7 subtraction starting at 100 (0/3) 0  Language: Repeat phrase (0/2) 1  Language : Fluency (0/1) 0  Abstraction (0/2) 1  Delayed Recall (0/5) 0  Orientation (0/6) 3  Total 7  Adjusted Score (based on education) 8    I personally reviewed the MRI of the brain performed 01/01/2020.  It shows generalized atrophy, slightly more than expected for age.  The atrophy is more pronounced in the medial temporal lobes and the periinsular cortex.  This pattern can be seen with Alzheimer's disease.  There were no acute findings.  REVIEW OF SYSTEMS: Constitutional: No fevers, chills, sweats, or change in appetite Eyes: No visual changes, double vision, eye pain Ear, nose and throat: No hearing loss, ear pain, nasal congestion, sore throat Cardiovascular: No chest pain, palpitations Respiratory: No shortness of breath at rest or with exertion.   No wheezes GastrointestinaI: No nausea, vomiting, diarrhea, abdominal pain, fecal incontinence Genitourinary: No dysuria, urinary retention or frequency.  No nocturia. Musculoskeletal: No neck pain, back pain Integumentary: No rash, pruritus, skin lesions  Neurological: as above Psychiatric: No depression at this time.  No anxiety Endocrine: No palpitations, diaphoresis, change in appetite, change in weigh or increased thirst Hematologic/Lymphatic: No anemia, purpura, petechiae. Allergic/Immunologic: No itchy/runny eyes, nasal congestion, recent allergic reactions, rashes  ALLERGIES: Allergies  Allergen Reactions  . Latex Swelling    HOME MEDICATIONS:  Current Outpatient Medications:  .  atorvastatin (LIPITOR) 10 MG tablet, Take 10 mg by mouth daily. , Disp: , Rfl:  .  Biotin 10 MG  TABS, Take by mouth daily., Disp: , Rfl:  .  Cyanocobalamin (VITAMIN B 12 PO), Take by mouth daily., Disp: , Rfl:  .  escitalopram (LEXAPRO) 10 MG tablet, Take 10 mg by mouth daily., Disp: , Rfl:  .  galantamine (RAZADYNE ER) 16 MG 24 hr capsule, Take 16 mg by mouth daily., Disp: , Rfl:  .  memantine (NAMENDA) 10 MG tablet, Take 10 mg by mouth 2 (two) times daily., Disp: , Rfl:  .  metoprolol tartrate (LOPRESSOR) 25 MG tablet, Take 0.5 tablets (12.5 mg total) by mouth 2 (two) times daily., Disp: 90 tablet, Rfl: 3 .  metroNIDAZOLE (METROCREAM) 0.75 % cream, Apply 1 application topically as directed., Disp: , Rfl:  .  Multiple Vitamin (MULTI-VITAMIN PO), Take 1 tablet by mouth daily.  , Disp: , Rfl:  .  mupirocin ointment (BACTROBAN) 2 %, , Disp: , Rfl:  .  VITAMIN D, CHOLECALCIFEROL, PO, Take 400 mg by mouth daily.  , Disp: , Rfl:   PAST MEDICAL HISTORY: Past Medical History:  Diagnosis Date  . Acute pancreatitis   . Adenomatous colon polyp 2004  . Allergy    seasonal  . Diverticulosis   . Gallstones   . Hiatal hernia   . Hyperlipidemia   . Pancreatitis     PAST SURGICAL HISTORY: Past Surgical History:  Procedure Laterality Date  . CHOLECYSTECTOMY  2012  . COLONOSCOPY    . DILATION AND CURETTAGE OF UTERUS    . TONSILLECTOMY AND ADENOIDECTOMY      FAMILY HISTORY: Family History  Problem Relation Age of Onset  . Breast cancer Mother   . Colon cancer Neg Hx     SOCIAL HISTORY:  Social History   Socioeconomic History  . Marital status: Married    Spouse name: Not on file  . Number of children: Not on file  . Years of education: Not on file  . Highest education level: Not on file  Occupational History  . Not on file  Tobacco Use  . Smoking status: Never Smoker  . Smokeless tobacco: Never Used  Substance and Sexual Activity  . Alcohol use: No  . Drug use: No  . Sexual activity: Not on file  Other Topics Concern  . Not on file  Social History Narrative   Lives  with husband   Right handed    1 cup coffee per day   Social Determinants of Health   Financial Resource Strain:   . Difficulty of Paying Living Expenses: Not on file  Food Insecurity:   . Worried About Charity fundraiser in the Last Year: Not on file  . Ran Out of Food in the Last Year: Not on file  Transportation Needs:   . Lack of Transportation (Medical): Not on file  . Lack of Transportation (Non-Medical): Not on file  Physical Activity:   . Days of Exercise per Week: Not on file  . Minutes of Exercise per Session: Not on file  Stress:   . Feeling  of Stress : Not on file  Social Connections:   . Frequency of Communication with Friends and Family: Not on file  . Frequency of Social Gatherings with Friends and Family: Not on file  . Attends Religious Services: Not on file  . Active Member of Clubs or Organizations: Not on file  . Attends Archivist Meetings: Not on file  . Marital Status: Not on file  Intimate Partner Violence:   . Fear of Current or Ex-Partner: Not on file  . Emotionally Abused: Not on file  . Physically Abused: Not on file  . Sexually Abused: Not on file     PHYSICAL EXAM  Vitals:   01/03/20 1337  BP: 117/70  Pulse: 64  Temp: (!) 96.6 F (35.9 C)  SpO2: 95%  Weight: 136 lb 8 oz (61.9 kg)  Height: 5' 3"  (1.6 m)    Body mass index is 24.18 kg/m.   General: The patient is well-developed and well-nourished and in no acute distress  HEENT:  Head is [place]/AT.  Sclera are anicteric.    Neck: No carotid bruits are noted.  The neck is nontender.  Cardiovascular: The heart has a regular rate and rhythm with a normal S1 and S2. There were no murmurs, gallops or rubs.    Skin: Extremities are without rash or  edema.  Musculoskeletal:  Back is nontender  Neurologic Exam  Mental status: She scored 8/30 on the [facility] cognitive assessment.  She had difficulties with memory, executive function, visual-spatial, attention and language.Marland Kitchen    Speech is normal.  Cranial nerves: Extraocular movements are full. Pupils are equal, round, and reactive to light and accomodation.  Visual fields are full.  Facial symmetry is present. There is good facial sensation to soft touch bilaterally.Facial strength is normal.  Trapezius and sternocleidomastoid strength is normal. No dysarthria is noted.   No obvious hearing deficits are noted.  Motor:  Muscle bulk is normal.   Tone is normal. Strength is  5 / 5 in all 4 extremities.   Sensory: Sensory testing is intact to pinprick, soft touch and vibration sensation in all 4 extremities.  Coordination: Cerebellar testing reveals good finger-nose-finger and heel-to-shin bilaterally.  Gait and station: Station is normal.   Gait is normal for age with a good stride and a 3 step 180 degree turn. Tandem gait is normal for age. Romberg is negative.   Reflexes: Deep tendon reflexes are symmetric and normal bilaterally.   Plantar responses are flexor.    DIAGNOSTIC DATA (LABS, IMAGING, TESTING) - I reviewed patient records, labs, notes, testing and imaging myself where available.  Lab Results  Component Value Date   WBC 6.9 08/03/2011   HGB 14.4 08/03/2011   HCT 43.2 08/03/2011   MCV 88.0 08/03/2011   PLT 296.0 08/03/2011      Component Value Date/Time   Autumn 142 08/03/2011 0738   K 5.1 08/03/2011 0738   CL 106 08/03/2011 0738   CO2 29 08/03/2011 0738   GLUCOSE 95 08/03/2011 0738   BUN 18 08/03/2011 0738   CREATININE 0.90 01/01/2020 1905   CALCIUM 9.6 08/03/2011 0738   PROT 7.3 08/03/2011 0738   ALBUMIN 3.9 08/03/2011 0738   AST 25 08/03/2011 0738   ALT 25 08/03/2011 0738   ALKPHOS 70 08/03/2011 0738   BILITOT 0.5 08/03/2011 0738   Lab Results  Component Value Date   CHOL 161 08/03/2011   HDL 50.90 08/03/2011   LDLCALC 87 08/03/2011   TRIG 117.0 08/03/2011  CHOLHDL 3 08/03/2011       ASSESSMENT AND PLAN  Late onset Alzheimer's disease without behavioral disturbance ([place]) -  Plan: Vitamin B12  Transient ischemic attack - Plan: US Carotid Bilateral  Numbness - Plan: US Carotid Bilateral, Vitamin B12  Memory loss - Plan: Vitamin B12   In summary, Ms. Sydney is an 86 year old woman with a 3-year history of progressive dementia with characteristics of the cognitive test and MRI consistent with Alzheimer's disease.  She is already on galantamine and Namenda.  I discussed with her and the daughter that there are no great medications for Alzheimer's disease.  She is currently on an optimal combination that will hopefully give her some benefit.  Her recent episode lasting 5 to 10 minutes and left-sided weakness most likely represents a transient ischemic attack as she got completely back to baseline.  The MRI of the brain ruled out alternative diagnoses for the memory loss and also ruled out stroke.  I recommend that she take a baby aspirin 81 mg daily.  Additionally, we will check carotid Dopplers to determine if there is not a severe stenosis that should be treated.  I will also check vitamin B12.  She will continue to take vitamin D supplements.  We will met her daughter and her know the results of the carotid Doppler and B12 test.  I did not schedule follow-up but advised her or her daughter to call if there are any new or worsening neurologic symptoms.  Thank you for asking me to see Ms. Grandville Silos.  Please let me know if I can be of further assistance with her or other patients in the future.   Kambrie Eddleman A. Felecia Shelling, MD, [facility] 1/82/0990, 6:89 PM Certified in Neurology, Clinical Neurophysiology, Sleep Medicine and Neuroimaging  [facility] [address] [place], [place] [address] [phone]

## 2020-01-04 LAB — VITAMIN B12: Vitamin B-12: 1495 pg/mL — ABNORMAL HIGH (ref 232–1245)

## 2020-01-05 ENCOUNTER — Ambulatory Visit: Payer: Medicare PPO

## 2020-01-05 ENCOUNTER — Ambulatory Visit: Payer: Medicare PPO | Attending: Internal Medicine

## 2020-01-05 DIAGNOSIS — Z23 Encounter for immunization: Secondary | ICD-10-CM

## 2020-01-05 NOTE — Progress Notes (Signed)
   Covid-19 Vaccination Clinic  Name:  Autumn Wallace    MRN: [id number] DOB: Jun 03, 1932  01/05/2020  [name] was observed post Covid-19 immunization for 15 minutes without incidence. She was provided with Vaccine Information Sheet and instruction to access the V-Safe system.   [name] was instructed to call 911 with any severe reactions post vaccine: Marland Kitchen Difficulty breathing  . Swelling of your face and throat  . A fast heartbeat  . A bad rash all over your body  . Dizziness and weakness    Immunizations Administered    Name Date Dose VIS Date Route   Pfizer COVID-19 Vaccine 01/05/2020  1:45 PM 0.3 mL 11/16/2019 Intramuscular   Manufacturer: [place]   Lot: [id number]   [place]: [id number]

## 2020-01-07 ENCOUNTER — Telehealth: Payer: Self-pay | Admitting: *Deleted

## 2020-01-07 NOTE — Telephone Encounter (Addendum)
Took call from phone staff and spoke with pt about results. I called daughter, Zigmund Daniel. Relayed results. Advised her to call back if they do not hear about getting carotid doppler scheduled. I placed in appt notes for text to call daughter to schedule.

## 2020-01-07 NOTE — Telephone Encounter (Signed)
Called and spoke with daughter. No DPR on file. I checked with phone staff and they do not have copy. I relayed I cannot release info to her d/t not being on DPR. She states they filled this out last week. Advised I will try and locate. She will have pt/husband call back with results.

## 2020-01-07 NOTE — Telephone Encounter (Signed)
-----   Message from Britt Bottom, MD sent at 01/04/2020  7:17 PM EST ----- The B12 level is normal... we will let her know the results of the carotid doppler after it is performed

## 2020-01-08 ENCOUNTER — Ambulatory Visit: Payer: Medicare PPO | Admitting: Neurology

## 2020-01-21 ENCOUNTER — Ambulatory Visit
Admission: RE | Admit: 2020-01-21 | Discharge: 2020-01-21 | Disposition: A | Discharge status: Home / Self Care | Payer: Medicare PPO | Source: Ambulatory Visit | Attending: Internal Medicine | Admitting: Internal Medicine

## 2020-01-21 ENCOUNTER — Other Ambulatory Visit: Payer: Self-pay

## 2020-01-21 DIAGNOSIS — N644 Mastodynia: Secondary | ICD-10-CM

## 2020-01-31 ENCOUNTER — Ambulatory Visit: Payer: Medicare PPO | Admitting: Neurology

## 2020-02-07 ENCOUNTER — Other Ambulatory Visit: Payer: Self-pay | Admitting: *Deleted

## 2020-02-07 DIAGNOSIS — R200 Anesthesia of skin: Secondary | ICD-10-CM

## 2020-02-07 DIAGNOSIS — G459 Transient cerebral ischemic attack, unspecified: Secondary | ICD-10-CM

## 2020-02-13 ENCOUNTER — Encounter (HOSPITAL_COMMUNITY): Payer: Medicare PPO

## 2020-02-21 ENCOUNTER — Other Ambulatory Visit: Payer: Self-pay

## 2020-02-21 ENCOUNTER — Ambulatory Visit (HOSPITAL_COMMUNITY)
Admission: RE | Admit: 2020-02-21 | Discharge: 2020-02-21 | Disposition: A | Discharge status: Home / Self Care | Payer: Medicare PPO | Source: Ambulatory Visit | Attending: Neurology | Admitting: Neurology

## 2020-02-21 DIAGNOSIS — R200 Anesthesia of skin: Secondary | ICD-10-CM | POA: Diagnosis not present

## 2020-02-21 DIAGNOSIS — G459 Transient cerebral ischemic attack, unspecified: Secondary | ICD-10-CM | POA: Diagnosis not present

## 2020-02-25 ENCOUNTER — Telehealth: Payer: Self-pay | Admitting: *Deleted

## 2020-02-25 NOTE — Telephone Encounter (Signed)
-----   Message from Britt Bottom, MD sent at 02/25/2020  4:49 PM EDT ----- Please let her know that the carotid Doppler was normal for her age

## 2020-02-25 NOTE — Telephone Encounter (Signed)
Called daughter, Zigmund Daniel. Relayed results per Dr. Felecia Shelling note. She verbalized understanding.

## 2020-03-14 DIAGNOSIS — F325 Major depressive disorder, single episode, in full remission: Secondary | ICD-10-CM | POA: Diagnosis not present

## 2020-03-14 DIAGNOSIS — K5900 Constipation, unspecified: Secondary | ICD-10-CM | POA: Diagnosis not present

## 2020-03-14 DIAGNOSIS — F0390 Unspecified dementia without behavioral disturbance: Secondary | ICD-10-CM | POA: Diagnosis not present

## 2020-03-14 DIAGNOSIS — Z791 Long term (current) use of non-steroidal anti-inflammatories (NSAID): Secondary | ICD-10-CM | POA: Diagnosis not present

## 2020-03-14 DIAGNOSIS — I10 Essential (primary) hypertension: Secondary | ICD-10-CM | POA: Diagnosis not present

## 2020-04-08 DIAGNOSIS — L720 Epidermal cyst: Secondary | ICD-10-CM | POA: Diagnosis not present

## 2020-04-08 DIAGNOSIS — D1801 Hemangioma of skin and subcutaneous tissue: Secondary | ICD-10-CM | POA: Diagnosis not present

## 2020-04-08 DIAGNOSIS — L570 Actinic keratosis: Secondary | ICD-10-CM | POA: Diagnosis not present

## 2020-04-08 DIAGNOSIS — L821 Other seborrheic keratosis: Secondary | ICD-10-CM | POA: Diagnosis not present

## 2020-04-22 DIAGNOSIS — Z961 Presence of intraocular lens: Secondary | ICD-10-CM | POA: Diagnosis not present

## 2020-04-22 DIAGNOSIS — H52203 Unspecified astigmatism, bilateral: Secondary | ICD-10-CM | POA: Diagnosis not present

## 2020-05-14 DIAGNOSIS — R350 Frequency of micturition: Secondary | ICD-10-CM | POA: Diagnosis not present

## 2020-05-14 DIAGNOSIS — N390 Urinary tract infection, site not specified: Secondary | ICD-10-CM | POA: Diagnosis not present

## 2020-05-14 DIAGNOSIS — R410 Disorientation, unspecified: Secondary | ICD-10-CM | POA: Diagnosis not present

## 2020-06-18 DIAGNOSIS — M810 Age-related osteoporosis without current pathological fracture: Secondary | ICD-10-CM | POA: Diagnosis not present

## 2020-06-18 DIAGNOSIS — R946 Abnormal results of thyroid function studies: Secondary | ICD-10-CM | POA: Diagnosis not present

## 2020-06-18 DIAGNOSIS — D692 Other nonthrombocytopenic purpura: Secondary | ICD-10-CM | POA: Diagnosis not present

## 2020-06-18 DIAGNOSIS — R7301 Impaired fasting glucose: Secondary | ICD-10-CM | POA: Diagnosis not present

## 2020-06-18 DIAGNOSIS — F0390 Unspecified dementia without behavioral disturbance: Secondary | ICD-10-CM | POA: Diagnosis not present

## 2020-07-04 DIAGNOSIS — H6122 Impacted cerumen, left ear: Secondary | ICD-10-CM | POA: Diagnosis not present

## 2020-07-04 DIAGNOSIS — H9192 Unspecified hearing loss, left ear: Secondary | ICD-10-CM | POA: Diagnosis not present

## 2020-07-24 ENCOUNTER — Other Ambulatory Visit: Payer: Self-pay

## 2020-07-24 ENCOUNTER — Ambulatory Visit (INDEPENDENT_AMBULATORY_CARE_PROVIDER_SITE_OTHER): Payer: Medicare PPO | Admitting: Otolaryngology

## 2020-07-24 VITALS — Temp 97.3°F

## 2020-07-24 DIAGNOSIS — H6123 Impacted cerumen, bilateral: Secondary | ICD-10-CM

## 2020-07-24 DIAGNOSIS — H903 Sensorineural hearing loss, bilateral: Secondary | ICD-10-CM | POA: Diagnosis not present

## 2020-07-24 NOTE — Progress Notes (Signed)
HPI: Autumn Wallace is a 86 y.o. female who presents is referred by [facility]  for evaluation of decreased hearing and left ear cerumen impaction.  Ears were cleaned at the PCP office but they were unable to remove all the wax from the left ear canal.  She also notices decreased hearing which is worse on the right side as it feels clogged.  She also complains of intermittent tenderness in her nose with drainage from her nose which is clear..  Past Medical History:  Diagnosis Date  . Acute pancreatitis   . Adenomatous colon polyp 2004  . Allergy    seasonal  . Diverticulosis   . Gallstones   . Hiatal hernia   . Hyperlipidemia   . Pancreatitis    Past Surgical History:  Procedure Laterality Date  . CHOLECYSTECTOMY  2012  . COLONOSCOPY    . DILATION AND CURETTAGE OF UTERUS    . TONSILLECTOMY AND ADENOIDECTOMY     Social History   Socioeconomic History  . Marital status: Married    Spouse name: Gwyndolyn Saxon  . Number of children: 2  . Years of education: 43  . Highest education level: Not on file  Occupational History  . Occupation: Retired  Tobacco Use  . Smoking status: Never Smoker  . Smokeless tobacco: Never Used  Substance and Sexual Activity  . Alcohol use: No  . Drug use: No  . Sexual activity: Not on file  Other Topics Concern  . Not on file  Social History Narrative   Lives with husband   Right handed    1 cup coffee per day   Social Determinants of Health   Financial Resource Strain:   . Difficulty of Paying Living Expenses: Not on file  Food Insecurity:   . Worried About Charity fundraiser in the Last Year: Not on file  . Ran Out of Food in the Last Year: Not on file  Transportation Needs:   . Lack of Transportation (Medical): Not on file  . Lack of Transportation (Non-Medical): Not on file  Physical Activity:   . Days of Exercise per Week: Not on file  . Minutes of Exercise per Session: Not on file  Stress:   . Feeling of Stress : Not on file  Social  Connections:   . Frequency of Communication with Friends and Family: Not on file  . Frequency of Social Gatherings with Friends and Family: Not on file  . Attends Religious Services: Not on file  . Active Member of Clubs or Organizations: Not on file  . Attends Archivist Meetings: Not on file  . Marital Status: Not on file   Family History  Problem Relation Age of Onset  . Breast cancer Sister   . Colon cancer Neg Hx    Allergies  Allergen Reactions  . Latex Swelling   Prior to Admission medications   Medication Sig Start Date End Date Taking? Authorizing Provider  escitalopram (LEXAPRO) 10 MG tablet Take 10 mg by mouth daily. 08/12/19  Yes [provider]  galantamine (RAZADYNE ER) 16 MG 24 hr capsule Take 16 mg by mouth daily. 07/29/19  Yes [provider]  memantine (NAMENDA) 10 MG tablet Take 10 mg by mouth 2 (two) times daily. 08/07/19  Yes [provider]  metoprolol tartrate (LOPRESSOR) 25 MG tablet Take 0.5 tablets (12.5 mg total) by mouth 2 (two) times daily. 08/17/19  Yes Meng, Isaac Laud, PA     Positive ROS: Otherwise negative  All  other systems have been reviewed and were otherwise negative with the exception of those mentioned in the HPI and as above.  Physical Exam: Constitutional: Alert, well-appearing, no acute distress Ears: External ears without lesions or tenderness.  She has small ear canals bilaterally.  Ear canals were cleaned with forceps and curettes.  The TMs are clear bilaterally with good mobility on pneumatic otoscopy.  On hearing screening with the tuning forks she has a moderate right ear SNHL and a mild to moderate left ear SNHL using the 1024 tuning fork. Nasal: External nose without lesions. Septum midline with mild rhinitis and clear mucus discharge.  Both middle meatus regions are clear with no signs of infection.. Oral: Lips and gums without lesions. Tongue and palate mucosa without lesions. Posterior oropharynx  clear. Neck: No palpable adenopathy or masses Respiratory: Breathing comfortably  Skin: No facial/neck lesions or rash noted.  Cerumen impaction removal  Date/Time: 07/24/2020 2:43 PM Performed by: Rozetta Nunnery, MD Authorized by: Rozetta Nunnery, MD   Consent:    Consent obtained:  Verbal   Consent given by:  Patient   Risks discussed:  Pain and bleeding Procedure details:    Location:  L ear and R ear   Procedure type: curette and forceps   Post-procedure details:    Inspection:  TM intact and canal normal   Hearing quality:  Improved   Patient tolerance of procedure:  Tolerated well, no immediate complications Comments:     TMs appear clear bilaterally.    Assessment: Mild rhinitis with no signs of infection. Sensorineural hearing loss which is more pronounced on the right side  Plan: Recommended use of saline irrigation for the nose and nasal drainage. We will schedule her for audiologic testing and have her follow-up following audiologic testing.   Radene Journey, MD   CC:

## 2020-08-23 ENCOUNTER — Other Ambulatory Visit: Payer: Self-pay | Admitting: Physician Assistant

## 2020-08-23 DIAGNOSIS — I471 Supraventricular tachycardia: Secondary | ICD-10-CM

## 2020-10-01 DIAGNOSIS — H0100B Unspecified blepharitis left eye, upper and lower eyelids: Secondary | ICD-10-CM | POA: Diagnosis not present

## 2020-10-01 DIAGNOSIS — H00014 Hordeolum externum left upper eyelid: Secondary | ICD-10-CM | POA: Diagnosis not present

## 2020-10-01 DIAGNOSIS — H0100A Unspecified blepharitis right eye, upper and lower eyelids: Secondary | ICD-10-CM | POA: Diagnosis not present

## 2020-10-22 DIAGNOSIS — F0390 Unspecified dementia without behavioral disturbance: Secondary | ICD-10-CM | POA: Diagnosis not present

## 2020-10-22 DIAGNOSIS — N182 Chronic kidney disease, stage 2 (mild): Secondary | ICD-10-CM | POA: Diagnosis not present

## 2020-10-22 DIAGNOSIS — N390 Urinary tract infection, site not specified: Secondary | ICD-10-CM | POA: Diagnosis not present

## 2020-10-22 DIAGNOSIS — R410 Disorientation, unspecified: Secondary | ICD-10-CM | POA: Diagnosis not present

## 2020-10-22 DIAGNOSIS — I129 Hypertensive chronic kidney disease with stage 1 through stage 4 chronic kidney disease, or unspecified chronic kidney disease: Secondary | ICD-10-CM | POA: Diagnosis not present

## 2020-10-22 DIAGNOSIS — R350 Frequency of micturition: Secondary | ICD-10-CM | POA: Diagnosis not present

## 2020-10-28 ENCOUNTER — Encounter: Payer: Self-pay | Admitting: Internal Medicine

## 2020-10-28 ENCOUNTER — Other Ambulatory Visit: Payer: Self-pay

## 2020-10-28 ENCOUNTER — Ambulatory Visit: Payer: Medicare PPO | Admitting: Internal Medicine

## 2020-10-28 VITALS — BP 124/74 | HR 51 | Temp 93.4°F | Ht 63.0 in | Wt 125.2 lb

## 2020-10-28 DIAGNOSIS — I471 Supraventricular tachycardia: Secondary | ICD-10-CM | POA: Diagnosis not present

## 2020-10-28 DIAGNOSIS — E785 Hyperlipidemia, unspecified: Secondary | ICD-10-CM | POA: Diagnosis not present

## 2020-10-28 NOTE — Progress Notes (Signed)
OFFICE NOTE  Chief Complaint:  No complaints  Primary Care Physician: Crist Infante, MD  HPI:  Autumn Wallace  Is a very pleasant 86 year old female who appears much younger than stated age. Past medical history significant for mild dyslipidemia on Lipitor and some orthopedic problems as well as osteoporosis. Otherwise she seems to be fairly healthy. She exercises 5 days a week doing both weights and cardio. She was referred for evaluation of an episode of mid substernal chest discomfort which occurred after doing some unusual exercises. There may been some palpitations associated with this but that quickly resolved and she's had no further complaints. She continues to be able to exercise without any restrictions. There is no known history of cardiovascular disease in the family. She's never had any cardiac events. She has no history of hypertension, diabetes or any significant risk factors for coronary disease. She is not a smoker. A screening at fourth sleepiness scale was negative for any sleep problems. EKG in the office today shows normal sinus rhythm at 60 without any ischemic changes.  08/24/2016  Autumn Wallace returns today for follow-up. When I last saw her it was felt that her chest pain was noncardiac. She did well over the past year however this past Sunday she had an episode of chest discomfort which radiated to her left arm. It was dull and lasted for about 5-10 minutes and awakened her from sleep. There were some associated palpitations. This went away fairly quickly. She's had no symptoms with exercise. She does not regularly get palpitations and has not had left arm pain symptoms in the past either. EKG today shows sinus bradycardia 59 with no ischemic changes  09/09/2016  [facility] returns for follow-up today. Her stress test demonstrated normal LV function and no ischemia. Her Holter monitor did pick up on several episodes of PSVT which was limited to less than 20 beats.  Subsequently after turning in her monitor she had a more sustained episode for which she was symptomatic. This was the same episode that she had originally had. Management may be complicated by bradycardia she's had some heart rates in the upper 50s and low 60s but she may tolerate low-dose beta blocker.  11/04/2016  Autumn Wallace was seen today in follow-up. She seems to be tolerating low-dose beta blocker. She's had no recurrent SVT. She is very pleased with the medication. She is a self-reported person who is not interested in taking medications however she feels like this medicine is helpful and will take it. She also reported to me that she is not taking her Boniva or calcium and rarely takes her cholesterol medicine. I told her that this is important information for her primary care provider to be aware of.  06/03/2017  Autumn Wallace was seen today in follow-up. She is now reporting chest pain. She says she feels some heaviness in her chest. Her husband noted that recently she's had some decreased energy and seems to fatigue easier. She also has some left arm pain when lifting things. She's decreased her exercise due to this. She reports some recurrent palpitations and tachycardia. In September of last year she had a stress test as well as a monitor. The stress test was negative for ischemia and showed an EF of 75%. Her monitor demonstrated recurrent SVT. This is despite being on beta blocker with heart rate in the 50s. Is not clear that there is room to necessarily increase the dose of her beta blocker. She describes chest  pain is sharp in the left anterior chest. It can come at rest or with exertion.  07/27/2017  [facility] returns today for follow-up. She underwent a stress test which was negative for ischemia and showed normal LV function. Her monitor shows no evidence of SVT during this. Although she says she still gets occasional symptoms. In general she had mostly bradycardia and tends to  have a low blood pressure therefore I would not increase her beta blocker any further. I reassured her that these episodes are not life-threatening.  07/28/2018  Autumn Wallace was seen today in follow-up.  Overall doing well.  She denies any significant recurrent SVT although occasionally gets palpitations.  EKG shows sinus bradycardia today at 52 but otherwise no ischemic changes.  She denies any chest pain or shortness of breath.  She does do regular walking.  I reviewed labs from her primary care provider in September showed total cholesterol 180, HDL 49, LDL 119 and triglycerides 60.  Hemoglobin A1c is 5.6 and she has no history of diabetes or hypertension.  Her goal LDL is less than 130 on low-dose atorvastatin.  10/28/2020  [facility] returns today for follow-up with her daughter.  Overall she is well without any new complaints.  According to her daughter however her memory loss is accelerated somewhat.  There is been no evidence for recurrent SVT or palpitations.  She has had no complaints with that.  She denies any syncope, chest pain or worsening shortness of breath.  She has followed up fairly regularly with her PCP.  Blood pressure is excellent today.  She is only on low-dose metoprolol but occasionally forgets the medication.  Also her daughter notes is difficult for her to take some of the small half milligram metoprolol pills.  PMHx:  Past Medical History:  Diagnosis Date   Acute pancreatitis    Adenomatous colon polyp 2004   Allergy    seasonal   Diverticulosis    Gallstones    Hiatal hernia    Hyperlipidemia    Pancreatitis     Past Surgical History:  Procedure Laterality Date   CHOLECYSTECTOMY  2012   COLONOSCOPY     DILATION AND CURETTAGE OF UTERUS     TONSILLECTOMY AND ADENOIDECTOMY      FAMHx:  Family History  Problem Relation Age of Onset   Breast cancer Sister    Colon cancer Neg Hx     SOCHx:   reports that she has never smoked. She has  never used smokeless tobacco. She reports that she does not drink alcohol and does not use drugs.  ALLERGIES:  Allergies  Allergen Reactions   Latex Swelling    ROS: Pertinent items noted in HPI and remainder of comprehensive ROS otherwise negative.  HOME MEDS: Current Outpatient Medications  Medication Sig Dispense Refill   escitalopram (LEXAPRO) 10 MG tablet Take 10 mg by mouth daily.     galantamine (RAZADYNE ER) 16 MG 24 hr capsule Take 16 mg by mouth daily.     memantine (NAMENDA) 10 MG tablet Take 10 mg by mouth 2 (two) times daily.     metoprolol tartrate (LOPRESSOR) 25 MG tablet Take 0.5 tablets (12.5 mg total) by mouth 2 (two) times daily. 90 tablet 3   No current facility-administered medications for this visit.    LABS/IMAGING: No results found for this or any previous visit (from the past 48 hour(s)). No results found.  WEIGHTS: Wt Readings from Last 3 Encounters:  10/28/20 125 lb 3.2  oz (56.8 kg)  01/03/20 136 lb 8 oz (61.9 kg)  08/17/19 135 lb 12.8 oz (61.6 kg)    VITALS: BP 124/74    Pulse (!) 51    Temp (!) 93.4 F (34.1 C)    Ht 5\' 3"  (1.6 m)    Wt 125 lb 3.2 oz (56.8 kg)    SpO2 96%    BMI 22.18 kg/m   EXAM: General appearance: alert and no distress Neck: no carotid bruit and no JVD Lungs: clear to auscultation bilaterally Heart: regular rate and rhythm Abdomen: soft, non-tender; bowel sounds normal; no masses,  no organomegaly Extremities: extremities normal, atraumatic, no cyanosis or edema Pulses: 2+ and symmetric Skin: Skin color, texture, turgor normal. No rashes or lesions Neurologic: Grossly normal Psych: Pleasant  EKG: Sinus bradycardia at 51-personally reviewed  ASSESSMENT: 1. Recurrent chest pain and fatigue -  low risk myoview with normal LV function (06/2017) 2. History of PSVT - no recent findings on the monitor 3. Palpitations 4. Dyslipidemia - at goal  5. Progressive dementia  PLAN: 1.   Autumn Wallace seems to be doing  well without recurrent SVT or palpitations.  Unfortunately dementia has progressed and her memory loss has become more significant.  It is difficult sometimes for her to take the very small pills and her daughter requested an alternative.  I think it would be reasonable to consider 1/2 tablet of Toprol-XL once daily at night.  This may be a little easier to take and would represent a decrease in her total medication dose.  This is also related the fact that she has had ongoing weight loss with decreased appetite related to dementia.  I am concerned about her becoming too bradycardic or hypotensive on the medication in the future.  Follow-up with me annually or sooner as necessary.  Pixie Casino, MD, [facility], [place] Director of the Advanced Lipid Disorders &  Cardiovascular Risk Reduction Clinic Diplomate of the American Board of Clinical Lipidology Attending Cardiologist  Direct Dial: [phone]   Fax: [phone]  Website:  [email or web address] 10/28/2020, 11:21 AM

## 2020-10-28 NOTE — Patient Instructions (Addendum)
Medication Instructions:  DECREASE- Metoprolol Tartrate 12.5 mg by mouth daily at bedtime  *If you need a refill on your cardiac medications before your next appointment, please call your pharmacy*   Lab Work: None Ordered   Testing/Procedures: None Ordered   Follow-Up: At [facility], you and your health needs are our priority.  As part of our continuing mission to provide you with exceptional heart care, we have created designated Provider Care Teams.  These Care Teams include your primary Cardiologist (physician) and Advanced Practice Providers (APPs -  Physician Assistants and Nurse Practitioners) who all work together to provide you with the care you need, when you need it.  We recommend signing up for the patient portal called "MyChart".  Sign up information is provided on this After Visit Summary.  MyChart is used to connect with patients for Virtual Visits (Telemedicine).  Patients are able to view lab/test results, encounter notes, upcoming appointments, etc.  Non-urgent messages can be sent to your provider as well.   To learn more about what you can do with MyChart, go to [email or web address].    Your next appointment:   1 year(s)  The format for your next appointment:   In Person  Provider:   You may see Pixie Casino, MD or one of the following Advanced Practice Providers on your designated Care Team:    Almyra Deforest, PA-C  Fabian Sharp, PA-C or   Roby Lofts, [place]

## 2020-11-21 ENCOUNTER — Other Ambulatory Visit: Payer: Self-pay | Admitting: Physician Assistant

## 2020-12-03 DIAGNOSIS — H0100A Unspecified blepharitis right eye, upper and lower eyelids: Secondary | ICD-10-CM | POA: Diagnosis not present

## 2020-12-03 DIAGNOSIS — H0014 Chalazion left upper eyelid: Secondary | ICD-10-CM | POA: Diagnosis not present

## 2020-12-03 DIAGNOSIS — H0100B Unspecified blepharitis left eye, upper and lower eyelids: Secondary | ICD-10-CM | POA: Diagnosis not present

## 2020-12-16 DIAGNOSIS — S62002A Unspecified fracture of navicular [scaphoid] bone of left wrist, initial encounter for closed fracture: Secondary | ICD-10-CM | POA: Diagnosis not present

## 2020-12-16 DIAGNOSIS — M25532 Pain in left wrist: Secondary | ICD-10-CM | POA: Diagnosis not present

## 2020-12-16 DIAGNOSIS — S8001XA Contusion of right knee, initial encounter: Secondary | ICD-10-CM | POA: Diagnosis not present

## 2020-12-22 ENCOUNTER — Emergency Department (HOSPITAL_COMMUNITY)
Admission: EM | Admit: 2020-12-22 | Discharge: 2020-12-22 | Disposition: A | Discharge status: Home / Self Care | Payer: Medicare PPO | Attending: Emergency Medicine | Admitting: Emergency Medicine

## 2020-12-22 ENCOUNTER — Emergency Department (HOSPITAL_COMMUNITY): Payer: Medicare PPO

## 2020-12-22 ENCOUNTER — Other Ambulatory Visit: Payer: Self-pay

## 2020-12-22 DIAGNOSIS — R531 Weakness: Secondary | ICD-10-CM | POA: Diagnosis not present

## 2020-12-22 DIAGNOSIS — R42 Dizziness and giddiness: Secondary | ICD-10-CM | POA: Diagnosis not present

## 2020-12-22 DIAGNOSIS — F0390 Unspecified dementia without behavioral disturbance: Secondary | ICD-10-CM | POA: Insufficient documentation

## 2020-12-22 DIAGNOSIS — R4182 Altered mental status, unspecified: Secondary | ICD-10-CM | POA: Diagnosis not present

## 2020-12-22 DIAGNOSIS — Z20822 Contact with and (suspected) exposure to covid-19: Secondary | ICD-10-CM | POA: Diagnosis not present

## 2020-12-22 DIAGNOSIS — R0602 Shortness of breath: Secondary | ICD-10-CM | POA: Diagnosis not present

## 2020-12-22 DIAGNOSIS — W06XXXA Fall from bed, initial encounter: Secondary | ICD-10-CM | POA: Insufficient documentation

## 2020-12-22 DIAGNOSIS — R112 Nausea with vomiting, unspecified: Secondary | ICD-10-CM | POA: Diagnosis not present

## 2020-12-22 DIAGNOSIS — R9431 Abnormal electrocardiogram [ECG] [EKG]: Secondary | ICD-10-CM | POA: Diagnosis not present

## 2020-12-22 DIAGNOSIS — R410 Disorientation, unspecified: Secondary | ICD-10-CM | POA: Diagnosis not present

## 2020-12-22 LAB — COMPREHENSIVE METABOLIC PANEL
ALT: 16 U/L (ref 0–44)
AST: 26 U/L (ref 15–41)
Albumin: 3.3 g/dL — ABNORMAL LOW (ref 3.5–5.0)
Alkaline Phosphatase: 73 U/L (ref 38–126)
Anion gap: 9 (ref 5–15)
BUN: 14 mg/dL (ref 8–23)
CO2: 25 mmol/L (ref 22–32)
Calcium: 9.7 mg/dL (ref 8.9–10.3)
Chloride: 102 mmol/L (ref 98–111)
Creatinine, Ser: 0.85 mg/dL (ref 0.44–1.00)
GFR, Estimated: >60 mL/min (ref >60)
Glucose, Bld: 116 mg/dL — ABNORMAL HIGH (ref 70–99)
Potassium: 4.2 mmol/L (ref 3.5–5.1)
Sodium: 136 mmol/L (ref 135–145)
Total Bilirubin: 0.8 mg/dL (ref 0.3–1.2)
Total Protein: 7.1 g/dL (ref 6.5–8.1)

## 2020-12-22 LAB — CBC WITH DIFFERENTIAL/PLATELET
Abs Immature Granulocytes: 0.05 10*3/uL (ref 0.00–0.07)
Basophils Absolute: 0.1 10*3/uL (ref 0.0–0.1)
Basophils Relative: 1 %
Eosinophils Absolute: 0.2 10*3/uL (ref 0.0–0.5)
Eosinophils Relative: 2 %
HCT: 41.8 % (ref 36.0–46.0)
Hemoglobin: 13.4 g/dL (ref 12.0–15.0)
Immature Granulocytes: 1 %
Lymphocytes Relative: 12 %
Lymphs Abs: 1.3 10*3/uL (ref 0.7–4.0)
MCH: 29.1 pg (ref 26.0–34.0)
MCHC: 32.1 g/dL (ref 30.0–36.0)
MCV: 90.9 fL (ref 80.0–100.0)
Monocytes Absolute: 0.9 10*3/uL (ref 0.1–1.0)
Monocytes Relative: 8 %
Neutro Abs: 8.3 10*3/uL — ABNORMAL HIGH (ref 1.7–7.7)
Neutrophils Relative %: 76 %
Platelets: 310 10*3/uL (ref 150–400)
RBC: 4.6 MIL/uL (ref 3.87–5.11)
RDW: 12.1 % (ref 11.5–15.5)
WBC: 10.9 10*3/uL — ABNORMAL HIGH (ref 4.0–10.5)
nRBC: 0 % (ref 0.0–0.2)

## 2020-12-22 LAB — URINALYSIS, ROUTINE W REFLEX MICROSCOPIC
Bilirubin Urine: NEGATIVE
Glucose, UA: NEGATIVE mg/dL
Hgb urine dipstick: NEGATIVE
Ketones, ur: NEGATIVE mg/dL
Nitrite: NEGATIVE
Protein, ur: NEGATIVE mg/dL
Specific Gravity, Urine: 1.019 (ref 1.005–1.030)
pH: 5 (ref 5.0–8.0)

## 2020-12-22 LAB — RESP PANEL BY RT-PCR (FLU A&B, COVID) ARPGX2
Influenza A by PCR: NEGATIVE
Influenza B by PCR: NEGATIVE
SARS Coronavirus 2 by RT PCR: NEGATIVE

## 2020-12-22 MED ORDER — CEPHALEXIN 500 MG PO CAPS: 500.0000 mg | ORAL_CAPSULE | Freq: Four times a day (QID) | ORAL | 0 refills | Status: AC

## 2020-12-22 NOTE — ED Provider Notes (Addendum)
[facility] Provider Note   CSN: [id number] Arrival date & time: 12/22/20  1725     History Chief Complaint  Patient presents with  . Altered Mental Status    Autumn Wallace is a 87 y.o. female.  HPI  I spoke to patient's daughter over the phone.  She states she fell 6 days ago.  She has bruising around both eyes as well as a brace on the left wrist.  She states her wrist was evaluated by Raliegh Ip.  She was told it was likely a hairline fracture.  She was placed in a wrist splint and has follow-up tomorrow.  She had ENT follow-up tomorrow.  Has not had any imaging of her head.  Not anticoagulated.  She states that they were helping her ambulate due to her pain.  About 2 days ago she had another fall out of bed and needed to be helped back in bed by the local fire department.  Her daughter states that she is demented and has memory issues at baseline but will normally ambulate on her own, feed herself and converse.  For the past 2 days she has not been speaking clearly.  Patient has been having more more difficulty with ambulation and is becoming weaker.  She states that she was nonambulatory today and was also not conversing with them. She feels that she is also eating and drinking less.  Her daughter does note that her urine appears darker than normal and has a much stronger odor.  Denies any URI symptoms.  She is vaccinated for COVID-19 x3.  Level 5 caveat due to altered mental status     Past Medical History:  Diagnosis Date  . Acute pancreatitis   . Adenomatous colon polyp 2004  . Allergy    seasonal  . Diverticulosis   . Gallstones   . Hiatal hernia   . Hyperlipidemia   . Pancreatitis     Patient Active Problem List   Diagnosis Date Noted  . Late onset Alzheimer's disease without behavioral disturbance ([place]) 01/03/2020  . Transient ischemic attack 01/03/2020  . Numbness 01/03/2020  . Strain of left trapezius muscle  11/17/2016  . PSVT (paroxysmal supraventricular tachycardia) ([place]) 09/09/2016  . Palpitations 08/24/2016  . Dyslipidemia 08/28/2015  . Screening for cardiovascular condition 08/28/2015  . Chest pain 08/28/2015  . Knee pain, right 06/25/2015  . Osteopenia 06/20/2012  . Gallstone pancreatitis 08/12/2011  . Compression fracture of lumbosacral spine ([place]) 06/23/2010  . METATARSALGIA 06/15/2010  . Tendonitis, Achilles, left 06/15/2010    Past Surgical History:  Procedure Laterality Date  . CHOLECYSTECTOMY  2012  . COLONOSCOPY    . DILATION AND CURETTAGE OF UTERUS    . TONSILLECTOMY AND ADENOIDECTOMY       OB History   No obstetric history on file.     Family History  Problem Relation Age of Onset  . Breast cancer Sister   . Colon cancer Neg Hx     Social History   Tobacco Use  . Smoking status: Never Smoker  . Smokeless tobacco: Never Used  Substance Use Topics  . Alcohol use: No  . Drug use: No    Home Medications Prior to Admission medications   Medication Sig Start Date End Date Taking? Authorizing Provider  cephALEXin (KEFLEX) 500 MG capsule Take 1 capsule (500 mg total) by mouth 4 (four) times daily for 7 days. 12/22/20 12/29/20 Yes Rayna Sexton, PA-C  escitalopram (LEXAPRO) 10 MG tablet Take  10 mg by mouth daily. 08/12/19   [provider]  galantamine (RAZADYNE ER) 16 MG 24 hr capsule Take 16 mg by mouth daily. 07/29/19   [provider]  memantine (NAMENDA) 10 MG tablet Take 10 mg by mouth 2 (two) times daily. 08/07/19   [provider]  metoprolol tartrate (LOPRESSOR) 25 MG tablet TAKE 1/2 TABLET BY MOUTH TWICE DAILY 11/21/20   Hilty, Nadean Corwin, MD    Allergies    Latex  Review of Systems   Review of Systems  Unable to perform ROS: Mental status change   Physical Exam Updated Vital Signs BP (!) 140/103   Pulse 68   Temp 98.1 F (36.7 C) (Oral)   Resp 17   SpO2 94%   Physical Exam Vitals and nursing note reviewed.   Constitutional:      General: She is not in acute distress.    Appearance: Normal appearance. She is normal weight. She is not ill-appearing, toxic-appearing or diaphoretic.  HENT:     Head: Normocephalic and atraumatic.     Right Ear: External ear normal.     Left Ear: External ear normal.     Nose: Nose normal.     Mouth/Throat:     Mouth: Mucous membranes are moist.     Pharynx: Oropharynx is clear. No oropharyngeal exudate or posterior oropharyngeal erythema.  Eyes:     Extraocular Movements: Extraocular movements intact.  Cardiovascular:     Rate and Rhythm: Normal rate and regular rhythm.     Pulses: Normal pulses.     Heart sounds: Normal heart sounds. No murmur heard. No friction rub. No gallop.   Pulmonary:     Effort: Pulmonary effort is normal. No respiratory distress.     Breath sounds: Normal breath sounds. No stridor. No wheezing, rhonchi or rales.  Abdominal:     General: Abdomen is flat.     Tenderness: There is no abdominal tenderness.  Musculoskeletal:        General: Normal range of motion.     Cervical back: Normal range of motion and neck supple. No tenderness.  Skin:    General: Skin is warm and dry.  Neurological:     Comments: Altered mental status.  Patient not providing clear answers to questions.  Patient speaks unintelligibly.  Will sometimes follow commands when asked but other times will ignore you.  Seems to be able to move all 4 extremities.  EMS notes that patient was ambulatory with assistance.  Psychiatric:        Mood and Affect: Mood normal.        Behavior: Behavior normal.    ED Results / Procedures / Treatments   Labs (all labs ordered are listed, but only abnormal results are displayed) Labs Reviewed  COMPREHENSIVE METABOLIC PANEL - Abnormal; Notable for the following components:      Result Value   Glucose, Bld 116 (*)    Albumin 3.3 (*)    All other components within normal limits  CBC WITH DIFFERENTIAL/PLATELET - Abnormal;  Notable for the following components:   WBC 10.9 (*)    Neutro Abs 8.3 (*)    All other components within normal limits  URINALYSIS, ROUTINE W REFLEX MICROSCOPIC - Abnormal; Notable for the following components:   Leukocytes,Ua SMALL (*)    Bacteria, UA RARE (*)    All other components within normal limits  RESP PANEL BY RT-PCR (FLU A&B, COVID) ARPGX2  URINE CULTURE   EKG EKG Interpretation  Date/Time:  Monday December 22 2020 17:35:44 EST Ventricular Rate:  69 PR Interval:    QRS Duration: 93 QT Interval:  411 QTC Calculation: 444 R Axis:   12 Text Interpretation: Sinus rhythm Low voltage, precordial leads Artifact in lead(s) I III aVR aVL aVF since last tracing no significant change Confirmed by Daleen Bo [phone]) on 12/22/2020 5:44:09 PM   Radiology CT Head Wo Contrast  Result Date: 12/22/2020 CLINICAL DATA:  Altered mental status. EXAM: CT HEAD WITHOUT CONTRAST TECHNIQUE: Contiguous axial images were obtained from the base of the skull through the vertex without intravenous contrast. COMPARISON:  MR head, dated January 01, 2020. FINDINGS: Brain: There is mild cerebral atrophy with widening of the extra-axial spaces and ventricular dilatation. There are areas of decreased attenuation within the white matter tracts of the supratentorial brain, consistent with microvascular disease changes. Vascular: No hyperdense vessel or unexpected calcification. Skull: Chronic bilateral mildly displaced nasal bone fractures are seen. Sinuses/Orbits: No acute finding. Other: None. IMPRESSION: 1. Generalized cerebral atrophy. 2. No acute intracranial abnormality. Electronically Signed   By: Virgina Norfolk M.D.   On: 12/22/2020 18:31   DG Chest Portable 1 View  Result Date: 12/22/2020 CLINICAL DATA:  Altered mental status EXAM: PORTABLE CHEST 1 VIEW COMPARISON:  None. FINDINGS: The heart size and mediastinal contours are within normal limits. Aortic atherosclerosis. Both lungs are clear. The  visualized skeletal structures are unremarkable. IMPRESSION: No active disease. Electronically Signed   By: Donavan Foil M.D.   On: 12/22/2020 18:05    Procedures Procedures (including critical care time)  Medications Ordered in ED Medications - No data to display  ED Course  I have reviewed the triage vital signs and the nursing notes.  Pertinent labs & imaging results that were available during my care of the patient were reviewed by me and considered in my medical decision making (see chart for details).  Clinical Course as of 12/22/20 2118  Mon Dec 22, 2020  1830 DG Chest Portable 1 View No active disease. [LJ]  2057 Chalmers Guest): SMALL [LJ]  2057 Bacteria, UA(!): RARE [LJ]    Clinical Course User Index [LJ] Rayna Sexton, PA-C   MDM Rules/Calculators/A&P                          Patient is a 87 year old female who presents to the emergency department with her daughter due to what appears to be altered mental status for the past 2 days.  Patient had a fall 6 days ago.  She was evaluated by orthopedics and diagnosed with a hairline fracture of the left wrist.  She has follow-up tomorrow with ENT for the trauma to her face.  She has follow-up tomorrow for reevaluation of her left wrist with orthopedics.  Her work-up today was reassuring.  CT of her head shows no acute intracranial abnormality.  Chest x-ray is negative.  She is vaccinated for COVID-19 x3.  Her COVID-19 test today is negative.  CBC shows mild leukocytosis at 10.9.  CMP showing electrolytes within normal limits.  Glucose of 116.  Normal kidney function. Not uremic. Hemoglobin WNL. No significant change on ECG.  LFTs WNL.  Urinalysis shows small leukocytes and rare bacteria.  11-20 white blood cells.  Her daughter does note that her urine is darker than normal and has a foul smell to it.  Will discharge on 1 week of Keflex.    Unsure of the cause of patient's symptoms.  She does  have a history of dementia at  baseline.  Possibly related to her dementia?  Patient's daughter is going to take her home with her tonight and monitor her.  She was given very strict return precautions and she does understand that she needs to return her to the emergency department for reevaluation if her symptoms worsen or do not improve.  She is going to have her evaluated tomorrow by ENT as well as orthopedics.  She has PCP follow-up in 1 week.  She understands that after she finishes her antibiotics that she should have her UA repeated at this visit.  Her questions were answered and she was amicable at the time of discharge.  Pt discussed with and evaluated by my attending physician Dr. Christ Kick who is in agreement with the above plan.   Final Clinical Impression(s) / ED Diagnoses Final diagnoses:  Altered mental status, unspecified altered mental status type    Rx / DC Orders ED Discharge Orders         Ordered    cephALEXin (KEFLEX) 500 MG capsule  4 times daily        12/22/20 2110           Rayna Sexton, PA-C 12/22/20 2118    Rayna Sexton, PA-C 12/22/20 2121    Daleen Bo, MD 12/23/20 1623

## 2020-12-22 NOTE — ED Triage Notes (Signed)
Per [facility] patient has been more altered than usual. Patient had a fall last week, with bruising noted to right under eye.   Patient alert when arrived and VSS, denies any pain at this time.   Unsure of what baseline is, patient does have hx of dementia.   Family also report lethargy and poor PO intake

## 2020-12-22 NOTE — ED Provider Notes (Signed)
  Face-to-face evaluation   History: Patient here for evaluation of waxing and waning confusion over the last several days.  She is recovering from injuries after fall about a week ago.  She has a fracture of her left wrist.  She supposed to follow-up with orthopedics, tomorrow.  She and her husband live together.  Her daughter who is here with the patient now, helps keep an eye on her.  Physical exam: Elderly patient was alert, and cooperative but confused.  She has subacute appearing bruising of the right suborbital region.  No dysarthria or aphasia.  She is somewhat restless.  Medical screening examination/treatment/procedure(s) were conducted as a shared visit with non-physician practitioner(s) and myself.  I personally evaluated the patient during the encounter    Daleen Bo, MD 12/23/20 1623

## 2020-12-22 NOTE — ED Notes (Signed)
Discharged with family and scripts  IV removed

## 2020-12-22 NOTE — Discharge Instructions (Addendum)
Like we discussed, I am prescribing Glenn an antibiotic called Keflex.  You need to give this to [place] 4 times per day for the next week.  Do not stop giving her this antibiotic early.  Please make sure that she takes this medication in its entirety.  Please follow-up with her ENT as well as her orthopedist at her scheduled appointment tomorrow.  Also, please make sure that she follows up with her primary care doctor next week at her regularly scheduled appointment.  You need to make sure that they repeat her urinalysis at this visit as well.  If her symptoms worsen, you can always return to the emergency department for reevaluation.  It was a pleasure to meet you.

## 2020-12-24 LAB — URINE CULTURE: Culture: NO GROWTH

## 2020-12-25 DIAGNOSIS — S0993XA Unspecified injury of face, initial encounter: Secondary | ICD-10-CM | POA: Diagnosis not present

## 2020-12-31 DIAGNOSIS — M810 Age-related osteoporosis without current pathological fracture: Secondary | ICD-10-CM | POA: Diagnosis not present

## 2020-12-31 DIAGNOSIS — E010 Iodine-deficiency related diffuse (endemic) goiter: Secondary | ICD-10-CM | POA: Diagnosis not present

## 2020-12-31 DIAGNOSIS — R7301 Impaired fasting glucose: Secondary | ICD-10-CM | POA: Diagnosis not present

## 2020-12-31 DIAGNOSIS — Z0000 Encounter for general adult medical examination without abnormal findings: Secondary | ICD-10-CM | POA: Diagnosis not present

## 2020-12-31 DIAGNOSIS — M859 Disorder of bone density and structure, unspecified: Secondary | ICD-10-CM | POA: Diagnosis not present

## 2020-12-31 DIAGNOSIS — E785 Hyperlipidemia, unspecified: Secondary | ICD-10-CM | POA: Diagnosis not present

## 2020-12-31 DIAGNOSIS — M25532 Pain in left wrist: Secondary | ICD-10-CM | POA: Diagnosis not present

## 2020-12-31 DIAGNOSIS — R946 Abnormal results of thyroid function studies: Secondary | ICD-10-CM | POA: Diagnosis not present

## 2021-01-07 DIAGNOSIS — D692 Other nonthrombocytopenic purpura: Secondary | ICD-10-CM | POA: Diagnosis not present

## 2021-01-07 DIAGNOSIS — M79652 Pain in left thigh: Secondary | ICD-10-CM | POA: Diagnosis not present

## 2021-01-07 DIAGNOSIS — E010 Iodine-deficiency related diffuse (endemic) goiter: Secondary | ICD-10-CM | POA: Diagnosis not present

## 2021-01-07 DIAGNOSIS — R413 Other amnesia: Secondary | ICD-10-CM | POA: Diagnosis not present

## 2021-01-07 DIAGNOSIS — F0390 Unspecified dementia without behavioral disturbance: Secondary | ICD-10-CM | POA: Diagnosis not present

## 2021-01-07 DIAGNOSIS — M25562 Pain in left knee: Secondary | ICD-10-CM | POA: Diagnosis not present

## 2021-01-07 DIAGNOSIS — M79641 Pain in right hand: Secondary | ICD-10-CM | POA: Diagnosis not present

## 2021-01-07 DIAGNOSIS — M810 Age-related osteoporosis without current pathological fracture: Secondary | ICD-10-CM | POA: Diagnosis not present

## 2021-01-07 DIAGNOSIS — Z0000 Encounter for general adult medical examination without abnormal findings: Secondary | ICD-10-CM | POA: Diagnosis not present

## 2021-01-12 ENCOUNTER — Other Ambulatory Visit: Payer: Self-pay

## 2021-01-12 MED ORDER — METOPROLOL TARTRATE 25 MG PO TABS: 12.5000 mg | ORAL_TABLET | Freq: Two times a day (BID) | ORAL | 3 refills | Status: DC

## 2021-03-25 DIAGNOSIS — N390 Urinary tract infection, site not specified: Secondary | ICD-10-CM | POA: Diagnosis not present

## 2021-07-08 DIAGNOSIS — M791 Myalgia, unspecified site: Secondary | ICD-10-CM | POA: Diagnosis not present

## 2021-07-08 DIAGNOSIS — R7301 Impaired fasting glucose: Secondary | ICD-10-CM | POA: Diagnosis not present

## 2021-07-08 DIAGNOSIS — E785 Hyperlipidemia, unspecified: Secondary | ICD-10-CM | POA: Diagnosis not present

## 2021-07-08 DIAGNOSIS — F0390 Unspecified dementia without behavioral disturbance: Secondary | ICD-10-CM | POA: Diagnosis not present

## 2021-07-08 DIAGNOSIS — F419 Anxiety disorder, unspecified: Secondary | ICD-10-CM | POA: Diagnosis not present

## 2021-07-08 DIAGNOSIS — D692 Other nonthrombocytopenic purpura: Secondary | ICD-10-CM | POA: Diagnosis not present

## 2021-07-08 DIAGNOSIS — L309 Dermatitis, unspecified: Secondary | ICD-10-CM | POA: Diagnosis not present

## 2021-07-08 DIAGNOSIS — R946 Abnormal results of thyroid function studies: Secondary | ICD-10-CM | POA: Diagnosis not present

## 2021-07-08 DIAGNOSIS — E010 Iodine-deficiency related diffuse (endemic) goiter: Secondary | ICD-10-CM | POA: Diagnosis not present

## 2021-07-13 ENCOUNTER — Encounter (HOSPITAL_COMMUNITY): Payer: Self-pay | Admitting: Emergency Medicine

## 2021-07-13 ENCOUNTER — Emergency Department (HOSPITAL_COMMUNITY): Payer: Medicare PPO

## 2021-07-13 ENCOUNTER — Emergency Department (HOSPITAL_COMMUNITY)
Admission: EM | Admit: 2021-07-13 | Discharge: 2021-07-13 | Disposition: A | Discharge status: Home / Self Care | Payer: Medicare PPO | Attending: Emergency Medicine | Admitting: Emergency Medicine

## 2021-07-13 DIAGNOSIS — R001 Bradycardia, unspecified: Secondary | ICD-10-CM | POA: Diagnosis not present

## 2021-07-13 DIAGNOSIS — Z91040 Latex allergy status: Secondary | ICD-10-CM | POA: Insufficient documentation

## 2021-07-13 DIAGNOSIS — R4020 Unspecified coma: Secondary | ICD-10-CM | POA: Diagnosis not present

## 2021-07-13 DIAGNOSIS — R0902 Hypoxemia: Secondary | ICD-10-CM | POA: Diagnosis not present

## 2021-07-13 DIAGNOSIS — F0280 Dementia in other diseases classified elsewhere without behavioral disturbance: Secondary | ICD-10-CM

## 2021-07-13 DIAGNOSIS — R404 Transient alteration of awareness: Secondary | ICD-10-CM | POA: Diagnosis not present

## 2021-07-13 DIAGNOSIS — R4182 Altered mental status, unspecified: Secondary | ICD-10-CM

## 2021-07-13 DIAGNOSIS — R410 Disorientation, unspecified: Secondary | ICD-10-CM | POA: Diagnosis not present

## 2021-07-13 DIAGNOSIS — G301 Alzheimer's disease with late onset: Secondary | ICD-10-CM | POA: Diagnosis not present

## 2021-07-13 HISTORY — DX: Unspecified dementia, unspecified severity, without behavioral disturbance, psychotic disturbance, mood disturbance, and anxiety: F03.90

## 2021-07-13 LAB — CBC WITH DIFFERENTIAL/PLATELET
Abs Immature Granulocytes: 0.04 10*3/uL (ref 0.00–0.07)
Basophils Absolute: 0.1 10*3/uL (ref 0.0–0.1)
Basophils Relative: 1 %
Eosinophils Absolute: 0.2 10*3/uL (ref 0.0–0.5)
Eosinophils Relative: 2 %
HCT: 42.9 % (ref 36.0–46.0)
Hemoglobin: 13.8 g/dL (ref 12.0–15.0)
Immature Granulocytes: 1 %
Lymphocytes Relative: 20 %
Lymphs Abs: 1.6 10*3/uL (ref 0.7–4.0)
MCH: 29.6 pg (ref 26.0–34.0)
MCHC: 32.2 g/dL (ref 30.0–36.0)
MCV: 91.9 fL (ref 80.0–100.0)
Monocytes Absolute: 0.8 10*3/uL (ref 0.1–1.0)
Monocytes Relative: 10 %
Neutro Abs: 5.4 10*3/uL (ref 1.7–7.7)
Neutrophils Relative %: 66 %
Platelets: 241 10*3/uL (ref 150–400)
RBC: 4.67 MIL/uL (ref 3.87–5.11)
RDW: 12.8 % (ref 11.5–15.5)
WBC: 8.1 10*3/uL (ref 4.0–10.5)
nRBC: 0 % (ref 0.0–0.2)

## 2021-07-13 LAB — URINALYSIS, ROUTINE W REFLEX MICROSCOPIC
Bacteria, UA: NONE SEEN
Bilirubin Urine: NEGATIVE
Glucose, UA: NEGATIVE mg/dL
Hgb urine dipstick: NEGATIVE
Ketones, ur: NEGATIVE mg/dL
Nitrite: NEGATIVE
Protein, ur: NEGATIVE mg/dL
Specific Gravity, Urine: 1.006 (ref 1.005–1.030)
pH: 8 (ref 5.0–8.0)

## 2021-07-13 LAB — CBG MONITORING, ED: Glucose-Capillary: 93 mg/dL (ref 70–99)

## 2021-07-13 LAB — COMPREHENSIVE METABOLIC PANEL
ALT: 14 U/L (ref 0–44)
AST: 31 U/L (ref 15–41)
Albumin: 3.6 g/dL (ref 3.5–5.0)
Alkaline Phosphatase: 72 U/L (ref 38–126)
Anion gap: 6 (ref 5–15)
BUN: 14 mg/dL (ref 8–23)
CO2: 30 mmol/L (ref 22–32)
Calcium: 10.1 mg/dL (ref 8.9–10.3)
Chloride: 105 mmol/L (ref 98–111)
Creatinine, Ser: 0.79 mg/dL (ref 0.44–1.00)
GFR, Estimated: >60 mL/min (ref >60)
Glucose, Bld: 94 mg/dL (ref 70–99)
Potassium: 4.7 mmol/L (ref 3.5–5.1)
Sodium: 141 mmol/L (ref 135–145)
Total Bilirubin: 0.6 mg/dL (ref 0.3–1.2)
Total Protein: 7 g/dL (ref 6.5–8.1)

## 2021-07-13 MED ORDER — SODIUM CHLORIDE 0.9 % IV BOLUS
500.0000 mL | Freq: Once | INTRAVENOUS | Status: AC
  Administered 2021-07-13: 500 mL via INTRAVENOUS

## 2021-07-13 NOTE — ED Provider Notes (Signed)
[facility] DEPT Provider Note   CSN: [id number] Arrival date & time: 07/13/21  1202     History No chief complaint on file.   Autumn Wallace is a 87 y.o. female.  Autumn Wallace has a history of dementia and lives at [facility].  Over the past 3 days, she has had some episodes of altered mental status.  Her daughter was concerned about a potential UTI, and she plan to take her to urgent care.  However, when she arrived at the facility today, the patient appeared to be asleep.  Her daughter had difficulty awakening her, and at this point, EMS was called.  At this point, the patient is alert.  She thinks she is at home.  She is unsure why she is here.  However, she states that she feels fine and denies any pain.  The history is provided by a relative (daughter, Autumn Wallace). The history is limited by the condition of the patient.  Altered Mental Status Presenting symptoms: confusion   Severity:  Severe Most recent episode:  Today Episode history:  Multiple Duration:  3 days Timing:  Intermittent Progression:  Waxing and waning Chronicity:  Recurrent Context: dementia   Context: not head injury, not recent change in medication, not recent illness and not recent infection   Associated symptoms: no difficulty breathing, no fever and no vomiting       Past Medical History:  Diagnosis Date   Acute pancreatitis    Adenomatous colon polyp 12/06/2002   Allergy    seasonal   Dementia ([place])    Diverticulosis    Gallstones    Hiatal hernia    Hyperlipidemia    Pancreatitis     Patient Active Problem List   Diagnosis Date Noted   Late onset Alzheimer's disease without behavioral disturbance ([place]) 01/03/2020   Transient ischemic attack 01/03/2020   Numbness 01/03/2020   Strain of left trapezius muscle 11/17/2016   PSVT (paroxysmal supraventricular tachycardia) ([place]) 09/09/2016   Palpitations 08/24/2016   Dyslipidemia 08/28/2015   Screening for  cardiovascular condition 08/28/2015   Chest pain 08/28/2015   Knee pain, right 06/25/2015   Osteopenia 06/20/2012   Gallstone pancreatitis 08/12/2011   Compression fracture of lumbosacral spine ([place]) 06/23/2010   METATARSALGIA 06/15/2010   Tendonitis, Achilles, left 06/15/2010    Past Surgical History:  Procedure Laterality Date   CHOLECYSTECTOMY  2012   COLONOSCOPY     DILATION AND CURETTAGE OF UTERUS     TONSILLECTOMY AND ADENOIDECTOMY       OB History   No obstetric history on file.     Family History  Problem Relation Age of Onset   Breast cancer Sister    Colon cancer Neg Hx     Social History   Tobacco Use   Smoking status: Never   Smokeless tobacco: Never  Substance Use Topics   Alcohol use: No   Drug use: No    Home Medications Prior to Admission medications   Medication Sig Start Date End Date Taking? Authorizing Provider  escitalopram (LEXAPRO) 10 MG tablet Take 10 mg by mouth daily. 08/12/19   [provider]  galantamine (RAZADYNE ER) 16 MG 24 hr capsule Take 16 mg by mouth daily. 07/29/19   [provider]  memantine (NAMENDA) 10 MG tablet Take 10 mg by mouth 2 (two) times daily. 08/07/19   [provider]  metoprolol tartrate (LOPRESSOR) 25 MG tablet Take 0.5 tablets (12.5 mg total) by mouth 2 (  two) times daily. 01/12/21   Hilty, Nadean Corwin, MD    Allergies    Latex  Review of Systems   Review of Systems  Unable to perform ROS: Dementia  Constitutional:  Negative for fever.  Gastrointestinal:  Negative for vomiting.  Psychiatric/Behavioral:  Positive for confusion.    Physical Exam Updated Vital Signs BP 138/70   Pulse (!) 50   Temp 97.7 F (36.5 C) (Oral)   Resp 15   SpO2 98%   Physical Exam Vitals and nursing note reviewed.  Constitutional:      Appearance: She is well-developed.  HENT:     Head: Normocephalic and atraumatic.  Cardiovascular:     Rate and Rhythm: Normal rate and regular rhythm.     Heart  sounds: Normal heart sounds.  Pulmonary:     Effort: Pulmonary effort is normal. No tachypnea.     Breath sounds: Normal breath sounds.  Abdominal:     Palpations: Abdomen is soft.     Tenderness: There is no abdominal tenderness.  Musculoskeletal:     Right lower leg: No edema.     Left lower leg: No edema.  Skin:    General: Skin is warm and dry.  Neurological:     General: No focal deficit present.     Mental Status: She is alert. She is disoriented.  Psychiatric:        Mood and Affect: Mood normal.        Behavior: Behavior normal.    ED Results / Procedures / Treatments   Labs (all labs ordered are listed, but only abnormal results are displayed) Labs Reviewed  URINALYSIS, ROUTINE W REFLEX MICROSCOPIC - Abnormal; Notable for the following components:      Result Value   APPearance HAZY (*)    Leukocytes,Ua SMALL (*)    All other components within normal limits  URINE CULTURE  CBC WITH DIFFERENTIAL/PLATELET  COMPREHENSIVE METABOLIC PANEL  CBG MONITORING, ED    EKG None  Radiology CT Head Wo Contrast  Result Date: 07/13/2021 CLINICAL DATA:  Mental status change, unknown cause EXAM: CT HEAD WITHOUT CONTRAST TECHNIQUE: Contiguous axial images were obtained from the base of the skull through the vertex without intravenous contrast. COMPARISON:  12/22/2020 FINDINGS: Brain: There is no acute intracranial hemorrhage, mass effect, or edema. Gray-white differentiation is preserved. There is no extra-axial fluid collection. Prominence of the ventricles and sulci reflects generalized parenchymal volume loss. Patchy low-attenuation in the supratentorial white matter is nonspecific but probably reflects chronic microvascular ischemic changes. These findings are similar to the prior study. Vascular: No hyperdense vessel.There is minor atherosclerotic calcification at the skull base. Skull: Calvarium is unremarkable. Sinuses/Orbits: No acute finding. Other: None. IMPRESSION: No acute  intracranial abnormality. Electronically Signed   By: Macy Mis M.D.   On: 07/13/2021 13:40    Procedures Procedures   Medications Ordered in ED Medications  sodium chloride 0.9 % bolus 500 mL (500 mLs Intravenous New Bag/Given 07/13/21 1342)    ED Course  I have reviewed the triage vital signs and the nursing notes.  Pertinent labs & imaging results that were available during my care of the patient were reviewed by me and considered in my medical decision making (see chart for details).    MDM Rules/Calculators/A&P                           Autumn Wallace is 87 years old and suffers from dementia.  Over the past few days she has had some episodes of confusion.  She is well-appearing here with the exception of mild bradycardia.  This is likely secondary to her beta-blocker.  She was evaluated for obvious sources of her altered mental status.  Labs and imaging appeared within normal limits with the exception of mild leukocytosis in her urine.  Urine culture is pending.  She will be discharged back to her facility.  Careful return precautions were given. Final Clinical Impression(s) / ED Diagnoses Final diagnoses:  Altered mental status, unspecified altered mental status type  Late onset Alzheimer's disease without behavioral disturbance [facility])    Rx / DC Orders ED Discharge Orders     None        Arnaldo Natal, MD 07/13/21 1520

## 2021-07-13 NOTE — ED Triage Notes (Signed)
Daugther reports increasing confusion since Saturday. Normally alert to person and can sometimes recognize family. She was able to follow commands with EMS.   HX: Dementia, UTI  EMS vitals:   HR 51 104/66 BP 95% SPO2 on room air 141 CBG

## 2021-07-14 LAB — URINE CULTURE: Culture: NO GROWTH

## 2021-07-20 DIAGNOSIS — R079 Chest pain, unspecified: Secondary | ICD-10-CM | POA: Diagnosis not present

## 2021-07-20 DIAGNOSIS — R0902 Hypoxemia: Secondary | ICD-10-CM | POA: Diagnosis not present

## 2021-07-21 ENCOUNTER — Emergency Department (HOSPITAL_COMMUNITY)
Admission: EM | Admit: 2021-07-21 | Discharge: 2021-07-21 | Disposition: A | Discharge status: Home / Self Care | Payer: Medicare PPO | Attending: Emergency Medicine | Admitting: Emergency Medicine

## 2021-07-21 ENCOUNTER — Emergency Department (HOSPITAL_COMMUNITY): Payer: Medicare PPO

## 2021-07-21 ENCOUNTER — Encounter (HOSPITAL_COMMUNITY): Payer: Self-pay | Admitting: Emergency Medicine

## 2021-07-21 ENCOUNTER — Other Ambulatory Visit: Payer: Self-pay

## 2021-07-21 DIAGNOSIS — Z5321 Procedure and treatment not carried out due to patient leaving prior to being seen by health care provider: Secondary | ICD-10-CM | POA: Diagnosis not present

## 2021-07-21 DIAGNOSIS — R0789 Other chest pain: Secondary | ICD-10-CM | POA: Insufficient documentation

## 2021-07-21 DIAGNOSIS — F0390 Unspecified dementia without behavioral disturbance: Secondary | ICD-10-CM | POA: Insufficient documentation

## 2021-07-21 DIAGNOSIS — R079 Chest pain, unspecified: Secondary | ICD-10-CM | POA: Diagnosis not present

## 2021-07-21 LAB — CBC
HCT: 40.1 % (ref 36.0–46.0)
Hemoglobin: 13.1 g/dL (ref 12.0–15.0)
MCH: 29.6 pg (ref 26.0–34.0)
MCHC: 32.7 g/dL (ref 30.0–36.0)
MCV: 90.7 fL (ref 80.0–100.0)
Platelets: 258 10*3/uL (ref 150–400)
RBC: 4.42 MIL/uL (ref 3.87–5.11)
RDW: 12.5 % (ref 11.5–15.5)
WBC: 8 10*3/uL (ref 4.0–10.5)
nRBC: 0 % (ref 0.0–0.2)

## 2021-07-21 LAB — COMPREHENSIVE METABOLIC PANEL
ALT: 14 U/L (ref 0–44)
AST: 25 U/L (ref 15–41)
Albumin: 3.2 g/dL — ABNORMAL LOW (ref 3.5–5.0)
Alkaline Phosphatase: 82 U/L (ref 38–126)
Anion gap: 8 (ref 5–15)
BUN: 13 mg/dL (ref 8–23)
CO2: 27 mmol/L (ref 22–32)
Calcium: 9.9 mg/dL (ref 8.9–10.3)
Chloride: 103 mmol/L (ref 98–111)
Creatinine, Ser: 0.95 mg/dL (ref 0.44–1.00)
GFR, Estimated: 58 mL/min — ABNORMAL LOW (ref >60)
Glucose, Bld: 112 mg/dL — ABNORMAL HIGH (ref 70–99)
Potassium: 3.9 mmol/L (ref 3.5–5.1)
Sodium: 138 mmol/L (ref 135–145)
Total Bilirubin: 0.5 mg/dL (ref 0.3–1.2)
Total Protein: 6.8 g/dL (ref 6.5–8.1)

## 2021-07-21 LAB — TROPONIN I (HIGH SENSITIVITY): Troponin I (High Sensitivity): 8 ng/L (ref <18)

## 2021-07-21 NOTE — ED Provider Notes (Signed)
Emergency Medicine Provider Triage Evaluation Note  Autumn Wallace , a 87 y.o. female  was evaluated in triage.  Pt complains of chest pain.  She is endorsing soreness and her chest and her left ribs.  She denies shortness of breath.  No other history able to be ascertained due to dementia.  Review of Systems  Positive: Chest pain Negative: Shortness of breath  Physical Exam  BP 129/68 (BP Location: Left Arm)   Pulse (!) 58   Temp 98.1 F (36.7 C) (Oral)   Resp 13   SpO2 92%  Gen:   Awake, no distress   Resp:  Normal effort  MSK:   Moves extremities without difficulty  Other:  Reproducible tenderness to palpation over the left lateral ribs.  No overlying ecchymosis.  No crepitus or step-offs.  Medical Decision Making  Medically screening exam initiated at 12:47 AM.  Appropriate orders placed.  Autumn Wallace was informed that the remainder of the evaluation will be completed by another provider, this initial triage assessment does not replace that evaluation, and the importance of remaining in the ED until their evaluation is complete.  87 year old female brought in for chest pain.  She has dementia and history is limited.  She initially stated that it had resolved, but she continues to endorse soreness in her chest.  She had a recent ED visit for bradycardia.  Will order labs, chest x-ray, and EKG.  She will require further work-up and evaluation in the ED.   Joline Maxcy A, PA-C 07/21/21 0050    Ezequiel Essex, MD 07/21/21 [id number]

## 2021-07-21 NOTE — ED Notes (Signed)
PT LWBS with daughter

## 2021-07-21 NOTE — ED Triage Notes (Signed)
Per EMS, pt from independent living facility w/ her spouse. Pt has hx of dementia but initially called for chest wall pain, she is tender on left side below breast.  She states that she no longer has pain but upon palpation does appear to have some pain.    102 palpated 52 pulse 18 RR 94% RA CBG 119

## 2021-08-05 DIAGNOSIS — M6281 Muscle weakness (generalized): Secondary | ICD-10-CM | POA: Diagnosis not present

## 2021-08-05 DIAGNOSIS — R488 Other symbolic dysfunctions: Secondary | ICD-10-CM | POA: Diagnosis not present

## 2021-08-05 DIAGNOSIS — R41841 Cognitive communication deficit: Secondary | ICD-10-CM | POA: Diagnosis not present

## 2021-08-06 DIAGNOSIS — R488 Other symbolic dysfunctions: Secondary | ICD-10-CM | POA: Diagnosis not present

## 2021-08-06 DIAGNOSIS — M6281 Muscle weakness (generalized): Secondary | ICD-10-CM | POA: Diagnosis not present

## 2021-08-06 DIAGNOSIS — L821 Other seborrheic keratosis: Secondary | ICD-10-CM | POA: Diagnosis not present

## 2021-08-06 DIAGNOSIS — L720 Epidermal cyst: Secondary | ICD-10-CM | POA: Diagnosis not present

## 2021-08-06 DIAGNOSIS — R279 Unspecified lack of coordination: Secondary | ICD-10-CM | POA: Diagnosis not present

## 2021-08-06 DIAGNOSIS — R262 Difficulty in walking, not elsewhere classified: Secondary | ICD-10-CM | POA: Diagnosis not present

## 2021-08-11 DIAGNOSIS — M6281 Muscle weakness (generalized): Secondary | ICD-10-CM | POA: Diagnosis not present

## 2021-08-11 DIAGNOSIS — R279 Unspecified lack of coordination: Secondary | ICD-10-CM | POA: Diagnosis not present

## 2021-08-11 DIAGNOSIS — R488 Other symbolic dysfunctions: Secondary | ICD-10-CM | POA: Diagnosis not present

## 2021-08-11 DIAGNOSIS — R41841 Cognitive communication deficit: Secondary | ICD-10-CM | POA: Diagnosis not present

## 2021-08-11 DIAGNOSIS — R262 Difficulty in walking, not elsewhere classified: Secondary | ICD-10-CM | POA: Diagnosis not present

## 2021-08-12 DIAGNOSIS — R41841 Cognitive communication deficit: Secondary | ICD-10-CM | POA: Diagnosis not present

## 2021-08-12 DIAGNOSIS — R488 Other symbolic dysfunctions: Secondary | ICD-10-CM | POA: Diagnosis not present

## 2021-08-13 DIAGNOSIS — R488 Other symbolic dysfunctions: Secondary | ICD-10-CM | POA: Diagnosis not present

## 2021-08-13 DIAGNOSIS — R279 Unspecified lack of coordination: Secondary | ICD-10-CM | POA: Diagnosis not present

## 2021-08-13 DIAGNOSIS — R41841 Cognitive communication deficit: Secondary | ICD-10-CM | POA: Diagnosis not present

## 2021-08-13 DIAGNOSIS — M6281 Muscle weakness (generalized): Secondary | ICD-10-CM | POA: Diagnosis not present

## 2021-08-13 DIAGNOSIS — R262 Difficulty in walking, not elsewhere classified: Secondary | ICD-10-CM | POA: Diagnosis not present

## 2021-08-14 DIAGNOSIS — R41841 Cognitive communication deficit: Secondary | ICD-10-CM | POA: Diagnosis not present

## 2021-08-14 DIAGNOSIS — M6281 Muscle weakness (generalized): Secondary | ICD-10-CM | POA: Diagnosis not present

## 2021-08-14 DIAGNOSIS — R488 Other symbolic dysfunctions: Secondary | ICD-10-CM | POA: Diagnosis not present

## 2021-08-17 DIAGNOSIS — R488 Other symbolic dysfunctions: Secondary | ICD-10-CM | POA: Diagnosis not present

## 2021-08-17 DIAGNOSIS — R41841 Cognitive communication deficit: Secondary | ICD-10-CM | POA: Diagnosis not present

## 2021-08-17 DIAGNOSIS — M6281 Muscle weakness (generalized): Secondary | ICD-10-CM | POA: Diagnosis not present

## 2021-08-18 DIAGNOSIS — M6281 Muscle weakness (generalized): Secondary | ICD-10-CM | POA: Diagnosis not present

## 2021-08-18 DIAGNOSIS — R279 Unspecified lack of coordination: Secondary | ICD-10-CM | POA: Diagnosis not present

## 2021-08-18 DIAGNOSIS — R488 Other symbolic dysfunctions: Secondary | ICD-10-CM | POA: Diagnosis not present

## 2021-08-18 DIAGNOSIS — R262 Difficulty in walking, not elsewhere classified: Secondary | ICD-10-CM | POA: Diagnosis not present

## 2021-08-19 DIAGNOSIS — M6281 Muscle weakness (generalized): Secondary | ICD-10-CM | POA: Diagnosis not present

## 2021-08-19 DIAGNOSIS — R41841 Cognitive communication deficit: Secondary | ICD-10-CM | POA: Diagnosis not present

## 2021-08-19 DIAGNOSIS — R262 Difficulty in walking, not elsewhere classified: Secondary | ICD-10-CM | POA: Diagnosis not present

## 2021-08-19 DIAGNOSIS — R279 Unspecified lack of coordination: Secondary | ICD-10-CM | POA: Diagnosis not present

## 2021-08-19 DIAGNOSIS — R488 Other symbolic dysfunctions: Secondary | ICD-10-CM | POA: Diagnosis not present

## 2021-08-20 DIAGNOSIS — M6281 Muscle weakness (generalized): Secondary | ICD-10-CM | POA: Diagnosis not present

## 2021-08-20 DIAGNOSIS — R488 Other symbolic dysfunctions: Secondary | ICD-10-CM | POA: Diagnosis not present

## 2021-08-20 DIAGNOSIS — R41841 Cognitive communication deficit: Secondary | ICD-10-CM | POA: Diagnosis not present

## 2021-08-20 DIAGNOSIS — R262 Difficulty in walking, not elsewhere classified: Secondary | ICD-10-CM | POA: Diagnosis not present

## 2021-08-20 DIAGNOSIS — R279 Unspecified lack of coordination: Secondary | ICD-10-CM | POA: Diagnosis not present

## 2021-08-20 DIAGNOSIS — Z20828 Contact with and (suspected) exposure to other viral communicable diseases: Secondary | ICD-10-CM | POA: Diagnosis not present

## 2021-08-24 DIAGNOSIS — R488 Other symbolic dysfunctions: Secondary | ICD-10-CM | POA: Diagnosis not present

## 2021-08-24 DIAGNOSIS — R41841 Cognitive communication deficit: Secondary | ICD-10-CM | POA: Diagnosis not present

## 2021-08-24 DIAGNOSIS — M6281 Muscle weakness (generalized): Secondary | ICD-10-CM | POA: Diagnosis not present

## 2021-08-27 DIAGNOSIS — M6281 Muscle weakness (generalized): Secondary | ICD-10-CM | POA: Diagnosis not present

## 2021-08-27 DIAGNOSIS — R279 Unspecified lack of coordination: Secondary | ICD-10-CM | POA: Diagnosis not present

## 2021-08-27 DIAGNOSIS — R488 Other symbolic dysfunctions: Secondary | ICD-10-CM | POA: Diagnosis not present

## 2021-08-27 DIAGNOSIS — R262 Difficulty in walking, not elsewhere classified: Secondary | ICD-10-CM | POA: Diagnosis not present

## 2021-08-28 DIAGNOSIS — R488 Other symbolic dysfunctions: Secondary | ICD-10-CM | POA: Diagnosis not present

## 2021-08-28 DIAGNOSIS — R262 Difficulty in walking, not elsewhere classified: Secondary | ICD-10-CM | POA: Diagnosis not present

## 2021-08-28 DIAGNOSIS — M6281 Muscle weakness (generalized): Secondary | ICD-10-CM | POA: Diagnosis not present

## 2021-08-28 DIAGNOSIS — R279 Unspecified lack of coordination: Secondary | ICD-10-CM | POA: Diagnosis not present

## 2021-08-29 DIAGNOSIS — R41841 Cognitive communication deficit: Secondary | ICD-10-CM | POA: Diagnosis not present

## 2021-08-29 DIAGNOSIS — R488 Other symbolic dysfunctions: Secondary | ICD-10-CM | POA: Diagnosis not present

## 2021-08-31 DIAGNOSIS — R488 Other symbolic dysfunctions: Secondary | ICD-10-CM | POA: Diagnosis not present

## 2021-08-31 DIAGNOSIS — M6281 Muscle weakness (generalized): Secondary | ICD-10-CM | POA: Diagnosis not present

## 2021-09-01 DIAGNOSIS — J309 Allergic rhinitis, unspecified: Secondary | ICD-10-CM | POA: Diagnosis not present

## 2021-09-01 DIAGNOSIS — F419 Anxiety disorder, unspecified: Secondary | ICD-10-CM | POA: Diagnosis not present

## 2021-09-01 DIAGNOSIS — M6281 Muscle weakness (generalized): Secondary | ICD-10-CM | POA: Diagnosis not present

## 2021-09-01 DIAGNOSIS — R279 Unspecified lack of coordination: Secondary | ICD-10-CM | POA: Diagnosis not present

## 2021-09-01 DIAGNOSIS — R488 Other symbolic dysfunctions: Secondary | ICD-10-CM | POA: Diagnosis not present

## 2021-09-01 DIAGNOSIS — F0390 Unspecified dementia without behavioral disturbance: Secondary | ICD-10-CM | POA: Diagnosis not present

## 2021-09-01 DIAGNOSIS — R262 Difficulty in walking, not elsewhere classified: Secondary | ICD-10-CM | POA: Diagnosis not present

## 2021-09-01 DIAGNOSIS — J45909 Unspecified asthma, uncomplicated: Secondary | ICD-10-CM | POA: Diagnosis not present

## 2021-09-01 DIAGNOSIS — I129 Hypertensive chronic kidney disease with stage 1 through stage 4 chronic kidney disease, or unspecified chronic kidney disease: Secondary | ICD-10-CM | POA: Diagnosis not present

## 2021-09-01 DIAGNOSIS — E785 Hyperlipidemia, unspecified: Secondary | ICD-10-CM | POA: Diagnosis not present

## 2021-09-01 DIAGNOSIS — Z029 Encounter for administrative examinations, unspecified: Secondary | ICD-10-CM | POA: Diagnosis not present

## 2021-09-02 DIAGNOSIS — R488 Other symbolic dysfunctions: Secondary | ICD-10-CM | POA: Diagnosis not present

## 2021-09-02 DIAGNOSIS — R41841 Cognitive communication deficit: Secondary | ICD-10-CM | POA: Diagnosis not present

## 2021-09-03 DIAGNOSIS — R279 Unspecified lack of coordination: Secondary | ICD-10-CM | POA: Diagnosis not present

## 2021-09-03 DIAGNOSIS — R262 Difficulty in walking, not elsewhere classified: Secondary | ICD-10-CM | POA: Diagnosis not present

## 2021-09-03 DIAGNOSIS — M6281 Muscle weakness (generalized): Secondary | ICD-10-CM | POA: Diagnosis not present

## 2021-09-03 DIAGNOSIS — Z111 Encounter for screening for respiratory tuberculosis: Secondary | ICD-10-CM | POA: Diagnosis not present

## 2021-09-03 DIAGNOSIS — R488 Other symbolic dysfunctions: Secondary | ICD-10-CM | POA: Diagnosis not present

## 2021-09-04 DIAGNOSIS — R279 Unspecified lack of coordination: Secondary | ICD-10-CM | POA: Diagnosis not present

## 2021-09-04 DIAGNOSIS — M6281 Muscle weakness (generalized): Secondary | ICD-10-CM | POA: Diagnosis not present

## 2021-09-04 DIAGNOSIS — R262 Difficulty in walking, not elsewhere classified: Secondary | ICD-10-CM | POA: Diagnosis not present

## 2021-09-04 DIAGNOSIS — R41841 Cognitive communication deficit: Secondary | ICD-10-CM | POA: Diagnosis not present

## 2021-09-04 DIAGNOSIS — R488 Other symbolic dysfunctions: Secondary | ICD-10-CM | POA: Diagnosis not present

## 2021-09-10 DIAGNOSIS — Z8616 Personal history of COVID-19: Secondary | ICD-10-CM | POA: Diagnosis not present

## 2021-09-19 ENCOUNTER — Emergency Department (HOSPITAL_BASED_OUTPATIENT_CLINIC_OR_DEPARTMENT_OTHER): Payer: Medicare PPO

## 2021-09-19 ENCOUNTER — Encounter (HOSPITAL_BASED_OUTPATIENT_CLINIC_OR_DEPARTMENT_OTHER): Payer: Self-pay

## 2021-09-19 ENCOUNTER — Emergency Department (HOSPITAL_BASED_OUTPATIENT_CLINIC_OR_DEPARTMENT_OTHER)
Admission: EM | Admit: 2021-09-19 | Discharge: 2021-09-19 | Disposition: A | Discharge status: Home / Self Care | Payer: Medicare PPO | Attending: Emergency Medicine | Admitting: Emergency Medicine

## 2021-09-19 DIAGNOSIS — Z91040 Latex allergy status: Secondary | ICD-10-CM | POA: Insufficient documentation

## 2021-09-19 DIAGNOSIS — G309 Alzheimer's disease, unspecified: Secondary | ICD-10-CM | POA: Insufficient documentation

## 2021-09-19 DIAGNOSIS — Z79899 Other long term (current) drug therapy: Secondary | ICD-10-CM | POA: Diagnosis not present

## 2021-09-19 DIAGNOSIS — W19XXXA Unspecified fall, initial encounter: Secondary | ICD-10-CM | POA: Insufficient documentation

## 2021-09-19 DIAGNOSIS — S0591XA Unspecified injury of right eye and orbit, initial encounter: Secondary | ICD-10-CM | POA: Diagnosis present

## 2021-09-19 DIAGNOSIS — F0280 Dementia in other diseases classified elsewhere without behavioral disturbance: Secondary | ICD-10-CM | POA: Diagnosis not present

## 2021-09-19 DIAGNOSIS — J9811 Atelectasis: Secondary | ICD-10-CM | POA: Diagnosis not present

## 2021-09-19 DIAGNOSIS — Z043 Encounter for examination and observation following other accident: Secondary | ICD-10-CM | POA: Diagnosis not present

## 2021-09-19 DIAGNOSIS — S01111A Laceration without foreign body of right eyelid and periocular area, initial encounter: Secondary | ICD-10-CM | POA: Diagnosis not present

## 2021-09-19 DIAGNOSIS — S0990XA Unspecified injury of head, initial encounter: Secondary | ICD-10-CM | POA: Diagnosis not present

## 2021-09-19 DIAGNOSIS — Y92009 Unspecified place in unspecified non-institutional (private) residence as the place of occurrence of the external cause: Secondary | ICD-10-CM | POA: Diagnosis not present

## 2021-09-19 DIAGNOSIS — S0181XA Laceration without foreign body of other part of head, initial encounter: Secondary | ICD-10-CM

## 2021-09-19 DIAGNOSIS — Z23 Encounter for immunization: Secondary | ICD-10-CM | POA: Diagnosis not present

## 2021-09-19 DIAGNOSIS — T148XXA Other injury of unspecified body region, initial encounter: Secondary | ICD-10-CM | POA: Diagnosis not present

## 2021-09-19 DIAGNOSIS — R9431 Abnormal electrocardiogram [ECG] [EKG]: Secondary | ICD-10-CM | POA: Diagnosis not present

## 2021-09-19 LAB — BASIC METABOLIC PANEL
Anion gap: 7 (ref 5–15)
BUN: 19 mg/dL (ref 8–23)
CO2: 28 mmol/L (ref 22–32)
Calcium: 9.5 mg/dL (ref 8.9–10.3)
Chloride: 104 mmol/L (ref 98–111)
Creatinine, Ser: 1.06 mg/dL — ABNORMAL HIGH (ref 0.44–1.00)
GFR, Estimated: 51 mL/min — ABNORMAL LOW (ref >60)
Glucose, Bld: 125 mg/dL — ABNORMAL HIGH (ref 70–99)
Potassium: 4.3 mmol/L (ref 3.5–5.1)
Sodium: 139 mmol/L (ref 135–145)

## 2021-09-19 LAB — CBC
HCT: 38.1 % (ref 36.0–46.0)
Hemoglobin: 12.4 g/dL (ref 12.0–15.0)
MCH: 29.4 pg (ref 26.0–34.0)
MCHC: 32.5 g/dL (ref 30.0–36.0)
MCV: 90.3 fL (ref 80.0–100.0)
Platelets: 219 10*3/uL (ref 150–400)
RBC: 4.22 MIL/uL (ref 3.87–5.11)
RDW: 12.6 % (ref 11.5–15.5)
WBC: 9.6 10*3/uL (ref 4.0–10.5)
nRBC: 0 % (ref 0.0–0.2)

## 2021-09-19 MED ORDER — LIDOCAINE HCL (PF) 1 % IJ SOLN
10.0000 mL | Freq: Once | INTRAMUSCULAR | Status: AC
  Administered 2021-09-19: 10 mL

## 2021-09-19 MED ORDER — TETANUS-DIPHTH-ACELL PERTUSSIS 5-2.5-18.5 LF-MCG/0.5 IM SUSY
0.5000 mL | PREFILLED_SYRINGE | Freq: Once | INTRAMUSCULAR | Status: AC
  Administered 2021-09-19: 0.5 mL via INTRAMUSCULAR
  Filled 2021-09-19: qty 0.5

## 2021-09-19 NOTE — Discharge Instructions (Signed)
Suture removal in 5-7 days.  Apply antibiotic ointment to the wound daily

## 2021-09-19 NOTE — ED Provider Notes (Signed)
..  Laceration Repair  Date/Time: 09/19/2021 9:44 PM Performed by: Rhae Hammock, PA-C Authorized by: Rhae Hammock, PA-C   Consent:    Consent obtained:  Verbal   Consent given by:  Patient   Risks, benefits, and alternatives were discussed: yes     Risks discussed:  Infection, pain and need for additional repair Universal protocol:    Patient identity confirmed:  Verbally with patient Anesthesia:    Anesthesia method:  Local infiltration   Local anesthetic:  Lidocaine 2% WITH epi Laceration details:    Location:  Face   Face location:  R eyebrow   Length (cm):  2   Depth (mm):  1.5 Pre-procedure details:    Preparation:  Patient was prepped and draped in usual sterile fashion Exploration:    Hemostasis achieved with:  Epinephrine   Imaging outcome: foreign body not noted     Wound exploration: entire depth of wound visualized     Wound extent: no muscle damage noted, no nerve damage noted, no tendon damage noted and no vascular damage noted     Contaminated: no   Treatment:    Area cleansed with:  Saline and soap and water   Amount of cleaning:  Standard   Irrigation solution:  Sterile water   Irrigation volume:  25cc   Irrigation method:  Pressure wash   Debridement:  None   Undermining:  None Skin repair:    Repair method:  Sutures   Suture size:  5-0   Suture material:  Prolene   Suture technique:  Simple interrupted   Number of sutures:  3 Approximation:    Approximation:  Close Repair type:    Repair type:  Simple Post-procedure details:    Procedure completion:  Tolerated well, no immediate complications Comments:     3 Prolene simple interrupted sutures placed along right brow.  Patient tolerated well.  No visualized foreign bodies.    Darliss Ridgel 09/19/21 2150    Dorie Rank, MD 09/19/21 2238

## 2021-09-19 NOTE — ED Triage Notes (Addendum)
Pt is coming from home for an unwitnessed fall. Laceration to the right eyebrow. Pt denies any pain and is a poor historian. EMS placed c-collar because pt previously c/o shoulder pain. Per EMS patient was kicked out of nursing home today and started to live at home with her daughter. Daughter told her multiple times to stay in bed but pt has dementia. Not on blood thinners.

## 2021-09-19 NOTE — ED Provider Notes (Signed)
[facility] EMERGENCY DEPT Provider Note   CSN: [id number] Arrival date & time: 09/19/21  2052     History Chief Complaint  Patient presents with   Lytle Michaels    Autumn Wallace is a 87 y.o. female.   Fall   Patient presented to the emergency room for evaluation of a facial injury and fall.  Patient is living at home with family members.  She has history of dementia.  Patient previously had been residing in a nursing home.  According to the report the patient had an unwitnessed fall at home.  She stained a laceration above her right eye.  EMS placed her in a C-spine collar.  Patient denies any complaints right now.  She is unable to tell me what happened. Past Medical History:  Diagnosis Date   Acute pancreatitis    Adenomatous colon polyp 12/06/2002   Allergy    seasonal   Dementia ([place])    Diverticulosis    Gallstones    Hiatal hernia    Hyperlipidemia    Pancreatitis     Patient Active Problem List   Diagnosis Date Noted   Late onset Alzheimer's disease without behavioral disturbance ([place]) 01/03/2020   Transient ischemic attack 01/03/2020   Numbness 01/03/2020   Strain of left trapezius muscle 11/17/2016   PSVT (paroxysmal supraventricular tachycardia) ([place]) 09/09/2016   Palpitations 08/24/2016   Dyslipidemia 08/28/2015   Screening for cardiovascular condition 08/28/2015   Chest pain 08/28/2015   Knee pain, right 06/25/2015   Osteopenia 06/20/2012   Gallstone pancreatitis 08/12/2011   Compression fracture of lumbosacral spine ([place]) 06/23/2010   METATARSALGIA 06/15/2010   Tendonitis, Achilles, left 06/15/2010    Past Surgical History:  Procedure Laterality Date   CHOLECYSTECTOMY  2012   COLONOSCOPY     DILATION AND CURETTAGE OF UTERUS     TONSILLECTOMY AND ADENOIDECTOMY       OB History   No obstetric history on file.     Family History  Problem Relation Age of Onset   Breast cancer Sister    Colon cancer Neg Hx     Social History    Tobacco Use   Smoking status: Never   Smokeless tobacco: Never  Substance Use Topics   Alcohol use: No   Drug use: No    Home Medications Prior to Admission medications   Medication Sig Start Date End Date Taking? Authorizing Provider  cholecalciferol (VITAMIN D3) 25 MCG (1000 UNIT) tablet Take 1,000 Units by mouth daily.    [provider]  Cranberry 1000 MG CAPS Take 1,000 mg by mouth daily.    [provider]  divalproex (DEPAKOTE SPRINKLE) 125 MG capsule Take 62.5 mg by mouth daily. 07/10/21   [provider]  escitalopram (LEXAPRO) 10 MG tablet Take 10 mg by mouth daily. 08/12/19   [provider]  galantamine (RAZADYNE ER) 16 MG 24 hr capsule Take 16 mg by mouth daily. 07/29/19   [provider]  memantine (NAMENDA) 10 MG tablet Take 10 mg by mouth 2 (two) times daily. 08/07/19   [provider]  metoprolol tartrate (LOPRESSOR) 25 MG tablet Take 0.5 tablets (12.5 mg total) by mouth 2 (two) times daily. 01/12/21   Pixie Casino, MD  Multiple Vitamin (MULTIVITAMIN WITH MINERALS) TABS tablet Take 1 tablet by mouth daily.    [provider]  triamcinolone cream (KENALOG) 0.1 % Apply 1 application topically 2 (two) times daily. 07/08/21   [provider]  vitamin B-12 (CYANOCOBALAMIN)  1000 MCG tablet Take 1,000 mcg by mouth daily.    [provider]    Allergies    Latex  Review of Systems   Review of Systems  Physical Exam Updated Vital Signs BP (!) 162/75   Pulse (!) 51   Temp 98 F (36.7 C) (Oral)   Resp 17   Wt 60.9 kg   SpO2 99%   BMI 23.78 kg/m   Physical Exam  ED Results / Procedures / Treatments   Labs (all labs ordered are listed, but only abnormal results are displayed) Labs Reviewed  BASIC METABOLIC PANEL - Abnormal; Notable for the following components:      Result Value   Glucose, Bld 125 (*)    Creatinine, Ser 1.06 (*)    GFR, Estimated 51 (*)    All other components within  normal limits  CBC    EKG EKG Interpretation  Date/Time:  Saturday September 19 2021 21:26:36 EDT Ventricular Rate:  55 PR Interval:  166 QRS Duration: 86 QT Interval:  440 QTC Calculation: 421 R Axis:   32 Text Interpretation: Sinus rhythm Borderline low voltage, extremity leads Abnormal R-wave progression, early transition No significant change since last tracing Confirmed by Dorie Rank [phone]) on 09/19/2021 9:37:16 PM  Radiology CT Head Wo Contrast  Result Date: 09/19/2021 CLINICAL DATA:  Unwitnessed fall, laceration to right eyebrow EXAM: CT HEAD WITHOUT CONTRAST CT CERVICAL SPINE WITHOUT CONTRAST TECHNIQUE: Multidetector CT imaging of the head and cervical spine was performed following the standard protocol without intravenous contrast. Multiplanar CT image reconstructions of the cervical spine were also generated. COMPARISON:  CT head dated 07/13/2021 FINDINGS: CT HEAD FINDINGS Brain: No evidence of acute infarction, hemorrhage, hydrocephalus, extra-axial collection or mass lesion/mass effect. Mild cortical atrophy, likely age appropriate. Subcortical white matter and periventricular small vessel ischemic changes. Vascular: No hyperdense vessel or unexpected calcification. Skull: Normal. Negative for fracture or focal lesion. Sinuses/Orbits: The visualized paranasal sinuses are essentially clear. The mastoid air cells are unopacified. Other: None. CT CERVICAL SPINE FINDINGS Alignment: Normal cervical lordosis. Skull base and vertebrae: No acute fracture. No primary bone lesion or focal pathologic process. Soft tissues and spinal canal: No prevertebral fluid or swelling. No visible canal hematoma. Disc levels: Vertebral body heights and intervertebral disc spaces are maintained. Spinal canal is patent. Upper chest: Visualized lung apices are clear. Other: Multiple bilateral thyroid nodules measuring up to 13 mm on the right, likely multinodular goiter. These were previously evaluated on  thyroid ultrasound. Not clinically significant; no follow-up imaging recommended (ref: J Am Coll Radiol. 2015 Feb;12(2): 143-50). IMPRESSION: No evidence of acute intracranial abnormality. Atrophy with small vessel ischemic changes. Normal cervical spine CT. Electronically Signed   By: Julian Hy M.D.   On: 09/19/2021 22:13   CT Cervical Spine Wo Contrast  Result Date: 09/19/2021 CLINICAL DATA:  Unwitnessed fall, laceration to right eyebrow EXAM: CT HEAD WITHOUT CONTRAST CT CERVICAL SPINE WITHOUT CONTRAST TECHNIQUE: Multidetector CT imaging of the head and cervical spine was performed following the standard protocol without intravenous contrast. Multiplanar CT image reconstructions of the cervical spine were also generated. COMPARISON:  CT head dated 07/13/2021 FINDINGS: CT HEAD FINDINGS Brain: No evidence of acute infarction, hemorrhage, hydrocephalus, extra-axial collection or mass lesion/mass effect. Mild cortical atrophy, likely age appropriate. Subcortical white matter and periventricular small vessel ischemic changes. Vascular: No hyperdense vessel or unexpected calcification. Skull: Normal. Negative for fracture or focal lesion. Sinuses/Orbits: The visualized paranasal sinuses are essentially clear. The mastoid air cells  are unopacified. Other: None. CT CERVICAL SPINE FINDINGS Alignment: Normal cervical lordosis. Skull base and vertebrae: No acute fracture. No primary bone lesion or focal pathologic process. Soft tissues and spinal canal: No prevertebral fluid or swelling. No visible canal hematoma. Disc levels: Vertebral body heights and intervertebral disc spaces are maintained. Spinal canal is patent. Upper chest: Visualized lung apices are clear. Other: Multiple bilateral thyroid nodules measuring up to 13 mm on the right, likely multinodular goiter. These were previously evaluated on thyroid ultrasound. Not clinically significant; no follow-up imaging recommended (ref: J Am Coll Radiol. 2015  Feb;12(2): 143-50). IMPRESSION: No evidence of acute intracranial abnormality. Atrophy with small vessel ischemic changes. Normal cervical spine CT. Electronically Signed   By: Julian Hy M.D.   On: 09/19/2021 22:13   DG Chest Portable 1 View  Result Date: 09/19/2021 CLINICAL DATA:  Fall EXAM: PORTABLE CHEST 1 VIEW COMPARISON:  07/21/2021 FINDINGS: Mild left basilar atelectasis. Right lung is clear. No pleural effusion or pneumothorax. The heart is normal in size.  Thoracic aortic atherosclerosis. IMPRESSION: Mild left basilar atelectasis. Electronically Signed   By: Julian Hy M.D.   On: 09/19/2021 22:15    Procedures Procedures   Medications Ordered in ED Medications  Tdap (BOOSTRIX) injection 0.5 mL (has no administration in time range)  lidocaine (PF) (XYLOCAINE) 1 % injection 10 mL (10 mLs Infiltration Given by Other 09/19/21 2141)    ED Course  I have reviewed the triage vital signs and the nursing notes.  Pertinent labs & imaging results that were available during my care of the patient were reviewed by me and considered in my medical decision making (see chart for details).  Clinical Course as of 09/19/21 2237  Sat Sep 19, 2021  5176 CBC and metabolic panel unremarkable [JK]  2223 Head CT and C-spine CT without acute findings [JK]  2223 Chest x-ray without signs of pneumonia [JK]    Clinical Course User Index [JK] Dorie Rank, MD   MDM Rules/Calculators/A&P                           Patient presented to the ED for evaluation after fall.  Not witnessed but most likely mechanical fall.  Patient does have some dementia and memory issues.  ED work-up is reassuring.  No signs of anemia.  No signs of dehydration or metabolic abnormality.  CT of head and C-spine without acute findings.  Chest x-ray without pneumonia or pneumothorax.  Laceration was repaired by PA [place].  Findings were discussed with the patient and her daughter.  Patient is stable for  discharge Final Clinical Impression(s) / ED Diagnoses Final diagnoses:  Facial laceration, initial encounter  Fall, initial encounter    Rx / DC Orders ED Discharge Orders     None        Dorie Rank, MD 09/19/21 2238

## 2021-09-25 DIAGNOSIS — W19XXXA Unspecified fall, initial encounter: Secondary | ICD-10-CM | POA: Diagnosis not present

## 2021-09-25 DIAGNOSIS — F03C0 Unspecified dementia, severe, without behavioral disturbance, psychotic disturbance, mood disturbance, and anxiety: Secondary | ICD-10-CM | POA: Diagnosis not present

## 2021-09-25 DIAGNOSIS — M25511 Pain in right shoulder: Secondary | ICD-10-CM | POA: Diagnosis not present

## 2021-09-25 DIAGNOSIS — S01111A Laceration without foreign body of right eyelid and periocular area, initial encounter: Secondary | ICD-10-CM | POA: Diagnosis not present

## 2021-09-29 DIAGNOSIS — M25511 Pain in right shoulder: Secondary | ICD-10-CM | POA: Diagnosis not present

## 2021-09-29 DIAGNOSIS — S42024A Nondisplaced fracture of shaft of right clavicle, initial encounter for closed fracture: Secondary | ICD-10-CM | POA: Diagnosis not present

## 2021-09-29 DIAGNOSIS — W19XXXA Unspecified fall, initial encounter: Secondary | ICD-10-CM | POA: Diagnosis not present

## 2021-09-30 ENCOUNTER — Other Ambulatory Visit: Payer: Self-pay

## 2021-09-30 ENCOUNTER — Emergency Department (HOSPITAL_COMMUNITY)
Admission: EM | Admit: 2021-09-30 | Discharge: 2021-09-30 | Disposition: A | Discharge status: Home / Self Care | Payer: Medicare PPO | Attending: Emergency Medicine | Admitting: Emergency Medicine

## 2021-09-30 ENCOUNTER — Emergency Department (HOSPITAL_COMMUNITY): Payer: Medicare PPO

## 2021-09-30 ENCOUNTER — Encounter (HOSPITAL_COMMUNITY): Payer: Self-pay | Admitting: Emergency Medicine

## 2021-09-30 DIAGNOSIS — G301 Alzheimer's disease with late onset: Secondary | ICD-10-CM | POA: Diagnosis not present

## 2021-09-30 DIAGNOSIS — Z91040 Latex allergy status: Secondary | ICD-10-CM | POA: Diagnosis not present

## 2021-09-30 DIAGNOSIS — F0280 Dementia in other diseases classified elsewhere without behavioral disturbance: Secondary | ICD-10-CM | POA: Insufficient documentation

## 2021-09-30 DIAGNOSIS — R600 Localized edema: Secondary | ICD-10-CM | POA: Diagnosis not present

## 2021-09-30 DIAGNOSIS — F989 Unspecified behavioral and emotional disorders with onset usually occurring in childhood and adolescence: Secondary | ICD-10-CM | POA: Insufficient documentation

## 2021-09-30 DIAGNOSIS — R410 Disorientation, unspecified: Secondary | ICD-10-CM | POA: Insufficient documentation

## 2021-09-30 DIAGNOSIS — I959 Hypotension, unspecified: Secondary | ICD-10-CM | POA: Diagnosis not present

## 2021-09-30 DIAGNOSIS — R5383 Other fatigue: Secondary | ICD-10-CM | POA: Insufficient documentation

## 2021-09-30 DIAGNOSIS — R0902 Hypoxemia: Secondary | ICD-10-CM | POA: Diagnosis not present

## 2021-09-30 DIAGNOSIS — R4182 Altered mental status, unspecified: Secondary | ICD-10-CM | POA: Diagnosis not present

## 2021-09-30 DIAGNOSIS — Z20822 Contact with and (suspected) exposure to covid-19: Secondary | ICD-10-CM | POA: Diagnosis not present

## 2021-09-30 DIAGNOSIS — R404 Transient alteration of awareness: Secondary | ICD-10-CM | POA: Diagnosis not present

## 2021-09-30 LAB — COMPREHENSIVE METABOLIC PANEL
ALT: 14 U/L (ref 0–44)
AST: 21 U/L (ref 15–41)
Albumin: 3.5 g/dL (ref 3.5–5.0)
Alkaline Phosphatase: 100 U/L (ref 38–126)
Anion gap: 6 (ref 5–15)
BUN: 17 mg/dL (ref 8–23)
CO2: 32 mmol/L (ref 22–32)
Calcium: 9.9 mg/dL (ref 8.9–10.3)
Chloride: 101 mmol/L (ref 98–111)
Creatinine, Ser: 0.88 mg/dL (ref 0.44–1.00)
GFR, Estimated: >60 mL/min (ref >60)
Glucose, Bld: 100 mg/dL — ABNORMAL HIGH (ref 70–99)
Potassium: 4.6 mmol/L (ref 3.5–5.1)
Sodium: 139 mmol/L (ref 135–145)
Total Bilirubin: 0.5 mg/dL (ref 0.3–1.2)
Total Protein: 7.4 g/dL (ref 6.5–8.1)

## 2021-09-30 LAB — CBC WITH DIFFERENTIAL/PLATELET
Abs Immature Granulocytes: 0.03 10*3/uL (ref 0.00–0.07)
Basophils Absolute: 0 10*3/uL (ref 0.0–0.1)
Basophils Relative: 1 %
Eosinophils Absolute: 0.1 10*3/uL (ref 0.0–0.5)
Eosinophils Relative: 2 %
HCT: 39.4 % (ref 36.0–46.0)
Hemoglobin: 12.7 g/dL (ref 12.0–15.0)
Immature Granulocytes: 0 %
Lymphocytes Relative: 15 %
Lymphs Abs: 1.1 10*3/uL (ref 0.7–4.0)
MCH: 29.9 pg (ref 26.0–34.0)
MCHC: 32.2 g/dL (ref 30.0–36.0)
MCV: 92.7 fL (ref 80.0–100.0)
Monocytes Absolute: 0.7 10*3/uL (ref 0.1–1.0)
Monocytes Relative: 9 %
Neutro Abs: 5.6 10*3/uL (ref 1.7–7.7)
Neutrophils Relative %: 73 %
Platelets: 319 10*3/uL (ref 150–400)
RBC: 4.25 MIL/uL (ref 3.87–5.11)
RDW: 12.6 % (ref 11.5–15.5)
WBC: 7.6 10*3/uL (ref 4.0–10.5)
nRBC: 0 % (ref 0.0–0.2)

## 2021-09-30 LAB — URINALYSIS, ROUTINE W REFLEX MICROSCOPIC
Bilirubin Urine: NEGATIVE
Glucose, UA: NEGATIVE mg/dL
Hgb urine dipstick: NEGATIVE
Ketones, ur: NEGATIVE mg/dL
Leukocytes,Ua: NEGATIVE
Nitrite: NEGATIVE
Protein, ur: NEGATIVE mg/dL
Specific Gravity, Urine: 1.014 (ref 1.005–1.030)
pH: 7 (ref 5.0–8.0)

## 2021-09-30 LAB — VALPROIC ACID LEVEL: Valproic Acid Lvl: 13 ug/mL — ABNORMAL LOW (ref 50.0–100.0)

## 2021-09-30 LAB — RESP PANEL BY RT-PCR (FLU A&B, COVID) ARPGX2
Influenza A by PCR: NEGATIVE
Influenza B by PCR: NEGATIVE
SARS Coronavirus 2 by RT PCR: NEGATIVE

## 2021-09-30 LAB — CBG MONITORING, ED: Glucose-Capillary: 99 mg/dL (ref 70–99)

## 2021-09-30 LAB — LIPASE, BLOOD: Lipase: 35 U/L (ref 11–51)

## 2021-09-30 LAB — TROPONIN I (HIGH SENSITIVITY): Troponin I (High Sensitivity): 6 ng/L (ref <18)

## 2021-09-30 MED ORDER — LACTATED RINGERS IV BOLUS
500.0000 mL | Freq: Once | INTRAVENOUS | Status: AC
  Administered 2021-09-30: 500 mL via INTRAVENOUS

## 2021-09-30 NOTE — Discharge Instructions (Signed)
Ms. Sassi needs to be checked on several times at night to make sure she is sleeping at night.  Also need to make sure she is getting 40oz of fluids daily. All blood work and x-rays today are normal.  If she develops fever, vomiting, change in mental status and not improving return to the ER.

## 2021-09-30 NOTE — ED Provider Notes (Signed)
[facility] DEPT Provider Note   CSN: [id number] Arrival date & time: 09/30/21  1111     History Chief Complaint  Patient presents with   Altered Mental Status    Autumn Wallace is a 87 y.o. female.  Patient is an 87 year old female with a history of dementia with behavioral component, hyperlipidemia and prior pancreatitis from gallstones who is presenting today due to altered mental status.  Patient lives at [facility] and they reported yesterday they started to notice that she was not acting herself.  Today she was less responsive than her baseline they states she normally can speak for herself but today she has not been doing that.  There was no report of any nausea or vomiting.  She has not seem to have a cough or any respiratory issues.  No report of any fever.  However history was limited and patient does not give any verbal response.  The history is provided by the EMS personnel, the nursing home and medical records. The history is limited by the absence of a caregiver.  Altered Mental Status Presenting symptoms: behavior changes       Past Medical History:  Diagnosis Date   Acute pancreatitis    Adenomatous colon polyp 12/06/2002   Allergy    seasonal   Dementia ([place])    Diverticulosis    Gallstones    Hiatal hernia    Hyperlipidemia    Pancreatitis     Patient Active Problem List   Diagnosis Date Noted   Late onset Alzheimer's disease without behavioral disturbance ([place]) 01/03/2020   Transient ischemic attack 01/03/2020   Numbness 01/03/2020   Strain of left trapezius muscle 11/17/2016   PSVT (paroxysmal supraventricular tachycardia) ([place]) 09/09/2016   Palpitations 08/24/2016   Dyslipidemia 08/28/2015   Screening for cardiovascular condition 08/28/2015   Chest pain 08/28/2015   Knee pain, right 06/25/2015   Osteopenia 06/20/2012   Gallstone pancreatitis 08/12/2011   Compression fracture of lumbosacral spine ([place])  06/23/2010   METATARSALGIA 06/15/2010   Tendonitis, Achilles, left 06/15/2010    Past Surgical History:  Procedure Laterality Date   CHOLECYSTECTOMY  2012   COLONOSCOPY     DILATION AND CURETTAGE OF UTERUS     TONSILLECTOMY AND ADENOIDECTOMY       OB History   No obstetric history on file.     Family History  Problem Relation Age of Onset   Breast cancer Sister    Colon cancer Neg Hx     Social History   Tobacco Use   Smoking status: Never   Smokeless tobacco: Never  Substance Use Topics   Alcohol use: No   Drug use: No    Home Medications Prior to Admission medications   Medication Sig Start Date End Date Taking? Authorizing Provider  cholecalciferol (VITAMIN D3) 25 MCG (1000 UNIT) tablet Take 1,000 Units by mouth daily.    [provider]  Cranberry 1000 MG CAPS Take 1,000 mg by mouth daily.    [provider]  divalproex (DEPAKOTE SPRINKLE) 125 MG capsule Take 62.5 mg by mouth daily. 07/10/21   [provider]  escitalopram (LEXAPRO) 10 MG tablet Take 10 mg by mouth daily. 08/12/19   [provider]  galantamine (RAZADYNE ER) 16 MG 24 hr capsule Take 16 mg by mouth daily. 07/29/19   [provider]  memantine (NAMENDA) 10 MG tablet Take 10 mg by mouth 2 (two) times daily. 08/07/19   [provider]  metoprolol  tartrate (LOPRESSOR) 25 MG tablet Take 0.5 tablets (12.5 mg total) by mouth 2 (two) times daily. 01/12/21   Pixie Casino, MD  Multiple Vitamin (MULTIVITAMIN WITH MINERALS) TABS tablet Take 1 tablet by mouth daily.    [provider]  triamcinolone cream (KENALOG) 0.1 % Apply 1 application topically 2 (two) times daily. 07/08/21   [provider]  vitamin B-12 (CYANOCOBALAMIN) 1000 MCG tablet Take 1,000 mcg by mouth daily.    [provider]    Allergies    Latex  Review of Systems   Review of Systems  Unable to perform ROS: Dementia   Physical Exam Updated Vital Signs BP 122/65    Pulse 60   Temp 98.3 F (36.8 C) (Axillary)   Resp 16   Ht 5\' 3"  (1.6 m)   Wt 60.9 kg   SpO2 96%   BMI 23.78 kg/m   Physical Exam Vitals and nursing note reviewed.  Constitutional:      General: She is not in acute distress.    Appearance: Normal appearance. She is well-developed.     Comments: Pt holds her eyes closes but with intermittently spontaneously open them   HENT:     Head: Normocephalic and atraumatic.  Eyes:     Pupils: Pupils are equal, round, and reactive to light.  Cardiovascular:     Rate and Rhythm: Normal rate and regular rhythm.     Pulses: Normal pulses.     Heart sounds: Normal heart sounds. No murmur heard.   No friction rub.  Pulmonary:     Effort: Pulmonary effort is normal.     Breath sounds: Normal breath sounds. No wheezing or rales.  Abdominal:     General: Bowel sounds are normal. There is no distension.     Palpations: Abdomen is soft.     Tenderness: There is abdominal tenderness. There is no guarding or rebound.     Comments: Slightly winces when palpating in the suprapubic region  Musculoskeletal:        General: No tenderness. Normal range of motion.     Comments: Trace pitting edema at the ankles bilaterally  Skin:    General: Skin is warm and dry.     Capillary Refill: Capillary refill takes less than 2 seconds.     Findings: No rash.  Neurological:     Comments: Will not follow commands.  At times will answer yes and no questions.  No facial droop.  Moves arms and legs equally but will not follow commands.  Psychiatric:     Comments: Calm and cooperative    ED Results / Procedures / Treatments   Labs (all labs ordered are listed, but only abnormal results are displayed) Labs Reviewed  COMPREHENSIVE METABOLIC PANEL - Abnormal; Notable for the following components:      Result Value   Glucose, Bld 100 (*)    All other components within normal limits  VALPROIC ACID LEVEL - Abnormal; Notable for the following components:    Valproic Acid Lvl 13 (*)    All other components within normal limits  RESP PANEL BY RT-PCR (FLU A&B, COVID) ARPGX2  URINALYSIS, ROUTINE W REFLEX MICROSCOPIC  CBC WITH DIFFERENTIAL/PLATELET  LIPASE, BLOOD  CBG MONITORING, ED  TROPONIN I (HIGH SENSITIVITY)  TROPONIN I (HIGH SENSITIVITY)    EKG EKG Interpretation  Date/Time:  Wednesday September 30 2021 11:55:03 EDT Ventricular Rate:  65 PR Interval:  134 QRS Duration: 66 QT Interval:  404 QTC Calculation: 420 R  Axis:   11 Text Interpretation: Normal sinus rhythm Normal ECG No significant change since last tracing Artifact Confirmed by Blanchie Dessert [phone]) on 09/30/2021 12:22:51 PM  Radiology CT Head Wo Contrast  Result Date: 09/30/2021 CLINICAL DATA:  Altered mental status EXAM: CT HEAD WITHOUT CONTRAST TECHNIQUE: Contiguous axial images were obtained from the base of the skull through the vertex without intravenous contrast. COMPARISON:  CT head 09/19/2021 FINDINGS: Brain: There is no evidence of acute intracranial hemorrhage, extra-axial fluid collection, or acute infarct. There is unchanged global parenchymal volume loss with prominence of the ventricular system. The ventricles are stable in size. There is no mass lesion.  There is no midline shift. Vascular: No hyperdense vessel or unexpected calcification. Skull: Normal. Negative for fracture or focal lesion. Sinuses/Orbits: The imaged paranasal sinuses are clear. Bilateral lens implants are in place. The globes and orbits are otherwise unremarkable. Other: None. IMPRESSION: No acute intracranial pathology. Electronically Signed   By: Valetta Mole M.D.   On: 09/30/2021 13:03   DG Chest Port 1 View  Result Date: 09/30/2021 CLINICAL DATA:  Altered mental status, confusion EXAM: PORTABLE CHEST 1 VIEW COMPARISON:  Chest radiograph 09/19/2021 FINDINGS: The cardiomediastinal silhouette is stable. Patchy opacity in the lateral left base is similar to the prior study. There is no other  focal consolidation. There is no pulmonary edema. There is no significant pleural effusion. There is no pneumothorax. There is no acute osseous abnormality. IMPRESSION: Probable left basilar subsegmental atelectasis. Otherwise, no radiographic evidence of acute cardiopulmonary process. Electronically Signed   By: Valetta Mole M.D.   On: 09/30/2021 12:59    Procedures Procedures   Medications Ordered in ED Medications  lactated ringers bolus 500 mL (has no administration in time range)    ED Course  I have reviewed the triage vital signs and the nursing notes.  Pertinent labs & imaging results that were available during my care of the patient were reviewed by me and considered in my medical decision making (see chart for details).    MDM Rules/Calculators/A&P                           Elderly female presenting to the emergency room today due to altered mental status.  Patient is less responsive than her baseline.  She does have a history of advanced dementia but staff reports she normally can talk in do things for herself.  Since yesterday she just has not been acting herself.  No other current issues at this time.  Patient does have issues with behavioral issues and did have to move skilled facilities but there was no report of any new medication changes.  She is on Depakote sprinkles we will check to ensure that the she is not supratherapeutic.  We will check for infectious etiology as well as a head CT to ensure no evidence of new intracranial abnormality.  Patient does appear to have a healing wound over her right eyebrow will ensure there is no evidence of a bleed.  We will rule out any infectious etiology.  She is on no other mind altering medications.  Also symptoms could be behavioral.  1:19 PM Patient's daughter is now present and reports that she noticed yesterday patient was having a conversation but was not talking to her and was mumbling repeatedly.  And then this morning she was  very lethargic and she was worried.  She does report that patient's recently moved into a new  facility and her husband of 40 years has passed and there is a lot of changes that are going on.  She does not know if she is sleeping well at night.  All of patient's lab work, EKG and imaging is negative.  After 500 mL of fluid on repeat evaluation patient is now speaking in full complete sentences.  Her daughter reports she seems to be back to her baseline.  Did discuss with her daughter having someone check on her at night and make sure she is getting plenty of fluids.  MDM   Amount and/or Complexity of Data Reviewed Clinical lab tests: ordered and reviewed Tests in the radiology section of CPT: ordered and reviewed Tests in the medicine section of CPT: ordered and reviewed Independent visualization of images, tracings, or specimens: yes  Patient Progress Patient progress: improved    Final Clinical Impression(s) / ED Diagnoses Final diagnoses:  Disorientation    Rx / DC Orders ED Discharge Orders     None        Blanchie Dessert, MD 09/30/21 1325

## 2021-09-30 NOTE — ED Triage Notes (Signed)
Pt BIBA from [facility] Per EMS pt presents with c/o AMS. Per staff pt started becoming confused yesterday. Pt does have dementia baseline but normally A&O and able to follow commands. Staff reports no recent changes to any medications and pt has been eating and drinking as normal. Also reports this is similar to how she acts when she has a UTI.  A&O x2.    120/60 HR 70 98% room air RR 16 CBG 126

## 2021-10-14 DIAGNOSIS — S42024D Nondisplaced fracture of shaft of right clavicle, subsequent encounter for fracture with routine healing: Secondary | ICD-10-CM | POA: Diagnosis not present

## 2021-11-05 ENCOUNTER — Emergency Department (HOSPITAL_BASED_OUTPATIENT_CLINIC_OR_DEPARTMENT_OTHER): Payer: Medicare PPO

## 2021-11-05 ENCOUNTER — Other Ambulatory Visit: Payer: Self-pay

## 2021-11-05 ENCOUNTER — Emergency Department (HOSPITAL_BASED_OUTPATIENT_CLINIC_OR_DEPARTMENT_OTHER): Payer: Medicare PPO | Admitting: Radiology

## 2021-11-05 ENCOUNTER — Emergency Department (HOSPITAL_BASED_OUTPATIENT_CLINIC_OR_DEPARTMENT_OTHER)
Admission: EM | Admit: 2021-11-05 | Discharge: 2021-11-05 | Disposition: A | Discharge status: Home / Self Care | Payer: Medicare PPO | Attending: Emergency Medicine | Admitting: Emergency Medicine

## 2021-11-05 ENCOUNTER — Encounter (HOSPITAL_BASED_OUTPATIENT_CLINIC_OR_DEPARTMENT_OTHER): Payer: Self-pay | Admitting: Urology

## 2021-11-05 DIAGNOSIS — S0083XA Contusion of other part of head, initial encounter: Secondary | ICD-10-CM | POA: Insufficient documentation

## 2021-11-05 DIAGNOSIS — S0990XA Unspecified injury of head, initial encounter: Secondary | ICD-10-CM | POA: Diagnosis not present

## 2021-11-05 DIAGNOSIS — S42024A Nondisplaced fracture of shaft of right clavicle, initial encounter for closed fracture: Secondary | ICD-10-CM | POA: Insufficient documentation

## 2021-11-05 DIAGNOSIS — S42031A Displaced fracture of lateral end of right clavicle, initial encounter for closed fracture: Secondary | ICD-10-CM | POA: Diagnosis not present

## 2021-11-05 DIAGNOSIS — Z91040 Latex allergy status: Secondary | ICD-10-CM | POA: Insufficient documentation

## 2021-11-05 DIAGNOSIS — G309 Alzheimer's disease, unspecified: Secondary | ICD-10-CM | POA: Insufficient documentation

## 2021-11-05 DIAGNOSIS — W19XXXA Unspecified fall, initial encounter: Secondary | ICD-10-CM

## 2021-11-05 DIAGNOSIS — W01198A Fall on same level from slipping, tripping and stumbling with subsequent striking against other object, initial encounter: Secondary | ICD-10-CM | POA: Insufficient documentation

## 2021-11-05 DIAGNOSIS — S4991XA Unspecified injury of right shoulder and upper arm, initial encounter: Secondary | ICD-10-CM | POA: Diagnosis present

## 2021-11-05 DIAGNOSIS — F0280 Dementia in other diseases classified elsewhere without behavioral disturbance: Secondary | ICD-10-CM | POA: Insufficient documentation

## 2021-11-05 DIAGNOSIS — S51812A Laceration without foreign body of left forearm, initial encounter: Secondary | ICD-10-CM | POA: Insufficient documentation

## 2021-11-05 DIAGNOSIS — S0093XA Contusion of unspecified part of head, initial encounter: Secondary | ICD-10-CM | POA: Diagnosis not present

## 2021-11-05 DIAGNOSIS — S1093XA Contusion of unspecified part of neck, initial encounter: Secondary | ICD-10-CM | POA: Diagnosis not present

## 2021-11-05 DIAGNOSIS — Z79899 Other long term (current) drug therapy: Secondary | ICD-10-CM | POA: Insufficient documentation

## 2021-11-05 MED ORDER — TETANUS-DIPHTH-ACELL PERTUSSIS 5-2.5-18.5 LF-MCG/0.5 IM SUSY
0.5000 mL | PREFILLED_SYRINGE | Freq: Once | INTRAMUSCULAR | Status: DC
  Filled 2021-11-05: qty 0.5

## 2021-11-05 NOTE — Discharge Instructions (Addendum)
  Your CAT scan images from today of your head and cervical spine were negative.   Please wear  right shoulder sling during the daytime for the next 5 days.  May take the sling off at nighttime or when you are bathing/showering.  Please follow-up with orthopedic surgery regarding clavicle fracture.  You may continue wearing the sling beyond 5 days if you are having ongoing discomfort to the right collarbone.   Based on the events which brought you to the ER today, it is possible that you may have a concussion. A concussion occurs when there is a blow to the head or body, with enough force to shake the brain and disrupt how the brain functions. You may experience symptoms such as headaches, sensitivity to light/noise, dizziness, cognitive slowing, difficulty concentrating / remembering, trouble sleeping and drowsiness. These symptoms may last anywhere from hours/days to potentially weeks/months. While these symptoms are very frustrating and perhaps debilitating, it is important that you remember that they will improve over time. Everyone has a different rate of recovery; it is difficult to predict when your symptoms will resolve. In order to allow for your brain to heal after the injury, we recommend that you see your primary physician or a physician knowledgeable in concussion management. We also advise you to let your body and brain rest: avoid physical activities (sports, gym, and exercise) and reduce cognitive demands (reading, texting, TV watching, computer use, video games, etc). School attendance, after-school activities and work may need to be modified to avoid increasing symptoms. We recommend against driving until until all symptoms have resolved. You should take 650mg  of Acetaminophen (Tylenol) every 4 hours as needed for pain control; however, taking anti-inflammatory medication (Motrin/Advil/Ibuprofen) is not advised. Come back to the ER right away if you are having repeated episodes of vomiting,  severe/worsening headache/dizziness or any other symptom that alarms you. We recommended that someone stay with you for the next 24 hours to monitor for these worrisome symptoms.

## 2021-11-05 NOTE — ED Triage Notes (Signed)
Fall today, hit head, bruising noted No blood thinners Landed on right arm, h/o right clavicle fx 1 month ago.

## 2021-11-05 NOTE — ED Provider Notes (Signed)
[facility] Provider Note   CSN: [id number] Arrival date & time: 11/05/21  1750     History Chief Complaint  Patient presents with   Lytle Michaels    [facility] is a 87 y.o. female.  This is a 87 y.o. female with significant medical history as below, including dementia, hyperlipidemia who presents to the ED with complaint of fall.  Patient resides at assisted living facility, she was witnessed having a mechanical fall prior to arrival.  She had a similar fall proxy 1 month ago resulted in a broken clavicle.  Patient fell onto her right side, right shoulder, right side of her face.  No LOC, no thinners, no seizure activity following the incident, no incontinence, no nausea or vomiting, no prodromal symptoms.  Patient was ambulatory after this event.  Pain to the right side of her forehead, right shoulder, she also has a skin tear to her left extremity.  Unsure last tetanus shot.  Accompanied by family at bedside reports that she is at her approximate baseline currently   Level 5 caveat dementia  The history is provided by the patient and a relative. The history is limited by the condition of the patient. No language interpreter was used.  Fall Associated symptoms include headaches. Pertinent negatives include no chest pain, no abdominal pain and no shortness of breath.      Past Medical History:  Diagnosis Date   Acute pancreatitis    Adenomatous colon polyp 12/06/2002   Allergy    seasonal   Dementia ([employer])    Diverticulosis    Gallstones    Hiatal hernia    Hyperlipidemia    Pancreatitis     Patient Active Problem List   Diagnosis Date Noted   Late onset Alzheimer's disease without behavioral disturbance ([place]) 01/03/2020   Transient ischemic attack 01/03/2020   Numbness 01/03/2020   Strain of left trapezius muscle 11/17/2016   PSVT (paroxysmal supraventricular tachycardia) ([place]) 09/09/2016   Palpitations 08/24/2016   Dyslipidemia 08/28/2015    Screening for cardiovascular condition 08/28/2015   Chest pain 08/28/2015   Knee pain, right 06/25/2015   Osteopenia 06/20/2012   Gallstone pancreatitis 08/12/2011   Compression fracture of lumbosacral spine ([place]) 06/23/2010   METATARSALGIA 06/15/2010   Tendonitis, Achilles, left 06/15/2010    Past Surgical History:  Procedure Laterality Date   CHOLECYSTECTOMY  2012   COLONOSCOPY     DILATION AND CURETTAGE OF UTERUS     TONSILLECTOMY AND ADENOIDECTOMY       OB History   No obstetric history on file.     Family History  Problem Relation Age of Onset   Breast cancer Sister    Colon cancer Neg Hx     Social History   Tobacco Use   Smoking status: Never   Smokeless tobacco: Never  Substance Use Topics   Alcohol use: No   Drug use: No    Home Medications Prior to Admission medications   Medication Sig Start Date End Date Taking? Authorizing Provider  cholecalciferol (VITAMIN D3) 25 MCG (1000 UNIT) tablet Take 1,000 Units by mouth daily.    [provider]  Cranberry 1000 MG CAPS Take 1,000 mg by mouth daily.    [provider]  divalproex (DEPAKOTE SPRINKLE) 125 MG capsule Take 62.5 mg by mouth daily. 07/10/21   [provider]  escitalopram (LEXAPRO) 10 MG tablet Take 10 mg by mouth daily. 08/12/19   [provider]  galantamine (RAZADYNE ER) 16 MG  24 hr capsule Take 16 mg by mouth daily. 07/29/19   [provider]  memantine (NAMENDA) 10 MG tablet Take 10 mg by mouth 2 (two) times daily. 08/07/19   [provider]  metoprolol tartrate (LOPRESSOR) 25 MG tablet Take 0.5 tablets (12.5 mg total) by mouth 2 (two) times daily. 01/12/21   Pixie Casino, MD  Multiple Vitamin (MULTIVITAMIN WITH MINERALS) TABS tablet Take 1 tablet by mouth daily.    [provider]  triamcinolone cream (KENALOG) 0.1 % Apply 1 application topically 2 (two) times daily. 07/08/21   [provider]  vitamin B-12 (CYANOCOBALAMIN) 1000  MCG tablet Take 1,000 mcg by mouth daily.    [provider]    Allergies    Latex  Review of Systems   Review of Systems  Constitutional:  Negative for activity change and fever.  HENT:  Negative for facial swelling and trouble swallowing.   Eyes:  Negative for discharge and redness.  Respiratory:  Negative for cough and shortness of breath.   Cardiovascular:  Negative for chest pain and palpitations.  Gastrointestinal:  Negative for abdominal pain and nausea.  Genitourinary:  Negative for dysuria and flank pain.  Musculoskeletal:  Positive for arthralgias. Negative for back pain and gait problem.  Skin:  Positive for wound. Negative for pallor and rash.  Neurological:  Positive for headaches. Negative for syncope.   Physical Exam Updated Vital Signs BP (!) 123/111   Pulse (!) 59   Temp 97.9 F (36.6 C)   Resp 18   Ht 5\' 3"  (1.6 m)   Wt 60.9 kg   SpO2 97%   BMI 23.78 kg/m   Physical Exam Vitals and nursing note reviewed.  Constitutional:      General: She is not in acute distress.    Appearance: Normal appearance.  HENT:     Head: Normocephalic.      Right Ear: External ear normal.     Left Ear: External ear normal.     Nose: Nose normal.     Mouth/Throat:     Mouth: Mucous membranes are moist.  Eyes:     General: No scleral icterus.       Right eye: No discharge.        Left eye: No discharge.     Extraocular Movements: Extraocular movements intact.     Pupils: Pupils are equal, round, and reactive to light.  Cardiovascular:     Rate and Rhythm: Normal rate and regular rhythm.     Pulses: Normal pulses.     Heart sounds: Normal heart sounds.  Pulmonary:     Effort: Pulmonary effort is normal. No respiratory distress.     Breath sounds: Normal breath sounds.  Abdominal:     General: Abdomen is flat.     Tenderness: There is no abdominal tenderness.  Musculoskeletal:        General: Normal range of motion.       Arms:     Cervical back: Normal  range of motion.     Right lower leg: No edema.     Left lower leg: No edema.  Skin:    General: Skin is warm and dry.     Capillary Refill: Capillary refill takes less than 2 seconds.  Neurological:     Mental Status: She is alert. Mental status is at baseline.     GCS: GCS eye subscore is 4. GCS verbal subscore is 5. GCS motor subscore is 6.  Cranial Nerves: Cranial nerves 2-12 are intact.     Sensory: Sensation is intact.     Motor: Motor function is intact.     Coordination: Coordination is intact.  Psychiatric:        Mood and Affect: Mood normal.        Behavior: Behavior normal.    ED Results / Procedures / Treatments   Labs (all labs ordered are listed, but only abnormal results are displayed) Labs Reviewed - No data to display  EKG None  Radiology DG Shoulder Right  Result Date: 11/05/2021 CLINICAL DATA:  Fall. EXAM: RIGHT SHOULDER - 2+ VIEW COMPARISON:  Chest x-ray 09/30/2021. FINDINGS: There is a mid right clavicular fracture which appears new from prior examination. There is a distal right clavicular fracture which appears similar to the prior examination. There is no dislocation. Joint spaces are maintained. Soft tissues are within normal limits. IMPRESSION: 1. Mid right clavicular fracture appears new from 09/30/2021. Please correlate clinically for acuity. 2. Distal right clavicular fracture appears unchanged 09/30/2021. Electronically Signed   By: Ronney Asters M.D.   On: 11/05/2021 18:59   CT Head Wo Contrast  Result Date: 11/05/2021 CLINICAL DATA:  [place] today and hit her head. Bruising noted. Patient also injured the right arm. EXAM: CT HEAD WITHOUT CONTRAST CT CERVICAL SPINE WITHOUT CONTRAST TECHNIQUE: Multidetector CT imaging of the head and cervical spine was performed following the standard protocol without intravenous contrast. Multiplanar CT image reconstructions of the cervical spine were also generated. COMPARISON:  Head and cervical CTs, 09/19/2021.  FINDINGS: CT HEAD FINDINGS Brain: No evidence of acute infarction, hemorrhage, hydrocephalus, extra-axial collection or mass lesion/mass effect. Vascular: No hyperdense vessel or unexpected calcification. Skull: Normal. Negative for fracture or focal lesion. Sinuses/Orbits: Visualized globes and orbits are unremarkable. Visualized sinuses are clear. Other: None. CT CERVICAL SPINE FINDINGS Alignment: Normal. Skull base and vertebrae: No acute fracture. No primary bone lesion or focal pathologic process. Soft tissues and spinal canal: No prevertebral fluid or swelling. No visible canal hematoma. Disc levels: Disc spaces are relatively well maintained. No significant disc bulging or evidence of a disc herniation. No central stenosis or significant neural foraminal narrowing. Upper chest: Subacute fracture of the medial right clavicle. Other: None. IMPRESSION: HEAD CT 1. No acute intracranial abnormalities. CERVICAL CT 1. No cervical fracture or malalignment. 2. Subacute medial right clavicle fracture, with evidence healing. Electronically Signed   By: Lajean Manes M.D.   On: 11/05/2021 21:29   CT Cervical Spine Wo Contrast  Result Date: 11/05/2021 CLINICAL DATA:  [place] today and hit her head. Bruising noted. Patient also injured the right arm. EXAM: CT HEAD WITHOUT CONTRAST CT CERVICAL SPINE WITHOUT CONTRAST TECHNIQUE: Multidetector CT imaging of the head and cervical spine was performed following the standard protocol without intravenous contrast. Multiplanar CT image reconstructions of the cervical spine were also generated. COMPARISON:  Head and cervical CTs, 09/19/2021. FINDINGS: CT HEAD FINDINGS Brain: No evidence of acute infarction, hemorrhage, hydrocephalus, extra-axial collection or mass lesion/mass effect. Vascular: No hyperdense vessel or unexpected calcification. Skull: Normal. Negative for fracture or focal lesion. Sinuses/Orbits: Visualized globes and orbits are unremarkable. Visualized sinuses are  clear. Other: None. CT CERVICAL SPINE FINDINGS Alignment: Normal. Skull base and vertebrae: No acute fracture. No primary bone lesion or focal pathologic process. Soft tissues and spinal canal: No prevertebral fluid or swelling. No visible canal hematoma. Disc levels: Disc spaces are relatively well maintained. No significant disc bulging or evidence of a disc herniation. No  central stenosis or significant neural foraminal narrowing. Upper chest: Subacute fracture of the medial right clavicle. Other: None. IMPRESSION: HEAD CT 1. No acute intracranial abnormalities. CERVICAL CT 1. No cervical fracture or malalignment. 2. Subacute medial right clavicle fracture, with evidence healing. Electronically Signed   By: Lajean Manes M.D.   On: 11/05/2021 21:29    Procedures Procedures   Medications Ordered in ED Medications  Tdap (BOOSTRIX) injection 0.5 mL (0.5 mLs Intramuscular Not Given 11/05/21 2140)    ED Course  I have reviewed the triage vital signs and the nursing notes.  Pertinent labs & imaging results that were available during my care of the patient were reviewed by me and considered in my medical decision making (see chart for details).    MDM Rules/Calculators/A&P                           CC: Fall  This patient complains of above; this involves an extensive number of treatment options and is a complaint that carries with it a high risk of complications and morbidity. Vital signs were reviewed. Serious etiologies considered.  Record review:   Previous records obtained and reviewed   Additional history obtained from family  Work up as above, notable for:  imaging results that were available during my care of the patient were reviewed by me and considered in my medical decision making.   I ordered imaging studies which included CT head, CT cervical spine, shoulder right x-ray and I independently visualized and interpreted imaging which showed mid clavicle fracture nondisplaced on  the right. Also old distal right clavicle fracture.   Management: Tetanus updated, given sling  Reassessment:  Discussed with patient and daughter, reports that they have follow-up with [facility] from prior clavicle injury.  They will see them in the office and outpatient.  Pt ambulatory at time of discharge, no acute distress.   Discussed possibility of concussion and precautions regarding a possible concussion  The patient improved significantly and was discharged in stable condition. Detailed discussions were had with the patient/daughter regarding current findings, and need for close f/u with PCP or on call doctor. The patient/daughter has been instructed to return immediately if the symptoms worsen in any way for re-evaluation. Patient/daughter verbalized understanding and is in agreement with current care plan. All questions answered prior to discharge.           This chart was dictated using voice recognition software.  Despite best efforts to proofread,  errors can occur which can change the documentation meaning.  Final Clinical Impression(s) / ED Diagnoses Final diagnoses:  Fall, initial encounter  Closed nondisplaced fracture of shaft of right clavicle, initial encounter  Closed head injury, initial encounter  Contusion of forehead, initial encounter    Rx / DC Orders ED Discharge Orders     None        Jeanell Sparrow, DO 11/05/21 2329

## 2021-11-05 NOTE — ED Notes (Signed)
Pt's daughter verbalizes understanding of discharge instructions. Opportunity for questioning and answers were provided. Pt discharged from ED with daughter.

## 2021-11-11 DIAGNOSIS — S42024A Nondisplaced fracture of shaft of right clavicle, initial encounter for closed fracture: Secondary | ICD-10-CM | POA: Diagnosis not present

## 2021-11-12 DIAGNOSIS — R269 Unspecified abnormalities of gait and mobility: Secondary | ICD-10-CM | POA: Diagnosis not present

## 2021-11-12 DIAGNOSIS — M6281 Muscle weakness (generalized): Secondary | ICD-10-CM | POA: Diagnosis not present

## 2021-11-20 DIAGNOSIS — R269 Unspecified abnormalities of gait and mobility: Secondary | ICD-10-CM | POA: Diagnosis not present

## 2021-11-20 DIAGNOSIS — M6281 Muscle weakness (generalized): Secondary | ICD-10-CM | POA: Diagnosis not present

## 2021-12-02 DIAGNOSIS — S42024D Nondisplaced fracture of shaft of right clavicle, subsequent encounter for fracture with routine healing: Secondary | ICD-10-CM | POA: Diagnosis not present

## 2021-12-30 DIAGNOSIS — M810 Age-related osteoporosis without current pathological fracture: Secondary | ICD-10-CM | POA: Diagnosis not present

## 2021-12-30 DIAGNOSIS — S42024D Nondisplaced fracture of shaft of right clavicle, subsequent encounter for fracture with routine healing: Secondary | ICD-10-CM | POA: Diagnosis not present

## 2022-01-27 ENCOUNTER — Other Ambulatory Visit: Payer: Self-pay

## 2022-01-27 ENCOUNTER — Encounter: Payer: Self-pay | Admitting: Internal Medicine

## 2022-01-27 ENCOUNTER — Ambulatory Visit: Payer: Medicare PPO | Admitting: Internal Medicine

## 2022-01-27 VITALS — BP 128/76 | HR 66 | Ht 63.0 in | Wt 126.4 lb

## 2022-01-27 DIAGNOSIS — E785 Hyperlipidemia, unspecified: Secondary | ICD-10-CM | POA: Diagnosis not present

## 2022-01-27 DIAGNOSIS — I471 Supraventricular tachycardia: Secondary | ICD-10-CM

## 2022-01-27 NOTE — Progress Notes (Signed)
OFFICE NOTE  Chief Complaint:  No complaints  Primary Care Physician: Crist Infante, MD  HPI:  [facility]  Is a very pleasant 88 year old female who appears much younger than stated age. Past medical history significant for mild dyslipidemia on Lipitor and some orthopedic problems as well as osteoporosis. Otherwise she seems to be fairly healthy. She exercises 5 days a week doing both weights and cardio. She was referred for evaluation of an episode of mid substernal chest discomfort which occurred after doing some unusual exercises. There may been some palpitations associated with this but that quickly resolved and she's had no further complaints. She continues to be able to exercise without any restrictions. There is no known history of cardiovascular disease in the family. She's never had any cardiac events. She has no history of hypertension, diabetes or any significant risk factors for coronary disease. She is not a smoker. A screening at fourth sleepiness scale was negative for any sleep problems. EKG in the office today shows normal sinus rhythm at 60 without any ischemic changes.  08/24/2016  [facility] returns today for follow-up. When I last saw her it was felt that her chest pain was noncardiac. She did well over the past year however this past Sunday she had an episode of chest discomfort which radiated to her left arm. It was dull and lasted for about 5-10 minutes and awakened her from sleep. There were some associated palpitations. This went away fairly quickly. She's had no symptoms with exercise. She does not regularly get palpitations and has not had left arm pain symptoms in the past either. EKG today shows sinus bradycardia 59 with no ischemic changes  09/09/2016  [facility] returns for follow-up today. Her stress test demonstrated normal LV function and no ischemia. Her Holter monitor did pick up on several episodes of PSVT which was limited to less than 20 beats.  Subsequently after turning in her monitor she had a more sustained episode for which she was symptomatic. This was the same episode that she had originally had. Management may be complicated by bradycardia she's had some heart rates in the upper 50s and low 60s but she may tolerate low-dose beta blocker.  11/04/2016  [name] was seen today in follow-up. She seems to be tolerating low-dose beta blocker. She's had no recurrent SVT. She is very pleased with the medication. She is a self-reported person who is not interested in taking medications however she feels like this medicine is helpful and will take it. She also reported to me that she is not taking her Boniva or calcium and rarely takes her cholesterol medicine. I told her that this is important information for her primary care provider to be aware of.  06/03/2017  [facility] was seen today in follow-up. She is now reporting chest pain. She says she feels some heaviness in her chest. Her husband noted that recently she's had some decreased energy and seems to fatigue easier. She also has some left arm pain when lifting things. She's decreased her exercise due to this. She reports some recurrent palpitations and tachycardia. In September of last year she had a stress test as well as a monitor. The stress test was negative for ischemia and showed an EF of 75%. Her monitor demonstrated recurrent SVT. This is despite being on beta blocker with heart rate in the 50s. Is not clear that there is room to necessarily increase the dose of her beta blocker. She describes chest  pain is sharp in the left anterior chest. It can come at rest or with exertion.  07/27/2017  [facility] returns today for follow-up. She underwent a stress test which was negative for ischemia and showed normal LV function. Her monitor shows no evidence of SVT during this. Although she says she still gets occasional symptoms. In general she had mostly bradycardia and tends to  have a low blood pressure therefore I would not increase her beta blocker any further. I reassured her that these episodes are not life-threatening.  07/28/2018  [facility] was seen today in follow-up.  Overall doing well.  She denies any significant recurrent SVT although occasionally gets palpitations.  EKG shows sinus bradycardia today at 52 but otherwise no ischemic changes.  She denies any chest pain or shortness of breath.  She does do regular walking.  I reviewed labs from her primary care provider in September showed total cholesterol 180, HDL 49, LDL 119 and triglycerides 60.  Hemoglobin A1c is 5.6 and she has no history of diabetes or hypertension.  Her goal LDL is less than 130 on low-dose atorvastatin.  10/28/2020  [facility] returns today for follow-up with her daughter.  Overall she is well without any new complaints.  According to her daughter however her memory loss is accelerated somewhat.  There is been no evidence for recurrent SVT or palpitations.  She has had no complaints with that.  She denies any syncope, chest pain or worsening shortness of breath.  She has followed up fairly regularly with her PCP.  Blood pressure is excellent today.  She is only on low-dose metoprolol but occasionally forgets the medication.  Also her daughter notes is difficult for her to take some of the small half milligram metoprolol pills.  01/27/2022  [facility] returns today for follow-up.  She is accompanied by her daughter.  She has no new complaints of any SVT.  Heart rate is controlled and blood pressure is good on low-dose metoprolol.  She has had some progression of her dementia now is in memory care at [facility].  Other than that she seems to be doing well.  PMHx:  Past Medical History:  Diagnosis Date   Acute pancreatitis    Adenomatous colon polyp 12/06/2002   Allergy    seasonal   Dementia ([place])    Diverticulosis    Gallstones    Hiatal hernia    Hyperlipidemia     Pancreatitis     Past Surgical History:  Procedure Laterality Date   CHOLECYSTECTOMY  2012   COLONOSCOPY     DILATION AND CURETTAGE OF UTERUS     TONSILLECTOMY AND ADENOIDECTOMY      FAMHx:  Family History  Problem Relation Age of Onset   Breast cancer Sister    Colon cancer Neg Hx     SOCHx:   reports that she has never smoked. She has never used smokeless tobacco. She reports that she does not drink alcohol and does not use drugs.  ALLERGIES:  Allergies  Allergen Reactions   Latex Swelling    ROS: Pertinent items noted in HPI and remainder of comprehensive ROS otherwise negative.  HOME MEDS: Current Outpatient Medications  Medication Sig Dispense Refill   bifidobacterium infantis (ALIGN) capsule Take 1 capsule by mouth daily.     cholecalciferol (VITAMIN D3) 25 MCG (1000 UNIT) tablet Take 1,000 Units by mouth daily.     Cranberry 1000 MG CAPS Take 1,000 mg by mouth daily.  escitalopram (LEXAPRO) 10 MG tablet Take 10 mg by mouth daily.     galantamine (RAZADYNE ER) 16 MG 24 hr capsule Take 16 mg by mouth daily.     memantine (NAMENDA) 10 MG tablet Take 10 mg by mouth 2 (two) times daily.     metoprolol tartrate (LOPRESSOR) 25 MG tablet Take 0.5 tablets (12.5 mg total) by mouth 2 (two) times daily. 90 tablet 3   Multiple Vitamin (MULTIVITAMIN WITH MINERALS) TABS tablet Take 1 tablet by mouth daily.     Oyster Shell Calcium 500 MG TABS Take 1 tablet by mouth 2 (two) times daily.     PAIN RELIEF EXTRA STRENGTH 500 MG tablet Take by mouth as needed.     triamcinolone cream (KENALOG) 0.1 % Apply 1 application topically 2 (two) times daily.     vitamin B-12 (CYANOCOBALAMIN) 500 MCG tablet Take by mouth.     divalproex (DEPAKOTE SPRINKLE) 125 MG capsule Take 62.5 mg by mouth daily. (Patient not taking: Reported on 01/27/2022)     No current facility-administered medications for this visit.    LABS/IMAGING: No results found for this or any previous visit (from the past  48 hour(s)). No results found.  WEIGHTS: Wt Readings from Last 3 Encounters:  01/27/22 126 lb 6.4 oz (57.3 kg)  11/05/21 134 lb 4.2 oz (60.9 kg)  09/30/21 134 lb 4.2 oz (60.9 kg)    VITALS: BP 128/76    Pulse 66    Ht 5\' 3"  (1.6 m)    Wt 126 lb 6.4 oz (57.3 kg)    SpO2 97%    BMI 22.39 kg/m   EXAM: General appearance: alert and no distress Neck: no carotid bruit and no JVD Lungs: clear to auscultation bilaterally Heart: regular rate and rhythm Abdomen: soft, non-tender; bowel sounds normal; no masses,  no organomegaly Extremities: extremities normal, atraumatic, no cyanosis or edema Pulses: 2+ and symmetric Skin: Skin color, texture, turgor normal. No rashes or lesions Neurologic: Grossly normal Psych: Pleasant  EKG: Normal sinus rhythm at 66-personally reviewed  ASSESSMENT: History of chest pain and fatigue -  low risk myoview with normal LV function (06/2017) History of PSVT - no recent findings on the monitor Palpitations Dyslipidemia - at goal  Progressive dementia  PLAN: 1.   Mrs. Hassey seems to be doing well without complaints of any palpitations.  She is on low-dose metoprolol.  Cholesterol is well treated.  She has had unfortunately progressive dementia and now is in memory care.  No changes to her medicines today.  Follow-up with me annually or sooner as necessary.  Pixie Casino, MD, [facility], [place] Director of the Advanced Lipid Disorders &  Cardiovascular Risk Reduction Clinic Diplomate of the American Board of Clinical Lipidology Attending Cardiologist  Direct Dial: [phone]   Fax: [phone]  Website:  [email or web address] 01/27/2022, 4:43 PM

## 2022-01-27 NOTE — Patient Instructions (Signed)
Medication Instructions:  Your physician recommends that you continue on your current medications as directed. Please refer to the Current Medication list given to you today.  *If you need a refill on your cardiac medications before your next appointment, please call your pharmacy*  Follow-Up: At [facility], you and your health needs are our priority.  As part of our continuing mission to provide you with exceptional heart care, we have created designated Provider Care Teams.  These Care Teams include your primary Cardiologist (physician) and Advanced Practice Providers (APPs -  Physician Assistants and Nurse Practitioners) who all work together to provide you with the care you need, when you need it.  We recommend signing up for the patient portal called "MyChart".  Sign up information is provided on this After Visit Summary.  MyChart is used to connect with patients for Virtual Visits (Telemedicine).  Patients are able to view lab/test results, encounter notes, upcoming appointments, etc.  Non-urgent messages can be sent to your provider as well.   To learn more about what you can do with MyChart, go to [email or web address].    Your next appointment:    1 year visit with Dr. Debara Pickett

## 2022-02-11 DIAGNOSIS — M810 Age-related osteoporosis without current pathological fracture: Secondary | ICD-10-CM | POA: Diagnosis not present

## 2022-02-11 DIAGNOSIS — E785 Hyperlipidemia, unspecified: Secondary | ICD-10-CM | POA: Diagnosis not present

## 2022-02-11 DIAGNOSIS — R946 Abnormal results of thyroid function studies: Secondary | ICD-10-CM | POA: Diagnosis not present

## 2022-02-11 DIAGNOSIS — R7301 Impaired fasting glucose: Secondary | ICD-10-CM | POA: Diagnosis not present

## 2022-02-11 DIAGNOSIS — R5383 Other fatigue: Secondary | ICD-10-CM | POA: Diagnosis not present

## 2022-02-11 DIAGNOSIS — F419 Anxiety disorder, unspecified: Secondary | ICD-10-CM | POA: Diagnosis not present

## 2022-02-18 DIAGNOSIS — Z0000 Encounter for general adult medical examination without abnormal findings: Secondary | ICD-10-CM | POA: Diagnosis not present

## 2022-02-18 DIAGNOSIS — F03C0 Unspecified dementia, severe, without behavioral disturbance, psychotic disturbance, mood disturbance, and anxiety: Secondary | ICD-10-CM | POA: Diagnosis not present

## 2022-02-18 DIAGNOSIS — R2681 Unsteadiness on feet: Secondary | ICD-10-CM | POA: Diagnosis not present

## 2022-02-18 DIAGNOSIS — D692 Other nonthrombocytopenic purpura: Secondary | ICD-10-CM | POA: Diagnosis not present

## 2022-02-18 DIAGNOSIS — M810 Age-related osteoporosis without current pathological fracture: Secondary | ICD-10-CM | POA: Diagnosis not present

## 2022-02-18 DIAGNOSIS — R82998 Other abnormal findings in urine: Secondary | ICD-10-CM | POA: Diagnosis not present

## 2022-02-18 DIAGNOSIS — R7301 Impaired fasting glucose: Secondary | ICD-10-CM | POA: Diagnosis not present

## 2022-02-18 DIAGNOSIS — K5900 Constipation, unspecified: Secondary | ICD-10-CM | POA: Diagnosis not present

## 2022-02-18 DIAGNOSIS — I129 Hypertensive chronic kidney disease with stage 1 through stage 4 chronic kidney disease, or unspecified chronic kidney disease: Secondary | ICD-10-CM | POA: Diagnosis not present

## 2022-02-19 DIAGNOSIS — H6123 Impacted cerumen, bilateral: Secondary | ICD-10-CM | POA: Diagnosis not present

## 2022-03-05 ENCOUNTER — Emergency Department (HOSPITAL_BASED_OUTPATIENT_CLINIC_OR_DEPARTMENT_OTHER)
Admission: EM | Admit: 2022-03-05 | Discharge: 2022-03-05 | Disposition: A | Discharge status: Home / Self Care | Payer: Medicare PPO | Attending: Emergency Medicine | Admitting: Emergency Medicine

## 2022-03-05 ENCOUNTER — Emergency Department (HOSPITAL_BASED_OUTPATIENT_CLINIC_OR_DEPARTMENT_OTHER): Payer: Medicare PPO

## 2022-03-05 ENCOUNTER — Emergency Department (HOSPITAL_BASED_OUTPATIENT_CLINIC_OR_DEPARTMENT_OTHER): Payer: Medicare PPO | Admitting: Radiology

## 2022-03-05 ENCOUNTER — Encounter (HOSPITAL_BASED_OUTPATIENT_CLINIC_OR_DEPARTMENT_OTHER): Payer: Self-pay | Admitting: Emergency Medicine

## 2022-03-05 ENCOUNTER — Other Ambulatory Visit: Payer: Self-pay

## 2022-03-05 DIAGNOSIS — S0990XA Unspecified injury of head, initial encounter: Secondary | ICD-10-CM | POA: Diagnosis not present

## 2022-03-05 DIAGNOSIS — S4992XA Unspecified injury of left shoulder and upper arm, initial encounter: Secondary | ICD-10-CM | POA: Diagnosis present

## 2022-03-05 DIAGNOSIS — S0083XA Contusion of other part of head, initial encounter: Secondary | ICD-10-CM | POA: Insufficient documentation

## 2022-03-05 DIAGNOSIS — R4781 Slurred speech: Secondary | ICD-10-CM | POA: Insufficient documentation

## 2022-03-05 DIAGNOSIS — S299XXA Unspecified injury of thorax, initial encounter: Secondary | ICD-10-CM | POA: Diagnosis not present

## 2022-03-05 DIAGNOSIS — S42035A Nondisplaced fracture of lateral end of left clavicle, initial encounter for closed fracture: Secondary | ICD-10-CM | POA: Diagnosis not present

## 2022-03-05 DIAGNOSIS — F0390 Unspecified dementia without behavioral disturbance: Secondary | ICD-10-CM | POA: Diagnosis not present

## 2022-03-05 DIAGNOSIS — Z91040 Latex allergy status: Secondary | ICD-10-CM | POA: Insufficient documentation

## 2022-03-05 DIAGNOSIS — Y92129 Unspecified place in nursing home as the place of occurrence of the external cause: Secondary | ICD-10-CM | POA: Diagnosis not present

## 2022-03-05 DIAGNOSIS — S42032A Displaced fracture of lateral end of left clavicle, initial encounter for closed fracture: Secondary | ICD-10-CM | POA: Diagnosis not present

## 2022-03-05 DIAGNOSIS — W19XXXA Unspecified fall, initial encounter: Secondary | ICD-10-CM

## 2022-03-05 DIAGNOSIS — S20212A Contusion of left front wall of thorax, initial encounter: Secondary | ICD-10-CM | POA: Diagnosis not present

## 2022-03-05 DIAGNOSIS — W01198A Fall on same level from slipping, tripping and stumbling with subsequent striking against other object, initial encounter: Secondary | ICD-10-CM | POA: Diagnosis not present

## 2022-03-05 LAB — URINALYSIS, ROUTINE W REFLEX MICROSCOPIC
Bilirubin Urine: NEGATIVE
Glucose, UA: NEGATIVE mg/dL
Hgb urine dipstick: NEGATIVE
Ketones, ur: NEGATIVE mg/dL
Leukocytes,Ua: NEGATIVE
Nitrite: NEGATIVE
Protein, ur: NEGATIVE mg/dL
Specific Gravity, Urine: 1.007 (ref 1.005–1.030)
pH: 6.5 (ref 5.0–8.0)

## 2022-03-05 MED ORDER — ACETAMINOPHEN 325 MG PO TABS
650.0000 mg | ORAL_TABLET | Freq: Four times a day (QID) | ORAL | 1 refills | Status: AC | PRN
Start: 2022-03-05 — End: ?

## 2022-03-05 NOTE — ED Notes (Signed)
Performed In&out catheter using sterile technique. EMT Alyssa assisted at bed side. ?

## 2022-03-05 NOTE — Discharge Instructions (Addendum)
There is a fracture or broken bone in the left clavicle. Autumn Wallace should wear an arm sling (blue shoulder sling) at all times and follow-up with an orthopedic doctor.  She can take Tylenol as needed for pain 650 mg every 6 hours.  A prescription was provided. ? ?The CT scan of her brain did not show signs of brain bleed.  Her urine didn't show signs of infection. ?

## 2022-03-05 NOTE — ED Provider Notes (Signed)
?[facility] ?Provider Note ? ? ?CSN: [id number] ?Arrival date & time: 03/05/22  1256 ? ?  ? ?History ? ?Chief Complaint  ?Patient presents with  ? Fall  ? ? ?Autumn Wallace is a 88 y.o. female presenting in company of her daughter from a nursing facility with concern for a fall 4 days ago and confusion.  The patient had a reported mechanical fall 4 days ago and struck her left head and her left shoulder on the ground.  She has some bruising around the left shoulder.  Her daughter reports the patient was behaving okay but today seemed to have some slurred speech and more confused than normal, although she does have significant dementia at baseline.  The patient is not on blood thinners per her daughter's report.  The patient does have a history of recurring clavicle fracture of the left side and does have some bruising of the left shoulder at this time, also with chronic limited range of motion of the left shoulder from her old injuries. ? ?HPI ? ?  ? ?Home Medications ?Prior to Admission medications   ?Medication Sig Start Date End Date Taking? Authorizing Provider  ?acetaminophen (TYLENOL) 325 MG tablet Take 2 tablets (650 mg total) by mouth every 6 (six) hours as needed for up to 30 doses for moderate pain or mild pain. 03/05/22  Yes Wyvonnia Dusky, MD  ?bifidobacterium infantis (ALIGN) capsule Take 1 capsule by mouth daily. 01/20/22   [provider]  ?cholecalciferol (VITAMIN D3) 25 MCG (1000 UNIT) tablet Take 1,000 Units by mouth daily.    [provider]  ?Cranberry 1000 MG CAPS Take 1,000 mg by mouth daily.    [provider]  ?divalproex (DEPAKOTE SPRINKLE) 125 MG capsule Take 62.5 mg by mouth daily. ?Patient not taking: Reported on 01/27/2022 07/10/21   [provider]  ?escitalopram (LEXAPRO) 10 MG tablet Take 10 mg by mouth daily. 08/12/19   [provider]  ?galantamine (RAZADYNE ER) 16 MG 24 hr capsule Take 16 mg by mouth daily.  07/29/19   [provider]  ?memantine (NAMENDA) 10 MG tablet Take 10 mg by mouth 2 (two) times daily. 08/07/19   [provider]  ?metoprolol tartrate (LOPRESSOR) 25 MG tablet Take 0.5 tablets (12.5 mg total) by mouth 2 (two) times daily. 01/12/21   Pixie Casino, MD  ?Multiple Vitamin (MULTIVITAMIN WITH MINERALS) TABS tablet Take 1 tablet by mouth daily.    [provider]  ?[place] Calcium 500 MG TABS Take 1 tablet by mouth 2 (two) times daily. 01/21/22   [provider]  ?PAIN RELIEF EXTRA STRENGTH 500 MG tablet Take by mouth as needed. 01/04/22   [provider]  ?triamcinolone cream (KENALOG) 0.1 % Apply 1 application topically 2 (two) times daily. 07/08/21   [provider]  ?vitamin B-12 (CYANOCOBALAMIN) 500 MCG tablet Take by mouth. 12/24/21   [provider]  ?   ? ?Allergies    ?Latex   ? ?Review of Systems   ?Review of Systems ? ?Physical Exam ?Updated Vital Signs ?BP (!) 147/69 (BP Location: Right Arm)   Pulse 63   Temp 98.4 ?F (36.9 ?C)   Resp 16   SpO2 97%  ?Physical Exam ?Constitutional:   ?   General: She is not in acute distress. ?HENT:  ?   Head: Normocephalic.  ?   Comments: Contusion to the left forehead ?Eyes:  ?   Conjunctiva/sclera: Conjunctivae normal.  ?  Pupils: Pupils are equal, round, and reactive to light.  ?Cardiovascular:  ?   Rate and Rhythm: Normal rate and regular rhythm.  ?Pulmonary:  ?   Effort: Pulmonary effort is normal. No respiratory distress.  ?Musculoskeletal:  ?   Comments: Limited range of motion of left shoulder due to pain, bruising of the left clavicle and left shoulder and left anterior chest  ?Skin: ?   General: Skin is warm and dry.  ?Neurological:  ?   General: No focal deficit present.  ?   Mental Status: She is alert. Mental status is at baseline.  ? ? ?ED Results / Procedures / Treatments   ?Labs ?(all labs ordered are listed, but only abnormal results are displayed) ?Labs Reviewed  ?URINALYSIS,  ROUTINE W REFLEX MICROSCOPIC - Abnormal; Notable for the following components:  ?    Result Value  ? Color, Urine COLORLESS (*)   ? All other components within normal limits  ? ? ?EKG ?None ? ?Radiology ?CT Head Wo Contrast ? ?Result Date: 03/05/2022 ?CLINICAL DATA:  Head trauma, moderate to severe. History of dementia with fall 4 days ago. EXAM: CT HEAD WITHOUT CONTRAST TECHNIQUE: Contiguous axial images were obtained from the base of the skull through the vertex without intravenous contrast. RADIATION DOSE REDUCTION: This exam was performed according to the departmental dose-optimization program which includes automated exposure control, adjustment of the mA and/or kV according to patient size and/or use of iterative reconstruction technique. COMPARISON:  CT head 11/05/2021 FINDINGS: Brain: There is no evidence of acute intracranial hemorrhage, mass lesion, brain edema or extra-axial fluid collection. Stable atrophy with prominence of the ventricles and subarachnoid spaces. Mild chronic small vessel ischemic changes in the basal ganglia and periventricular white matter. There is no CT evidence of acute cortical infarction. Vascular:  No hyperdense vessel identified. Skull: Negative for fracture or focal lesion. Sinuses/Orbits: The visualized paranasal sinuses and mastoid air cells are clear. No orbital abnormalities are seen. Other: None. IMPRESSION: 1. Stable head CT without acute intracranial findings. 2. Mild atrophy and chronic small vessel ischemic changes. Electronically Signed   By: Richardean Sale M.D.   On: 03/05/2022 14:23  ? ?DG Chest Portable 1 View ? ?Result Date: 03/05/2022 ?CLINICAL DATA:  Trauma.  Left clavicle bruising. EXAM: PORTABLE CHEST 1 VIEW COMPARISON:  11/05/2021 FINDINGS: Cardiomediastinal contours are stable. No pleural effusion or edema. Scar versus subsegmental atelectasis noted in the left lung base. Healing fracture deformity is noted involving the mid shaft of the right clavicle.  Acute fracture is noted involving the distal aspect of the left clavicle. IMPRESSION: 1. Scar versus subsegmental atelectasis in the left lung base. 2. Acute fracture of the distal aspect of the left clavicle. Electronically Signed   By: Kerby Moors M.D.   On: 03/05/2022 14:11  ? ?DG Shoulder Left ? ?Result Date: 03/05/2022 ?CLINICAL DATA:  Left shoulder pain EXAM: LEFT SHOULDER - 3 VIEW COMPARISON:  None. FINDINGS: Fracture of the distal third of the left clavicle with minimal displacement of fracture fragments. No evidence of AC joint separation. Normal coracoclavicular distance. Diffuse demineralization. Soft tissues are unremarkable. IMPRESSION: Fracture of the distal third of the left clavicle. Electronically Signed   By: Yetta Glassman M.D.   On: 03/05/2022 14:11   ? ?Procedures ?Procedures  ? ? ?Medications Ordered in ED ?Medications - No data to display ? ?ED Course/ Medical Decision Making/ A&P ?Clinical Course as of 03/05/22 1803  ?Fri Mar 05, 2022  ?1440 Daughter and patient updated.  Daughter clarifies that patient does have an orthopedic doctor, that her prior fractures have been the RIGHT clavicle, not the left.  They have an arm sling already.  We are waiting on a UA, which we will try to collect right now, and I anticipate they can be discharged home after the results return.  Her daughter will drive her back to the facility [MT]  ?  ?Clinical Course User Index ?[MT] Wyvonnia Dusky, MD  ? ?                        ?Medical Decision Making ?Amount and/or Complexity of Data Reviewed ?Labs: ordered. ?Radiology: ordered. ? ?Risk ?OTC drugs. ? ? ?Supplement history found by the patient's daughter at bedside ?Patient is unfortunately poor historian due to her dementia ?She has evidence of head trauma shoulder trauma, I have ordered x-rays of the chest, left shoulder, and a CT scan of the head, which per my review left clavicle fracture ? ?Sling applied ? ?Urinalysis examined showing no  infection ? ? ? ? ? ? ? ?Final Clinical Impression(s) / ED Diagnoses ?Final diagnoses:  ?Closed nondisplaced fracture of acromial end of left clavicle, initial encounter  ?Injury of head, initial encounter  ?Fall, initial encou

## 2022-03-05 NOTE — ED Triage Notes (Addendum)
Fell on Monday, hit her rt eye and left side of head  and today  she is having problems with her left shoulder and she is not following  some commands pt is  in memory care at [facility] ?

## 2022-03-10 DIAGNOSIS — S42024D Nondisplaced fracture of shaft of right clavicle, subsequent encounter for fracture with routine healing: Secondary | ICD-10-CM | POA: Diagnosis not present

## 2022-03-10 DIAGNOSIS — M810 Age-related osteoporosis without current pathological fracture: Secondary | ICD-10-CM | POA: Diagnosis not present

## 2022-03-10 DIAGNOSIS — S42025D Nondisplaced fracture of shaft of left clavicle, subsequent encounter for fracture with routine healing: Secondary | ICD-10-CM | POA: Diagnosis not present

## 2022-03-24 DIAGNOSIS — S42024D Nondisplaced fracture of shaft of right clavicle, subsequent encounter for fracture with routine healing: Secondary | ICD-10-CM | POA: Diagnosis not present

## 2022-03-24 DIAGNOSIS — S42025D Nondisplaced fracture of shaft of left clavicle, subsequent encounter for fracture with routine healing: Secondary | ICD-10-CM | POA: Diagnosis not present

## 2022-05-06 DIAGNOSIS — N390 Urinary tract infection, site not specified: Secondary | ICD-10-CM | POA: Diagnosis not present

## 2022-05-17 ENCOUNTER — Emergency Department (HOSPITAL_COMMUNITY): Payer: Medicare PPO

## 2022-05-17 ENCOUNTER — Observation Stay (HOSPITAL_COMMUNITY)
Admission: EM | Admit: 2022-05-17 | Discharge: 2022-05-19 | Disposition: A | Discharge status: Skilled Nursing Facility | Payer: Medicare PPO | Attending: Internal Medicine | Admitting: Internal Medicine

## 2022-05-17 ENCOUNTER — Observation Stay (HOSPITAL_COMMUNITY): Payer: Medicare PPO

## 2022-05-17 ENCOUNTER — Other Ambulatory Visit: Payer: Self-pay

## 2022-05-17 ENCOUNTER — Encounter (HOSPITAL_COMMUNITY): Payer: Self-pay

## 2022-05-17 DIAGNOSIS — K7689 Other specified diseases of liver: Secondary | ICD-10-CM | POA: Diagnosis not present

## 2022-05-17 DIAGNOSIS — G9341 Metabolic encephalopathy: Secondary | ICD-10-CM | POA: Diagnosis not present

## 2022-05-17 DIAGNOSIS — I471 Supraventricular tachycardia: Secondary | ICD-10-CM | POA: Diagnosis not present

## 2022-05-17 DIAGNOSIS — R0600 Dyspnea, unspecified: Secondary | ICD-10-CM

## 2022-05-17 DIAGNOSIS — I672 Cerebral atherosclerosis: Secondary | ICD-10-CM | POA: Diagnosis not present

## 2022-05-17 DIAGNOSIS — K449 Diaphragmatic hernia without obstruction or gangrene: Secondary | ICD-10-CM | POA: Insufficient documentation

## 2022-05-17 DIAGNOSIS — R197 Diarrhea, unspecified: Secondary | ICD-10-CM | POA: Insufficient documentation

## 2022-05-17 DIAGNOSIS — R29810 Facial weakness: Secondary | ICD-10-CM | POA: Diagnosis not present

## 2022-05-17 DIAGNOSIS — J90 Pleural effusion, not elsewhere classified: Secondary | ICD-10-CM | POA: Diagnosis not present

## 2022-05-17 DIAGNOSIS — F0280 Dementia in other diseases classified elsewhere without behavioral disturbance: Secondary | ICD-10-CM | POA: Insufficient documentation

## 2022-05-17 DIAGNOSIS — I700 Atherosclerosis of aorta: Secondary | ICD-10-CM | POA: Insufficient documentation

## 2022-05-17 DIAGNOSIS — Z8679 Personal history of other diseases of the circulatory system: Secondary | ICD-10-CM | POA: Diagnosis not present

## 2022-05-17 DIAGNOSIS — R600 Localized edema: Secondary | ICD-10-CM | POA: Insufficient documentation

## 2022-05-17 DIAGNOSIS — Z8673 Personal history of transient ischemic attack (TIA), and cerebral infarction without residual deficits: Secondary | ICD-10-CM | POA: Diagnosis not present

## 2022-05-17 DIAGNOSIS — Z6822 Body mass index (BMI) 22.0-22.9, adult: Secondary | ICD-10-CM | POA: Diagnosis not present

## 2022-05-17 DIAGNOSIS — G319 Degenerative disease of nervous system, unspecified: Secondary | ICD-10-CM | POA: Diagnosis not present

## 2022-05-17 DIAGNOSIS — Z85030 Personal history of malignant carcinoid tumor of large intestine: Secondary | ICD-10-CM | POA: Diagnosis not present

## 2022-05-17 DIAGNOSIS — R4182 Altered mental status, unspecified: Secondary | ICD-10-CM | POA: Diagnosis not present

## 2022-05-17 DIAGNOSIS — R7989 Other specified abnormal findings of blood chemistry: Secondary | ICD-10-CM | POA: Insufficient documentation

## 2022-05-17 DIAGNOSIS — Z87440 Personal history of urinary (tract) infections: Secondary | ICD-10-CM | POA: Diagnosis not present

## 2022-05-17 DIAGNOSIS — D72829 Elevated white blood cell count, unspecified: Secondary | ICD-10-CM | POA: Diagnosis not present

## 2022-05-17 DIAGNOSIS — G301 Alzheimer's disease with late onset: Secondary | ICD-10-CM | POA: Diagnosis not present

## 2022-05-17 DIAGNOSIS — J9601 Acute respiratory failure with hypoxia: Secondary | ICD-10-CM | POA: Insufficient documentation

## 2022-05-17 DIAGNOSIS — R0902 Hypoxemia: Secondary | ICD-10-CM | POA: Diagnosis not present

## 2022-05-17 DIAGNOSIS — E785 Hyperlipidemia, unspecified: Secondary | ICD-10-CM | POA: Insufficient documentation

## 2022-05-17 DIAGNOSIS — Z91040 Latex allergy status: Secondary | ICD-10-CM | POA: Insufficient documentation

## 2022-05-17 DIAGNOSIS — R5383 Other fatigue: Secondary | ICD-10-CM | POA: Diagnosis present

## 2022-05-17 DIAGNOSIS — R531 Weakness: Secondary | ICD-10-CM | POA: Diagnosis not present

## 2022-05-17 DIAGNOSIS — J9811 Atelectasis: Secondary | ICD-10-CM | POA: Diagnosis not present

## 2022-05-17 DIAGNOSIS — J8489 Other specified interstitial pulmonary diseases: Secondary | ICD-10-CM | POA: Insufficient documentation

## 2022-05-17 DIAGNOSIS — N390 Urinary tract infection, site not specified: Secondary | ICD-10-CM | POA: Diagnosis not present

## 2022-05-17 DIAGNOSIS — R29818 Other symptoms and signs involving the nervous system: Secondary | ICD-10-CM | POA: Diagnosis not present

## 2022-05-17 DIAGNOSIS — K769 Liver disease, unspecified: Secondary | ICD-10-CM | POA: Diagnosis present

## 2022-05-17 DIAGNOSIS — Z79899 Other long term (current) drug therapy: Secondary | ICD-10-CM | POA: Diagnosis not present

## 2022-05-17 LAB — PROCALCITONIN: Procalcitonin: <0.1 ng/mL

## 2022-05-17 LAB — RESPIRATORY PANEL BY PCR

## 2022-05-17 LAB — CBC WITH DIFFERENTIAL/PLATELET
Abs Immature Granulocytes: 0.06 10*3/uL (ref 0.00–0.07)
Basophils Absolute: 0.1 10*3/uL (ref 0.0–0.1)
Basophils Relative: 1 %
Eosinophils Absolute: 0.4 10*3/uL (ref 0.0–0.5)
Eosinophils Relative: 3 %
HCT: 39.4 % (ref 36.0–46.0)
Hemoglobin: 12.9 g/dL (ref 12.0–15.0)
Immature Granulocytes: 0 %
Lymphocytes Relative: 5 %
Lymphs Abs: 0.7 10*3/uL (ref 0.7–4.0)
MCH: 29.5 pg (ref 26.0–34.0)
MCHC: 32.7 g/dL (ref 30.0–36.0)
MCV: 90.2 fL (ref 80.0–100.0)
Monocytes Absolute: 0.9 10*3/uL (ref 0.1–1.0)
Monocytes Relative: 6 %
Neutro Abs: 12.1 10*3/uL — ABNORMAL HIGH (ref 1.7–7.7)
Neutrophils Relative %: 85 %
Platelets: 364 10*3/uL (ref 150–400)
RBC: 4.37 MIL/uL (ref 3.87–5.11)
RDW: 12.3 % (ref 11.5–15.5)
WBC: 14.3 10*3/uL — ABNORMAL HIGH (ref 4.0–10.5)
nRBC: 0 % (ref 0.0–0.2)

## 2022-05-17 LAB — BLOOD GAS, VENOUS
Acid-Base Excess: 4 mmol/L — ABNORMAL HIGH (ref 0.0–2.0)
Bicarbonate: 29.7 mmol/L — ABNORMAL HIGH (ref 20.0–28.0)
O2 Saturation: 44.5 %
Patient temperature: 37
pCO2, Ven: 48 mmHg (ref 44–60)
pH, Ven: 7.4 (ref 7.25–7.43)
pO2, Ven: <31 mmHg — CL (ref 32–45)

## 2022-05-17 LAB — URINALYSIS, ROUTINE W REFLEX MICROSCOPIC
Bilirubin Urine: NEGATIVE
Glucose, UA: NEGATIVE mg/dL
Hgb urine dipstick: NEGATIVE
Ketones, ur: NEGATIVE mg/dL
Leukocytes,Ua: NEGATIVE
Nitrite: NEGATIVE
Protein, ur: NEGATIVE mg/dL
Specific Gravity, Urine: 1.011 (ref 1.005–1.030)
pH: 6 (ref 5.0–8.0)

## 2022-05-17 LAB — COMPREHENSIVE METABOLIC PANEL
ALT: 23 U/L (ref 0–44)
AST: 23 U/L (ref 15–41)
Albumin: 2.9 g/dL — ABNORMAL LOW (ref 3.5–5.0)
Alkaline Phosphatase: 91 U/L (ref 38–126)
Anion gap: 8 (ref 5–15)
BUN: 17 mg/dL (ref 8–23)
CO2: 27 mmol/L (ref 22–32)
Calcium: 9.1 mg/dL (ref 8.9–10.3)
Chloride: 101 mmol/L (ref 98–111)
Creatinine, Ser: 0.91 mg/dL (ref 0.44–1.00)
GFR, Estimated: >60 mL/min (ref >60)
Glucose, Bld: 119 mg/dL — ABNORMAL HIGH (ref 70–99)
Potassium: 3.8 mmol/L (ref 3.5–5.1)
Sodium: 136 mmol/L (ref 135–145)
Total Bilirubin: 0.8 mg/dL (ref 0.3–1.2)
Total Protein: 7 g/dL (ref 6.5–8.1)

## 2022-05-17 LAB — BRAIN NATRIURETIC PEPTIDE: B Natriuretic Peptide: 108.4 pg/mL — ABNORMAL HIGH (ref 0.0–100.0)

## 2022-05-17 LAB — TROPONIN I (HIGH SENSITIVITY)
Troponin I (High Sensitivity): 7 ng/L (ref <18)
Troponin I (High Sensitivity): 7 ng/L (ref <18)

## 2022-05-17 MED ORDER — ACETAMINOPHEN 325 MG PO TABS
650.0000 mg | ORAL_TABLET | Freq: Four times a day (QID) | ORAL | Status: DC | PRN
  Administered 2022-05-17: 650 mg via ORAL
  Filled 2022-05-17: qty 2

## 2022-05-17 MED ORDER — ONDANSETRON HCL 4 MG/2ML IJ SOLN: 4.0000 mg | Freq: Four times a day (QID) | INTRAMUSCULAR | Status: DC | PRN

## 2022-05-17 MED ORDER — LORAZEPAM 2 MG/ML IJ SOLN
0.5000 mg | INTRAMUSCULAR | Status: AC
  Administered 2022-05-17: 0.5 mg via INTRAVENOUS
  Filled 2022-05-17: qty 1

## 2022-05-17 MED ORDER — ACETAMINOPHEN 650 MG RE SUPP: 650.0000 mg | Freq: Four times a day (QID) | RECTAL | Status: DC | PRN

## 2022-05-17 MED ORDER — MELATONIN 3 MG PO TABS
3.0000 mg | ORAL_TABLET | Freq: Every day | ORAL | Status: DC
  Administered 2022-05-17 – 2022-05-18 (×2): 3 mg via ORAL
  Filled 2022-05-17 (×2): qty 1

## 2022-05-17 MED ORDER — ADULT MULTIVITAMIN W/MINERALS CH
1.0000 | ORAL_TABLET | Freq: Every day | ORAL | Status: DC
  Administered 2022-05-18 – 2022-05-19 (×2): 1 via ORAL
  Filled 2022-05-17 (×2): qty 1

## 2022-05-17 MED ORDER — VITAMIN B-12 1000 MCG PO TABS
500.0000 ug | ORAL_TABLET | Freq: Every day | ORAL | Status: DC
  Administered 2022-05-18 – 2022-05-19 (×2): 500 ug via ORAL
  Filled 2022-05-17 (×2): qty 1

## 2022-05-17 MED ORDER — IOHEXOL 350 MG/ML SOLN
80.0000 mL | Freq: Once | INTRAVENOUS | Status: AC | PRN
  Administered 2022-05-17: 80 mL via INTRAVENOUS

## 2022-05-17 MED ORDER — ONDANSETRON HCL 4 MG PO TABS: 4.0000 mg | ORAL_TABLET | Freq: Four times a day (QID) | ORAL | Status: DC | PRN

## 2022-05-17 MED ORDER — VITAMIN B-12 500 MCG PO TABS: 500.0000 ug | ORAL_TABLET | Freq: Every day | ORAL | Status: DC

## 2022-05-17 MED ORDER — ALBUTEROL SULFATE (2.5 MG/3ML) 0.083% IN NEBU: 2.5000 mg | INHALATION_SOLUTION | Freq: Four times a day (QID) | RESPIRATORY_TRACT | Status: DC | PRN

## 2022-05-17 MED ORDER — SODIUM CHLORIDE 0.9 % IV SOLN
1.0000 g | Freq: Once | INTRAVENOUS | Status: AC
  Administered 2022-05-17: 1 g via INTRAVENOUS
  Filled 2022-05-17: qty 10

## 2022-05-17 MED ORDER — CENTRUM SILVER 50+WOMEN PO TABS: ORAL_TABLET | Freq: Every day | ORAL | Status: DC

## 2022-05-17 MED ORDER — ESCITALOPRAM OXALATE 10 MG PO TABS
10.0000 mg | ORAL_TABLET | Freq: Every day | ORAL | Status: DC
  Administered 2022-05-17 – 2022-05-19 (×3): 10 mg via ORAL
  Filled 2022-05-17 (×3): qty 1

## 2022-05-17 MED ORDER — SODIUM CHLORIDE (PF) 0.9 % IJ SOLN
INTRAMUSCULAR | Status: AC
  Filled 2022-05-17: qty 50

## 2022-05-17 MED ORDER — MEMANTINE HCL 10 MG PO TABS
10.0000 mg | ORAL_TABLET | Freq: Two times a day (BID) | ORAL | Status: DC
  Administered 2022-05-17 – 2022-05-19 (×4): 10 mg via ORAL
  Filled 2022-05-17 (×4): qty 1

## 2022-05-17 NOTE — H&P (Signed)
History and Physical    Patient: Autumn Wallace IRS:[id number] DOB: August 12, 1932 DOA: 05/17/2022 DOS: the patient was seen and examined on 05/17/2022 PCP: Crist Infante, MD  Patient coming from: SNF  Chief Complaint: Lethargy  HPI: Autumn Wallace is a 88 y.o. female with medical history significant of dementia, SVT, HLD, TIA. Presenting with lethargy. History is from daughter at bedside and is limited as the patient has dementia and lives in memory care. The daughter reports that the patient has foul smelling urine early last week. Urine was sent and it was noted that she had a UTI. She was started on abx 6 days ago. She started having diarrhea, but the facility noted that this was a normal side effect. It was noted that the patient became more lethargic over the last couple of days. This morning it was noted that she may have some right facial droop, so her facility sent her to the ED for evaluation.     Review of Systems: unable to review all systems due to the inability of the patient to answer questions. Past Medical History:  Diagnosis Date   Acute pancreatitis    Adenomatous colon polyp 12/06/2002   Allergy    seasonal   Dementia ([place])    Diverticulosis    Gallstones    Hiatal hernia    Hyperlipidemia    Pancreatitis    Past Surgical History:  Procedure Laterality Date   CHOLECYSTECTOMY  2012   COLONOSCOPY     DILATION AND CURETTAGE OF UTERUS     TONSILLECTOMY AND ADENOIDECTOMY     Social History:  reports that she has never smoked. She has never used smokeless tobacco. She reports that she does not drink alcohol and does not use drugs.  Allergies  Allergen Reactions   Fosamax [Alendronate] Other (See Comments)    Unknown reaction Listed as allergy on MAR   Latex Other (See Comments)    Unknown reaction Listed as allergy on MAR   Tetanus Toxoids Other (See Comments)    Unknown reaction Listed as allergy on MAR   Vibra-Tab [Doxycycline] Other (See Comments)     Unknown reaction Listed as allergy on MAR    Family History  Problem Relation Age of Onset   Breast cancer Sister    Colon cancer Neg Hx     Prior to Admission medications   Medication Sig Start Date End Date Taking? Authorizing Provider  acetaminophen (TYLENOL) 325 MG tablet Take 2 tablets (650 mg total) by mouth every 6 (six) hours as needed for up to 30 doses for moderate pain or mild pain. Patient taking differently: Take 650 mg by mouth every 8 (eight) hours as needed for moderate pain or mild pain. 03/05/22  Yes Trifan, Carola Rhine, MD  cholecalciferol (VITAMIN D3) 25 MCG (1000 UNIT) tablet Take 2,000 Units by mouth daily.   Yes [provider]  Cranberry-Vitamin C-Vitamin E (CRANBERRY PLUS VITAMIN C PO) Take 1 capsule by mouth daily. Cranberry 4200 mg with Vitamin C (unknown strength)   Yes [provider]  docusate sodium (COLACE) 100 MG capsule Take 200 mg by mouth daily.   Yes [provider]  Ensure (ENSURE) Take 237 mLs by mouth daily.   Yes [provider]  escitalopram (LEXAPRO) 10 MG tablet Take 10 mg by mouth daily. 08/12/19  Yes [provider]  ipratropium (ATROVENT) 0.06 % nasal spray Place 1 spray into both nostrils 3 (three) times daily.   Yes [provider]  memantine (NAMENDA) 10 MG tablet Take 10 mg by mouth 2 (two) times daily. 08/07/19  Yes [provider]  metoprolol tartrate (LOPRESSOR) 25 MG tablet Take 0.5 tablets (12.5 mg total) by mouth 2 (two) times daily. 01/12/21  Yes Hilty, Nadean Corwin, MD  Multiple Vitamins-Minerals (CENTRUM SILVER 50+WOMEN PO) Take 1 tablet by mouth daily.   Yes [provider]  nitrofurantoin, macrocrystal-monohydrate, (MACROBID) 100 MG capsule Take 100 mg by mouth 2 (two) times daily. 7 day course. Begin 05/12/22. Pt on day 5.   Yes [provider]  polyethylene glycol powder (GLYCOLAX/MIRALAX) 17 GM/SCOOP powder Take 17 g by mouth daily.   Yes [provider]   vitamin B-12 (CYANOCOBALAMIN) 500 MCG tablet Take 500 mcg by mouth daily. 12/24/21  Yes [provider]    Physical Exam: Vitals:   05/17/22 1200 05/17/22 1230 05/17/22 1245 05/17/22 1300  BP: (!) 109/52 (!) 100/49 (!) 121/55 (!) 104/50  Pulse: 64 65 62 (!) 59  Resp: '19 17 16 16  '$ Temp:      TempSrc:      SpO2: 97% 98% 98% 96%  Weight:      Height:       General: 88 y.o. female resting in bed in NAD Eyes: PERRL, normal sclera ENMT: Nares patent w/o discharge, orophaynx clear, dentition normal, ears w/o discharge/lesions/ulcers Neck: Supple, trachea midline Cardiovascular: RRR, +S1, S2, no g/r, 3/6 SEM, equal pulses throughout Respiratory: CTABL, no w/r/r, normal WOB GI: BS+, NDNT, no masses noted, no organomegaly noted MSK: No e/c/c Skin: No rashes, bruises, ulcerations noted Neuro: tracking around room, but she is not following commands Psyc: pleasantly demented. She is calm.  Data Reviewed:  Na+  136 K+  3.8 Cl-  101 Glucose  119 Scr  0.91 WBC  14.3 BNP  108 Trp  7 -> 7  CTA chest 1. No pulmonary embolus. 2. Moderate left and mild right pleural effusions with associated left greater than right lower lobe atelectasis. 3. Dependent ground-glass attenuation in the upper lobes bilaterally is likely related to atelectasis. 4. Moderate to large hiatal hernia. 5. 36 x 29 mm low density lesion in the right lobe of the liver is incompletely characterized. Recommend nonemergent MRI of the abdomen without and with contrast for further evaluation. 6. Aortic Atherosclerosis (ICD10-I70.0).  CTH No evidence of acute intracranial abnormality.  Assessment and Plan: Acute metabolic encephalopathy Dementia     - place in obs, tele     - her neuro exam is difficult d/t her not being able to follow commands     - CTH is negative; check MRI brain if she will tolerate it     - also check VBG     - UA is negative     - blood Cx pending  New hypoxia     - now  requiring 2L [place]     - wean as able     - CTA as above; she's not particularly fluid overloaded     - she does have a white count, but no fever     - check procal and RVP     - she already got a dose of rocephin; I say right now we let that ride; if procal is positive, we continue     - otherwise, will add PRN nebs for now  HLD     - continue home regimen when confirmed  Hx of SVT     - she is on the brady  side; follow  Liver lesion     - as noted on CTA     - follow up outpt MRI  Advance Care Planning:   Code Status: FULL  Consults: None  Family Communication: w/ daughter at bedside  Severity of Illness: The appropriate patient status for this patient is OBSERVATION. Observation status is judged to be reasonable and necessary in order to provide the required intensity of service to ensure the patient's safety. The patient's presenting symptoms, physical exam findings, and initial radiographic and laboratory data in the context of their medical condition is felt to place them at decreased risk for further clinical deterioration. Furthermore, it is anticipated that the patient will be medically stable for discharge from the hospital within 2 midnights of admission.   Author: Jonnie Finner, DO 05/17/2022 1:23 PM  For on call review [email or web address].

## 2022-05-17 NOTE — ED Provider Notes (Signed)
[facility] DEPT Provider Note  CSN: [id number] Arrival date & time: 05/17/22 9373  Chief Complaint(s) No chief complaint on file.  HPI Autumn Wallace is a 88 y.o. female with PMH dementia, HLD, TIA who presents emergency department for evaluation of facial droop and altered mental status.  History obtained from patient's daughter as patient is demented and cannot provide an appropriate history.  She states that over the last 1 week patient has had a progressive decline in mental status and was initially diagnosed with a UTI and placed on Macrobid.  She has had associated diarrhea after starting this medication.  Staff at her facility noticed facial droop of unknown duration this morning and transferred the patient to the emergency department due to concern for possible stroke.  Per daughter, last known well was approximately 1 week ago.  Of note, patient does not have an oxygen requirement at home but arrives 89% on room air.   Past Medical History Past Medical History:  Diagnosis Date   Acute pancreatitis    Adenomatous colon polyp 12/06/2002   Allergy    seasonal   Dementia ([place])    Diverticulosis    Gallstones    Hiatal hernia    Hyperlipidemia    Pancreatitis    Patient Active Problem List   Diagnosis Date Noted   Late onset Alzheimer's disease without behavioral disturbance ([place]) 01/03/2020   Transient ischemic attack 01/03/2020   Numbness 01/03/2020   Strain of left trapezius muscle 11/17/2016   PSVT (paroxysmal supraventricular tachycardia) ([place]) 09/09/2016   Palpitations 08/24/2016   Dyslipidemia 08/28/2015   Screening for cardiovascular condition 08/28/2015   Chest pain 08/28/2015   Knee pain, right 06/25/2015   Osteopenia 06/20/2012   Gallstone pancreatitis 08/12/2011   Compression fracture of lumbosacral spine ([place]) 06/23/2010   METATARSALGIA 06/15/2010   Tendonitis, Achilles, left 06/15/2010   Home Medication(s) Prior to  Admission medications   Medication Sig Start Date End Date Taking? Authorizing Provider  acetaminophen (TYLENOL) 325 MG tablet Take 2 tablets (650 mg total) by mouth every 6 (six) hours as needed for up to 30 doses for moderate pain or mild pain. 03/05/22   Wyvonnia Dusky, MD  bifidobacterium infantis (ALIGN) capsule Take 1 capsule by mouth daily. 01/20/22   [provider]  cholecalciferol (VITAMIN D3) 25 MCG (1000 UNIT) tablet Take 1,000 Units by mouth daily.    [provider]  Cranberry 1000 MG CAPS Take 1,000 mg by mouth daily.    [provider]  divalproex (DEPAKOTE SPRINKLE) 125 MG capsule Take 62.5 mg by mouth daily. Patient not taking: Reported on 01/27/2022 07/10/21   [provider]  escitalopram (LEXAPRO) 10 MG tablet Take 10 mg by mouth daily. 08/12/19   [provider]  galantamine (RAZADYNE ER) 16 MG 24 hr capsule Take 16 mg by mouth daily. 07/29/19   [provider]  memantine (NAMENDA) 10 MG tablet Take 10 mg by mouth 2 (two) times daily. 08/07/19   [provider]  metoprolol tartrate (LOPRESSOR) 25 MG tablet Take 0.5 tablets (12.5 mg total) by mouth 2 (two) times daily. 01/12/21   Pixie Casino, MD  Multiple Vitamin (MULTIVITAMIN WITH MINERALS) TABS tablet Take 1 tablet by mouth daily.    [provider]  [place] Calcium 500 MG TABS Take 1 tablet by mouth 2 (two) times daily. 01/21/22   [provider]  PAIN RELIEF EXTRA STRENGTH 500 MG tablet Take by mouth as needed. 01/04/22  [provider]  triamcinolone cream (KENALOG) 0.1 % Apply 1 application topically 2 (two) times daily. 07/08/21   [provider]  vitamin B-12 (CYANOCOBALAMIN) 500 MCG tablet Take by mouth. 12/24/21   [provider]                                                                                                                                    Past Surgical History Past Surgical History:  Procedure  Laterality Date   CHOLECYSTECTOMY  2012   COLONOSCOPY     DILATION AND CURETTAGE OF UTERUS     TONSILLECTOMY AND ADENOIDECTOMY     Family History Family History  Problem Relation Age of Onset   Breast cancer Sister    Colon cancer Neg Hx     Social History Social History   Tobacco Use   Smoking status: Never   Smokeless tobacco: Never  Substance Use Topics   Alcohol use: No   Drug use: No   Allergies Latex  Review of Systems Review of Systems  Gastrointestinal:  Positive for diarrhea.  Neurological:  Positive for facial asymmetry.  Psychiatric/Behavioral:  Positive for confusion.     Physical Exam Vital Signs  I have reviewed the triage vital signs BP 94/66 (BP Location: Left Arm)   Pulse 70   Temp 98.1 F (36.7 C) (Oral)   Resp 19   Ht '5\' 3"'$  (1.6 m)   Wt 57.3 kg   SpO2 91%   BMI 22.38 kg/m   Physical Exam Vitals and nursing note reviewed.  Constitutional:      General: She is not in acute distress.    Appearance: She is well-developed.  HENT:     Head: Normocephalic and atraumatic.  Eyes:     Conjunctiva/sclera: Conjunctivae normal.  Cardiovascular:     Rate and Rhythm: Normal rate and regular rhythm.     Heart sounds: No murmur heard. Pulmonary:     Effort: Pulmonary effort is normal. No respiratory distress.     Breath sounds: Normal breath sounds.  Abdominal:     Palpations: Abdomen is soft.     Tenderness: There is no abdominal tenderness.  Musculoskeletal:        General: No swelling.     Cervical back: Neck supple.  Skin:    General: Skin is warm and dry.     Capillary Refill: Capillary refill takes less than 2 seconds.  Neurological:     Mental Status: She is alert.     Cranial Nerves: Cranial nerve deficit (Right-sided facial droop) present.  Psychiatric:        Mood and Affect: Mood normal.     ED Results and Treatments Labs (all labs ordered are listed, but only abnormal results are displayed) Labs Reviewed  COMPREHENSIVE  METABOLIC PANEL  CBC WITH DIFFERENTIAL/PLATELET  URINALYSIS, ROUTINE W REFLEX MICROSCOPIC  TROPONIN I (HIGH SENSITIVITY)  Radiology No results found.  Pertinent labs & imaging results that were available during my care of the patient were reviewed by me and considered in my medical decision making (see MDM for details).  Medications Ordered in ED Medications - No data to display                                                                                                                                   Procedures .Critical Care  Performed by: Teressa Lower, MD Authorized by: Teressa Lower, MD   Critical care provider statement:    Critical care time (minutes):  30   Critical care was necessary to treat or prevent imminent or life-threatening deterioration of the following conditions:  Respiratory failure   Critical care was time spent personally by me on the following activities:  Development of treatment plan with patient or surrogate, discussions with consultants, evaluation of patient's response to treatment, examination of patient, ordering and review of laboratory studies, ordering and review of radiographic studies, ordering and performing treatments and interventions, pulse oximetry, re-evaluation of patient's condition and review of old charts   (including critical care time)  Medical Decision Making / ED Course   This patient presents to the ED for concern of altered mental status, dyspnea, this involves an extensive number of treatment options, and is a complaint that carries with it a high risk of complications and morbidity.  The differential diagnosis includes UTI, pneumonia, electrolyte abnormality, new onset CHF  MDM: Patient seen emergency room for evaluation of multiple complaints described above.  Physical exam reveals a pleasantly demented  patient with rales at bilateral bases but is otherwise unremarkable.  Laboratory evaluation with leukocytosis to 14.3 but is otherwise unremarkable.  BNP elevated to 108.4.  Chest x-ray showing evidence of pulmonary edema with possible bilateral pneumococcal infiltrate.  Follow-up CT PE in the setting the patient's new oxygen requirement does not show evidence of PE but does show 2 new pleural effusions.  I am hesitant to overly diurese the patient here in the emergency department due to softer blood pressures, she will require gentle diuresis by the inpatient hospital team.  Patient given ceftriaxone empirically and admitted to the hospital last with concern for new oxygen requirement.  In regards the patient's facial droop, this is minimal and her CT head is negative.  Patient is outside the window for acute intervention from stroke standpoint and thus emergency MRI was not performed.   Additional history obtained: -Additional history obtained from daughter -External records from outside source obtained and reviewed including: Chart review including previous notes, labs, imaging, consultation notes   Lab Tests: -I ordered, reviewed, and interpreted labs.   The pertinent results include:   Labs Reviewed  COMPREHENSIVE METABOLIC PANEL  CBC WITH DIFFERENTIAL/PLATELET  URINALYSIS, ROUTINE W REFLEX MICROSCOPIC  TROPONIN I (HIGH SENSITIVITY)      EKG   EKG Interpretation  Date/Time:  Monday May 17 2022 08:31:05 EDT Ventricular Rate:  73 PR Interval:  138 QRS Duration: 82 QT Interval:  387 QTC Calculation: 427 R Axis:   29 Text Interpretation: Sinus rhythm Confirmed by [place] ([id number]) on 05/17/2022 4:43:23 PM         Imaging Studies ordered: I ordered imaging studies including CXR, CTPE, CTH I independently visualized and interpreted imaging. I agree with the radiologist interpretation   Medicines ordered and prescription drug management: No orders of the defined types  were placed in this encounter.   -I have reviewed the patients home medicines and have made adjustments as needed  Critical interventions Oxygen supplementation   Cardiac Monitoring: The patient was maintained on a cardiac monitor.  I personally viewed and interpreted the cardiac monitored which showed an underlying rhythm of: NSR  Social Determinants of Health:  Factors impacting patients care include: none   Reevaluation: After the interventions noted above, I reevaluated the patient and found that they have :improved  Co morbidities that complicate the patient evaluation  Past Medical History:  Diagnosis Date   Acute pancreatitis    Adenomatous colon polyp 12/06/2002   Allergy    seasonal   Dementia ([place])    Diverticulosis    Gallstones    Hiatal hernia    Hyperlipidemia    Pancreatitis       Dispostion: I considered admission for this patient, and given new oxygen requirement and fluid overload patient required mission     Final Clinical Impression(s) / ED Diagnoses Final diagnoses:  None     '@PCDICTATION'$ @    Teressa Lower, MD 05/17/22 1644

## 2022-05-17 NOTE — ED Notes (Signed)
Patient transported to CT 

## 2022-05-17 NOTE — ED Triage Notes (Signed)
Patient BIB EMS from [facility]. Patient was being treated for UTI a week ago when facial drooping was noted. Facial drooping progressively worsened over the past weak. Patient has hx of dementia.    102/51 136-CBG 16-RR 74-HR

## 2022-05-18 DIAGNOSIS — R4182 Altered mental status, unspecified: Secondary | ICD-10-CM | POA: Diagnosis not present

## 2022-05-18 LAB — CBC
HCT: 35.8 % — ABNORMAL LOW (ref 36.0–46.0)
Hemoglobin: 11.6 g/dL — ABNORMAL LOW (ref 12.0–15.0)
MCH: 29.8 pg (ref 26.0–34.0)
MCHC: 32.4 g/dL (ref 30.0–36.0)
MCV: 92 fL (ref 80.0–100.0)
Platelets: 292 10*3/uL (ref 150–400)
RBC: 3.89 MIL/uL (ref 3.87–5.11)
RDW: 12.2 % (ref 11.5–15.5)
WBC: 11.4 10*3/uL — ABNORMAL HIGH (ref 4.0–10.5)
nRBC: 0 % (ref 0.0–0.2)

## 2022-05-18 LAB — COMPREHENSIVE METABOLIC PANEL
ALT: 17 U/L (ref 0–44)
AST: 18 U/L (ref 15–41)
Albumin: 2.6 g/dL — ABNORMAL LOW (ref 3.5–5.0)
Alkaline Phosphatase: 86 U/L (ref 38–126)
Anion gap: 8 (ref 5–15)
BUN: 19 mg/dL (ref 8–23)
CO2: 26 mmol/L (ref 22–32)
Calcium: 9 mg/dL (ref 8.9–10.3)
Chloride: 105 mmol/L (ref 98–111)
Creatinine, Ser: 0.92 mg/dL (ref 0.44–1.00)
GFR, Estimated: 60 mL/min — ABNORMAL LOW (ref >60)
Glucose, Bld: 91 mg/dL (ref 70–99)
Potassium: 4.2 mmol/L (ref 3.5–5.1)
Sodium: 139 mmol/L (ref 135–145)
Total Bilirubin: 0.8 mg/dL (ref 0.3–1.2)
Total Protein: 5.9 g/dL — ABNORMAL LOW (ref 6.5–8.1)

## 2022-05-18 MED ORDER — IPRATROPIUM BROMIDE 0.06 % NA SOLN
1.0000 | Freq: Three times a day (TID) | NASAL | Status: DC
  Administered 2022-05-18 – 2022-05-19 (×2): 1 via NASAL
  Filled 2022-05-18: qty 15

## 2022-05-18 MED ORDER — POLYETHYLENE GLYCOL 3350 17 G PO PACK
17.0000 g | PACK | Freq: Every day | ORAL | Status: DC
  Administered 2022-05-18 – 2022-05-19 (×2): 17 g via ORAL
  Filled 2022-05-18 (×2): qty 1

## 2022-05-18 NOTE — Assessment & Plan Note (Signed)
Patient was still needing up to 2 L of oxygen.  No prior oxygen use.  Not much upper respiratory symptoms.  Respiratory viral panel negative. CTA was negative for PE but did show bilateral pleural effusions, left greater than right.  BNP mildly elevated at 108. Daughter does not want any thoracentesis. -Check echocardiogram. -Patient will get benefit from low-dose diuretic which can be started once blood pressure remained normal for another day. -She might need to go back to her memory care unit with 2 L of oxygen

## 2022-05-18 NOTE — Assessment & Plan Note (Signed)
Most likely secondary to mild hypoxia.  As she appears now at her baseline. No obvious source of infection at this time.  Preliminary blood cultures negative in 24 hours.  UA is not consistent with UTI.

## 2022-05-18 NOTE — Assessment & Plan Note (Signed)
-   Currently stable -Continue to monitor 

## 2022-05-18 NOTE — Hospital Course (Addendum)
Taken from H&P.  Patient is coming from SNF with main complaint of lethargy and altered mental status.  Autumn Wallace is a 88 y.o. female with medical history significant of dementia, SVT, HLD, TIA. Presenting with lethargy. History is from daughter at bedside and is limited as the patient has dementia and lives in memory care. The daughter reports that the patient has foul smelling urine early last week. Urine was sent and it was noted that she had a UTI. She was started on abx 6 days ago. She started having diarrhea, but the facility noted that this was a normal side effect. It was noted that the patient became more lethargic over the last couple of days. This morning it was noted that she may have some right facial droop, so her facility sent her to the ED for evaluation.     She was found to have mild leukocytosis at 14.3, blood pressure at 86/65, found to be hypoxic requiring 2 to 3 L of oxygen, troponin negative, no other lab significant lab abnormality.  UA negative for UTI.  Procalcitonin negative.  Respiratory viral panel negative.  Blood cultures pending.  CT head and MRI brain was negative for any acute intracranial abnormality. CTA chest was negative for PE, did show moderate left and mild right pleural effusions with associated left greater than right lower lobe atelectasis.  Moderate to large hiatal hernia. Also noted a 36 x 29 mm low-density lesion in the right lobe of liver which is incompletely characterized.  And nonemergent MRI with and without contrast of abdomen was recommended.  Patient received a dose of ceftriaxone in ED which was not continued as there is still no obvious infection.  6/13: Patient remained on 2 L of oxygen, no baseline oxygen use.  CTA with concern of bilateral pleural effusion, left greater than right.  Discussed with daughter and she does not want any thoracentesis.  BNP mildly elevated at 108.  Ordered echocardiogram, we can start her on diuretic once  blood pressure improves little more.

## 2022-05-18 NOTE — Assessment & Plan Note (Signed)
Patient oriented to self only at baseline. Unable to communicate with other people. -Appears to be at baseline -Continue home dose of Namenda

## 2022-05-18 NOTE — Progress Notes (Signed)
Progress Note   Patient: Autumn Wallace MLY:[id number] DOB: 1932-04-01 DOA: 05/17/2022     0 DOS: the patient was seen and examined on 05/18/2022   Brief hospital course: Taken from H&P.  Patient is coming from SNF with main complaint of lethargy and altered mental status.  VALIA WINGARD is a 88 y.o. female with medical history significant of dementia, SVT, HLD, TIA. Presenting with lethargy. History is from daughter at bedside and is limited as the patient has dementia and lives in memory care. The daughter reports that the patient has foul smelling urine early last week. Urine was sent and it was noted that she had a UTI. She was started on abx 6 days ago. She started having diarrhea, but the facility noted that this was a normal side effect. It was noted that the patient became more lethargic over the last couple of days. This morning it was noted that she may have some right facial droop, so her facility sent her to the ED for evaluation.     She was found to have mild leukocytosis at 14.3, blood pressure at 86/65, found to be hypoxic requiring 2 to 3 L of oxygen, troponin negative, no other lab significant lab abnormality.  UA negative for UTI.  Procalcitonin negative.  Respiratory viral panel negative.  Blood cultures pending.  CT head and MRI brain was negative for any acute intracranial abnormality. CTA chest was negative for PE, did show moderate left and mild right pleural effusions with associated left greater than right lower lobe atelectasis.  Moderate to large hiatal hernia. Also noted a 36 x 29 mm low-density lesion in the right lobe of liver which is incompletely characterized.  And nonemergent MRI with and without contrast of abdomen was recommended.  Patient received a dose of ceftriaxone in ED which was not continued as there is still no obvious infection.  6/13: Patient remained on 2 L of oxygen, no baseline oxygen use.  CTA with concern of bilateral pleural effusion, left  greater than right.  Discussed with daughter and she does not want any thoracentesis.  BNP mildly elevated at 108.  Ordered echocardiogram, we can start her on diuretic once blood pressure improves little more.   Assessment and Plan: * AMS (altered mental status) Most likely secondary to mild hypoxia.  As she appears now at her baseline. No obvious source of infection at this time.  Preliminary blood cultures negative in 24 hours.  UA is not consistent with UTI.  Acute hypoxic respiratory failure Patient was still needing up to 2 L of oxygen.  No prior oxygen use.  Not much upper respiratory symptoms.  Respiratory viral panel negative. CTA was negative for PE but did show bilateral pleural effusions, left greater than right.  BNP mildly elevated at 108. Daughter does not want any thoracentesis. -Check echocardiogram. -Patient will get benefit from low-dose diuretic which can be started once blood pressure remained normal for another day. -She might need to go back to her memory care unit with 2 L of oxygen  Late onset Alzheimer's disease without behavioral disturbance ([place]) Patient oriented to self only at baseline. Unable to communicate with other people. -Appears to be at baseline -Continue home dose of Namenda  Liver lesion, right lobe CTA concerning for a small ill-defined liver lesion which will need a nonemergent liver MRI, discussed with daughter and her PCP should be able to obtain if family decided to get more information.  PSVT (paroxysmal supraventricular tachycardia) ([facility]) Currently  stable. -Continue to monitor    Subjective: Patient was seen and examined today.  Sitting comfortably in chair.  Denies any complaint.  She is oriented to self only and unable to have meaningful communication.  Physical Exam: Vitals:   05/17/22 1757 05/17/22 2249 05/18/22 0430 05/18/22 1314  BP: 129/63 (!) 97/56 98/74 110/85  Pulse: 68 64 62 69  Resp: '16 18 16   '$ Temp: 98.2 F (36.8 C)  97.9 F (36.6 C) 98.3 F (36.8 C) 98 F (36.7 C)  TempSrc:    Oral  SpO2: 93% 100% 97% 94%  Weight:      Height:       General.  Frail elderly lady, in no acute distress. Pulmonary.  Lungs clear bilaterally, normal respiratory effort. CV.  Regular rate and rhythm, no JVD, rub or murmur. Abdomen.  Soft, nontender, nondistended, BS positive. CNS.  Alert and oriented to self only.  No focal neurologic deficit. Extremities.  No edema, no cyanosis, pulses intact and symmetrical. Psychiatry.  Judgment and insight appears impaired.  Data Reviewed: Prior notes, labs and images reviewed  Family Communication: Discussed with daughter at bedside  Disposition: Status is: Observation The patient remains OBS appropriate and will d/c before 2 midnights.  Planned Discharge Destination: Memory care unit  Time spent: 45 minutes  This record has been created using Systems analyst. Errors have been sought and corrected,but may not always be located. Such creation errors do not reflect on the standard of care.  Author: Lorella Nimrod, MD 05/18/2022 2:48 PM  For on call review [email or web address].

## 2022-05-18 NOTE — Plan of Care (Signed)
  Problem: Clinical Measurements: Goal: Respiratory complications will improve Outcome: Progressing   Problem: Activity: Goal: Risk for activity intolerance will decrease Outcome: Progressing   Problem: Nutrition: Goal: Adequate nutrition will be maintained Outcome: Progressing   

## 2022-05-18 NOTE — Assessment & Plan Note (Signed)
CTA concerning for a small ill-defined liver lesion which will need a nonemergent liver MRI, discussed with daughter and her PCP should be able to obtain if family decided to get more information.

## 2022-05-19 ENCOUNTER — Observation Stay (HOSPITAL_BASED_OUTPATIENT_CLINIC_OR_DEPARTMENT_OTHER): Payer: Medicare PPO

## 2022-05-19 DIAGNOSIS — R4182 Altered mental status, unspecified: Secondary | ICD-10-CM | POA: Diagnosis not present

## 2022-05-19 DIAGNOSIS — R0609 Other forms of dyspnea: Secondary | ICD-10-CM

## 2022-05-19 DIAGNOSIS — R410 Disorientation, unspecified: Secondary | ICD-10-CM | POA: Diagnosis not present

## 2022-05-19 DIAGNOSIS — R531 Weakness: Secondary | ICD-10-CM | POA: Diagnosis not present

## 2022-05-19 DIAGNOSIS — Z7401 Bed confinement status: Secondary | ICD-10-CM | POA: Diagnosis not present

## 2022-05-19 DIAGNOSIS — I959 Hypotension, unspecified: Secondary | ICD-10-CM | POA: Diagnosis not present

## 2022-05-19 LAB — ECHOCARDIOGRAM COMPLETE
AR max vel: 2.32 cm2
AV Peak grad: 9.5 mmHg
Ao pk vel: 1.55 m/s
Area-P 1/2: 3.08 cm2
Height: 63 in
S' Lateral: 2.1 cm
Weight: 2021.18 oz

## 2022-05-19 NOTE — TOC Progression Note (Addendum)
Transition of Care [facility]) - Progression Note    Patient Details  Name: Autumn Wallace MRN: [id number] Date of Birth: [birth year]  [facility] CM/SW Contact  Purcell Mouton, RN Phone Number: 05/19/2022, 9:53 AM  Clinical Narrative:     Pt is from [facility].   Expected Discharge Plan: Assisted Living Barriers to Discharge: No Barriers Identified  Expected Discharge Plan and Services Expected Discharge Plan: Assisted Living       Living arrangements for the past 2 months: Assisted Living Facility                                       Social Determinants of Health (SDOH) Interventions    Readmission Risk Interventions     No data to display

## 2022-05-19 NOTE — Progress Notes (Incomplete)
PROGRESS NOTE    Autumn Wallace  HKV:[id number] DOB: 1931/12/22 DOA: 05/17/2022 PCP: Crist Infante, MD (Confirm with patient/family/NH records and if not entered, this HAS to be entered at [facility] point of entry. "No PCP" if truly none.)   Brief Narrative: (Start on day 1 of progress note - keep it brief and live) ***   Assessment & Plan:   Principal Problem:   AMS (altered mental status) Active Problems:   Acute hypoxic respiratory failure   Late onset Alzheimer's disease without behavioral disturbance (HCC)   Liver lesion, right lobe   PSVT (paroxysmal supraventricular tachycardia) (HCC)                        Estimated body mass index is 22.38 kg/m as calculated from the following:   Height as of this encounter: '5\' 3"'$  (1.6 m).   Weight as of this encounter: 57.3 kg.  DVT prophylaxis: (Lovenox/Heparin/SCD's/anticoagulated/None (if comfort care) Code Status: (Full/Partial - specify details) Family Communication: (Specify name, relationship & date discussed. NO "discussed with patient") Disposition Plan:  Status is: Observation {Observation:23811}   Consultants:  ***  Procedures: (Don't include imaging studies which can be auto populated. Include things that cannot be auto populated i.e. Echo, Carotid and venous dopplers, Foley, Bipap, HD, tubes/drains, wound vac, central lines etc) ***  Antimicrobials: (specify start and planned stop date. Auto populated tables are space occupying and do not give end dates) ***    Subjective: ***  Objective: Vitals:   05/18/22 2225 05/19/22 0315 05/19/22 0316 05/19/22 0320  BP: 127/81 (!) (P) 95/48 (!) 95/48 101/60  Pulse: 72  64   Resp: '18 20 16   '$ Temp: 97.7 F (36.5 C) (P) 98 F (36.7 C) 98 F (36.7 C)   TempSrc:  (P) Axillary    SpO2: 98%  97%   Weight:      Height:        Intake/Output Summary (Last 24 hours) at 05/19/2022 1115 Last data filed at 05/18/2022 2300 Gross per 24 hour  Intake 180 ml   Output 301 ml  Net -121 ml   Filed Weights   05/17/22 0833  Weight: 57.3 kg    Examination:  General exam: Appears calm and comfortable  Respiratory system: Clear to auscultation. Respiratory effort normal. Cardiovascular system: S1 & S2 heard, RRR. No JVD, murmurs, rubs, gallops or clicks. No pedal edema. Gastrointestinal system: Abdomen is nondistended, soft and nontender. No organomegaly or masses felt. Normal bowel sounds heard. Central nervous system: Alert and oriented. No focal neurological deficits. Extremities: Symmetric 5 x 5 power. Skin: No rashes, lesions or ulcers Psychiatry: Judgement and insight appear normal. Mood & affect appropriate.     Data Reviewed: I have personally reviewed following labs and imaging studies  CBC: Recent Labs  Lab 05/17/22 0858 05/18/22 0356  WBC 14.3* 11.4*  NEUTROABS 12.1*  --   HGB 12.9 11.6*  HCT 39.4 35.8*  MCV 90.2 92.0  PLT 364 564   Basic Metabolic Panel: Recent Labs  Lab 05/17/22 0858 05/18/22 0356  NA 136 139  K 3.8 4.2  CL 101 105  CO2 27 26  GLUCOSE 119* 91  BUN 17 19  CREATININE 0.91 0.92  CALCIUM 9.1 9.0   GFR: Estimated Creatinine Clearance: 35 mL/min (by C-G formula based on SCr of 0.92 mg/dL). Liver Function Tests: Recent Labs  Lab 05/17/22 0858 05/18/22 0356  AST 23 18  ALT 23 17  ALKPHOS 91 86  BILITOT 0.8 0.8  PROT 7.0 5.9*  ALBUMIN 2.9* 2.6*   No results for input(s): "LIPASE", "AMYLASE" in the last 168 hours. No results for input(s): "AMMONIA" in the last 168 hours. Coagulation Profile: No results for input(s): "INR", "PROTIME" in the last 168 hours. Cardiac Enzymes: No results for input(s): "CKTOTAL", "CKMB", "CKMBINDEX", "TROPONINI" in the last 168 hours. BNP (last 3 results) No results for input(s): "PROBNP" in the last 8760 hours. HbA1C: No results for input(s): "HGBA1C" in the last 72 hours. CBG: No results for input(s): "GLUCAP" in the last 168 hours. Lipid Profile: No  results for input(s): "CHOL", "HDL", "LDLCALC", "TRIG", "CHOLHDL", "LDLDIRECT" in the last 72 hours. Thyroid Function Tests: No results for input(s): "TSH", "T4TOTAL", "FREET4", "T3FREE", "THYROIDAB" in the last 72 hours. Anemia Panel: No results for input(s): "VITAMINB12", "FOLATE", "FERRITIN", "TIBC", "IRON", "RETICCTPCT" in the last 72 hours. Sepsis Labs: Recent Labs  Lab 05/17/22 1055  PROCALCITON <0.10    Recent Results (from the past 240 hour(s))  Blood culture (routine x 2)     Status: None (Preliminary result)   Collection Time: 05/17/22 12:10 PM   Specimen: BLOOD  Result Value Ref Range Status   Specimen Description   Final    BLOOD BLOOD LEFT FOREARM Performed at [facility] [address], [place], [place] [address]    Special Requests   Final    BOTTLES DRAWN AEROBIC ONLY Blood Culture adequate volume Performed at [facility] [address] [place], [place] [address]    Culture   Final    NO GROWTH 2 DAYS Performed at [facility] [address], [place], [place] [address]    Report Status PENDING  Incomplete  Blood culture (routine x 2)     Status: None (Preliminary result)   Collection Time: 05/17/22 12:11 PM   Specimen: BLOOD  Result Value Ref Range Status   Specimen Description   Final    BLOOD LEFT ANTECUBITAL Performed at [facility] [address] [place], [place] [address]    Special Requests   Final    BOTTLES DRAWN AEROBIC AND ANAEROBIC Blood Culture adequate volume Performed at [facility] [address] [place], [place] [address]    Culture   Final    NO GROWTH 2 DAYS Performed at [facility] [address], [place],  [address]    Report Status PENDING  Incomplete  Respiratory (~20 pathogens) panel by PCR     Status: None   Collection Time: 05/17/22  2:52 PM   Specimen: Nasopharyngeal Swab; Respiratory  Result Value Ref Range Status    Adenovirus NOT DETECTED NOT DETECTED Final   Coronavirus 229E NOT DETECTED NOT DETECTED Final    Comment: (NOTE) The Coronavirus on the Respiratory Panel, DOES NOT test for the novel  Coronavirus (2019 nCoV)    Coronavirus HKU1 NOT DETECTED NOT DETECTED Final   Coronavirus NL63 NOT DETECTED NOT DETECTED Final   Coronavirus OC43 NOT DETECTED NOT DETECTED Final   Metapneumovirus NOT DETECTED NOT DETECTED Final   Rhinovirus / Enterovirus NOT DETECTED NOT DETECTED Final   Influenza A NOT DETECTED NOT DETECTED Final   Influenza B NOT DETECTED NOT DETECTED Final   Parainfluenza Virus 1 NOT DETECTED NOT DETECTED Final   Parainfluenza Virus 2 NOT DETECTED NOT DETECTED Final   Parainfluenza Virus 3 NOT DETECTED NOT DETECTED Final   Parainfluenza Virus 4 NOT DETECTED NOT DETECTED Final   Respiratory  Syncytial Virus NOT DETECTED NOT DETECTED Final   Bordetella pertussis NOT DETECTED NOT DETECTED Final   Bordetella Parapertussis NOT DETECTED NOT DETECTED Final   Chlamydophila pneumoniae NOT DETECTED NOT DETECTED Final   Mycoplasma pneumoniae NOT DETECTED NOT DETECTED Final    Comment: Performed at [facility], [place] [address], [place], [place] [address]         Radiology Studies: MR BRAIN WO CONTRAST  Result Date: 05/17/2022 CLINICAL DATA:  Transient ischemic attack (TIA) EXAM: MRI HEAD WITHOUT CONTRAST TECHNIQUE: Multiplanar, multiecho pulse sequences of the brain and surrounding structures were obtained without intravenous contrast. COMPARISON:  CT head from the same day. FINDINGS: Brain: No acute infarction, hemorrhage, hydrocephalus, extra-axial collection or mass lesion. Cerebral atrophy. Vascular: Major arterial flow voids are maintained skull base. Skull and upper cervical spine: Normal marrow signal. Sinuses/Orbits: Clear sinuses.  No acute orbital findings. Other: No mastoid effusions. IMPRESSION: No evidence of acute intracranial abnormality. Electronically Signed   By: Margaretha Sheffield M.D.   On: 05/17/2022 16:14   CT Angio Chest PE W and/or Wo Contrast  Result Date: 05/17/2022 CLINICAL DATA:  PULMONARY EMBOLISM SUSPECTED, HIGH PROBABILITY. PATIENT IS UNDERGOING TREATMENT FOR URINARY TRACT INFECTION. EXAM: CT ANGIOGRAPHY CHEST WITH CONTRAST TECHNIQUE: Multidetector CT imaging of the chest was performed using the standard protocol during bolus administration of intravenous contrast. Multiplanar CT image reconstructions and MIPs were obtained to evaluate the vascular anatomy. RADIATION DOSE REDUCTION: This exam was performed according to the departmental dose-optimization program which includes automated exposure control, adjustment of the mA and/or kV according to patient size and/or use of iterative reconstruction technique. CONTRAST:  76m OMNIPAQUE IOHEXOL 350 MG/ML SOLN COMPARISON:  Two-view chest x-ray 05/17/2022. FINDINGS: Cardiovascular: Heart is upper limits of normal for size. Atherosclerotic calcifications are present the aortic arch, great vessel origins and descending thoracic aorta without focal aneurysm or stenosis. Pulmonary artery opacification is excellent. No focal filling defects are present to suggest pulmonary emboli. Mediastinum/Nodes: Moderate to large hiatal hernia is present. No significant mediastinal, hilar, or axillary adenopathy is present. Esophagus is otherwise normal. Thoracic inlet is within normal limits. Lungs/Pleura: Moderate left and mild right pleural effusion is present. Left greater than right lower lobe atelectasis is associated. Some dependent ground-glass attenuation is present the upper lobes bilaterally. No other nodule or mass lesion is present. No other significant airspace consolidation is present. Upper Abdomen: Low density lesion in the right lobe of the liver on image 104 of series 4 measures 36 x 29 mm. Punctate calcification also present within the right lobe. No other solid organ lesions are present. Musculoskeletal: No chest wall  abnormality. No acute or significant osseous findings. Review of the MIP images confirms the above findings. IMPRESSION: 1. No pulmonary embolus. 2. Moderate left and mild right pleural effusions with associated left greater than right lower lobe atelectasis. 3. Dependent ground-glass attenuation in the upper lobes bilaterally is likely related to atelectasis. 4. Moderate to large hiatal hernia. 5. 36 x 29 mm low density lesion in the right lobe of the liver is incompletely characterized. Recommend nonemergent MRI of the abdomen without and with contrast for further evaluation. 6. Aortic Atherosclerosis (ICD10-I70.0). Electronically Signed   By: CSan MorelleM.D.   On: 05/17/2022 11:36        Scheduled Meds:  escitalopram  10 mg Oral Daily   ipratropium  1 spray Each Nare TID   melatonin  3 mg Oral QHS   memantine  10 mg Oral  BID   multivitamin with minerals  1 tablet Oral Daily   polyethylene glycol  17 g Oral Daily   vitamin B-12  500 mcg Oral Daily   Continuous Infusions:   LOS: 0 days    Time spent: 39 min  Georgette Shell, MD 05/19/2022, 11:15 AM

## 2022-05-19 NOTE — NC FL2 (Addendum)
[place] MEDICAID FL2 LEVEL OF CARE SCREENING TOOL     IDENTIFICATION  Patient Name: Autumn Wallace Birthdate: 08/16/1932 Sex: female Admission Date (Current Location): 05/17/2022  [facility] and [place] Number:  Herbalist and Address:  [facility],  [place], [place]      Provider Number: [id number]  Attending Physician Name and Address:  Georgette Shell, MD  Relative Name and Phone Number:  Salina April Daughter [phone], Luana Shu Daughter [phone]    Current Level of Care: Hospital Recommended Level of Care: [place], Memory Care Prior Approval Number:    Date Approved/Denied:   PASRR Number:    Discharge Plan: Other (Comment) (ALF)    Current Diagnoses: Patient Active Problem List   Diagnosis Date Noted   AMS (altered mental status) 05/17/2022   Acute hypoxic respiratory failure 05/17/2022   Liver lesion, right lobe 05/17/2022   Late onset Alzheimer's disease without behavioral disturbance ([place]) 01/03/2020   Transient ischemic attack 01/03/2020   PSVT (paroxysmal supraventricular tachycardia) ([place]) 09/09/2016   Osteopenia 06/20/2012   Compression fracture of lumbosacral spine ([place]) 06/23/2010   Tendonitis, Achilles, left 06/15/2010    Orientation RESPIRATION BLADDER Height & Weight      (Disoriented)  Normal Continent Weight: 57.3 kg Height:  '5\' 3"'$  (160 cm)  BEHAVIORAL SYMPTOMS/MOOD NEUROLOGICAL BOWEL NUTRITION STATUS      Continent Diet (Heart Healthy)  AMBULATORY STATUS COMMUNICATION OF NEEDS Skin   Extensive Assist Verbally Normal                       Personal Care Assistance Level of Assistance  Bathing, Feeding, Dressing Bathing Assistance: Maximum assistance Feeding assistance: Limited assistance Dressing Assistance: Maximum assistance     Functional Limitations Info  Sight, Hearing, Speech Sight Info: Adequate Hearing Info: Adequate Speech Info: Adequate     SPECIAL CARE FACTORS FREQUENCY  PT (By licensed PT), OT (By licensed OT)     PT Frequency: Eval and Treat OT Frequency: Eval and Treat            Contractures Contractures Info: Not present    Additional Factors Info  Code Status, Allergies Code Status Info: FULL Allergies Info: Fosamax (Alendronate), Latex, Tetanus Toxoids, Vibra-tab (Doxycycline)           Current Medications (05/19/2022):  This is the current hospital active medication list Current Facility-Administered Medications  Medication Dose Route Frequency Provider Last Rate Last Admin   acetaminophen (TYLENOL) tablet 650 mg  650 mg Oral Q6H PRN Marylyn Ishihara, Tyrone A, DO   650 mg at 05/17/22 2148   Or   acetaminophen (TYLENOL) suppository 650 mg  650 mg Rectal Q6H PRN Marylyn Ishihara, Tyrone A, DO       albuterol (PROVENTIL) (2.5 MG/3ML) 0.083% nebulizer solution 2.5 mg  2.5 mg Nebulization Q6H PRN Marylyn Ishihara, Tyrone A, DO       escitalopram (LEXAPRO) tablet 10 mg  10 mg Oral Daily Kyle, Tyrone A, DO   10 mg at 05/19/22 1049   ipratropium (ATROVENT) 0.06 % nasal spray 1 spray  1 spray Each Nare TID Lorella Nimrod, MD   1 spray at 05/18/22 2217   melatonin tablet 3 mg  3 mg Oral QHS Kyle, Tyrone A, DO   3 mg at 05/18/22 2213   memantine (NAMENDA) tablet 10 mg  10 mg Oral BID Marylyn Ishihara, Tyrone A, DO   10 mg at 05/19/22 1049   multivitamin with minerals tablet 1 tablet  1 tablet Oral Daily Marylyn Ishihara, Tyrone A, DO   1 tablet at 05/19/22 1049   ondansetron (ZOFRAN) tablet 4 mg  4 mg Oral Q6H PRN Marylyn Ishihara, Tyrone A, DO       Or   ondansetron (ZOFRAN) injection 4 mg  4 mg Intravenous Q6H PRN Marylyn Ishihara, Tyrone A, DO       polyethylene glycol (MIRALAX / GLYCOLAX) packet 17 g  17 g Oral Daily Lorella Nimrod, MD   17 g at 05/19/22 1050   vitamin B-12 (CYANOCOBALAMIN) tablet 500 mcg  500 mcg Oral Daily Marylyn Ishihara, Tyrone A, DO   500 mcg at 05/19/22 1048     Discharge Medications: Current Facility-Administered Medications  Medication Dose Route Frequency Provider Last  Rate Last Admin   acetaminophen (TYLENOL) tablet 650 mg  650 mg Oral Q6H PRN Marylyn Ishihara, Tyrone A, DO   650 mg at 05/17/22 2148   Or   acetaminophen (TYLENOL) suppository 650 mg  650 mg Rectal Q6H PRN Marylyn Ishihara, Tyrone A, DO       albuterol (PROVENTIL) (2.5 MG/3ML) 0.083% nebulizer solution 2.5 mg  2.5 mg Nebulization Q6H PRN Marylyn Ishihara, Tyrone A, DO       escitalopram (LEXAPRO) tablet 10 mg  10 mg Oral Daily Kyle, Tyrone A, DO   10 mg at 05/19/22 1049   ipratropium (ATROVENT) 0.06 % nasal spray 1 spray  1 spray Each Nare TID Lorella Nimrod, MD   1 spray at 05/18/22 2217   melatonin tablet 3 mg  3 mg Oral QHS Kyle, Tyrone A, DO   3 mg at 05/18/22 2213   memantine (NAMENDA) tablet 10 mg  10 mg Oral BID Marylyn Ishihara, Tyrone A, DO   10 mg at 05/19/22 1049   multivitamin with minerals tablet 1 tablet  1 tablet Oral Daily Kyle, Tyrone A, DO   1 tablet at 05/19/22 1049   ondansetron (ZOFRAN) tablet 4 mg  4 mg Oral Q6H PRN Marylyn Ishihara, Tyrone A, DO       Or   ondansetron (ZOFRAN) injection 4 mg  4 mg Intravenous Q6H PRN Kyle, Tyrone A, DO       polyethylene glycol (MIRALAX / GLYCOLAX) packet 17 g  17 g Oral Daily Lorella Nimrod, MD   17 g at 05/19/22 1050   vitamin B-12 (CYANOCOBALAMIN) tablet 500 mcg  500 mcg Oral Daily Kyle, Tyrone A, DO   500 mcg at 05/19/22 1048    Relevant Imaging Results:  Relevant Lab Results:   Additional Information [id number]  Purcell Mouton, RN

## 2022-05-19 NOTE — Progress Notes (Signed)
TAKE these medications     acetaminophen 325 MG tablet Commonly known as: Tylenol Take 2 tablets (650 mg total) by mouth every 6 (six) hours as needed for up to 30 doses for moderate pain or mild pain. What changed: when to take this    CENTRUM SILVER 50+WOMEN PO Take 1 tablet by mouth daily.    cholecalciferol 25 MCG (1000 UNIT) tablet Commonly known as: VITAMIN D3 Take 2,000 Units by mouth daily.    CRANBERRY PLUS VITAMIN C PO Take 1 capsule by mouth daily. Cranberry 4200 mg with Vitamin C (unknown strength)    docusate sodium 100 MG capsule Commonly known as: COLACE Take 200 mg by mouth daily.    Ensure Take 237 mLs by mouth daily.    escitalopram 10 MG tablet Commonly known as: LEXAPRO Take 10 mg by mouth daily.    ipratropium 0.06 % nasal spray Commonly known as: ATROVENT Place 1 spray into both nostrils 3 (three) times daily.    memantine 10 MG tablet Commonly known as: NAMENDA Take 10 mg by mouth 2 (two) times daily.    polyethylene glycol powder 17 GM/SCOOP powder Commonly known as: GLYCOLAX/MIRALAX Take 17 g by mouth daily.    vitamin B-12 500 MCG tablet Commonly known as: CYANOCOBALAMIN Take 500 mcg by mouth daily.

## 2022-05-19 NOTE — Progress Notes (Signed)
Called report 21, spoke with RN and pt's daughter. [facility] has arrived to transport pt to [facility]. IV removed intact. Jerene Pitch

## 2022-05-19 NOTE — NC FL2 (Deleted)
[place] MEDICAID FL2 LEVEL OF CARE SCREENING TOOL     IDENTIFICATION  Patient Name: Autumn Wallace Birthdate: 06-Feb-1932 Sex: female Admission Date (Current Location): 05/17/2022  [facility] and [place] Number:  Herbalist and Address:  [facility],  [place], [place]      Provider Number: [id number]  Attending Physician Name and Address:  Georgette Shell, MD  Relative Name and Phone Number:  Salina April Daughter [phone], Luana Shu Daughter [phone]    Current Level of Care: Hospital Recommended Level of Care: [place], Memory Care Prior Approval Number:    Date Approved/Denied:   PASRR Number:    Discharge Plan: Other (Comment) (ALF)    Current Diagnoses: Patient Active Problem List   Diagnosis Date Noted   AMS (altered mental status) 05/17/2022   Acute hypoxic respiratory failure 05/17/2022   Liver lesion, right lobe 05/17/2022   Late onset Alzheimer's disease without behavioral disturbance ([place]) 01/03/2020   Transient ischemic attack 01/03/2020   PSVT (paroxysmal supraventricular tachycardia) ([place]) 09/09/2016   Osteopenia 06/20/2012   Compression fracture of lumbosacral spine ([place]) 06/23/2010   Tendonitis, Achilles, left 06/15/2010    Orientation RESPIRATION BLADDER Height & Weight      (Disoriented)  Normal Continent Weight: 57.3 kg Height:  '5\' 3"'$  (160 cm)  BEHAVIORAL SYMPTOMS/MOOD NEUROLOGICAL BOWEL NUTRITION STATUS      Continent Diet (Heart Healthy)  AMBULATORY STATUS COMMUNICATION OF NEEDS Skin   Extensive Assist Verbally Normal                       Personal Care Assistance Level of Assistance  Bathing, Feeding, Dressing Bathing Assistance: Maximum assistance Feeding assistance: Limited assistance Dressing Assistance: Maximum assistance     Functional Limitations Info  Sight, Hearing, Speech Sight Info: Adequate Hearing Info: Adequate Speech Info: Adequate     SPECIAL CARE FACTORS FREQUENCY  PT (By licensed PT), OT (By licensed OT)     PT Frequency: Eval and Treat OT Frequency: Eval and Treat            Contractures Contractures Info: Not present    Additional Factors Info  Code Status, Allergies Code Status Info: FULL Allergies Info: Fosamax (Alendronate), Latex, Tetanus Toxoids, Vibra-tab (Doxycycline)           Current Medications (05/19/2022):  This is the current hospital active medication list Current Facility-Administered Medications  Medication Dose Route Frequency Provider Last Rate Last Admin   acetaminophen (TYLENOL) tablet 650 mg  650 mg Oral Q6H PRN Marylyn Ishihara, Tyrone A, DO   650 mg at 05/17/22 2148   Or   acetaminophen (TYLENOL) suppository 650 mg  650 mg Rectal Q6H PRN Marylyn Ishihara, Tyrone A, DO       albuterol (PROVENTIL) (2.5 MG/3ML) 0.083% nebulizer solution 2.5 mg  2.5 mg Nebulization Q6H PRN Marylyn Ishihara, Tyrone A, DO       escitalopram (LEXAPRO) tablet 10 mg  10 mg Oral Daily Kyle, Tyrone A, DO   10 mg at 05/19/22 1049   ipratropium (ATROVENT) 0.06 % nasal spray 1 spray  1 spray Each Nare TID Lorella Nimrod, MD   1 spray at 05/18/22 2217   melatonin tablet 3 mg  3 mg Oral QHS Kyle, Tyrone A, DO   3 mg at 05/18/22 2213   memantine (NAMENDA) tablet 10 mg  10 mg Oral BID Marylyn Ishihara, Tyrone A, DO   10 mg at 05/19/22 1049   multivitamin with minerals tablet 1 tablet  1 tablet Oral Daily Marylyn Ishihara, Tyrone A, DO   1 tablet at 05/19/22 1049   ondansetron (ZOFRAN) tablet 4 mg  4 mg Oral Q6H PRN Marylyn Ishihara, Tyrone A, DO       Or   ondansetron (ZOFRAN) injection 4 mg  4 mg Intravenous Q6H PRN Kyle, Tyrone A, DO       polyethylene glycol (MIRALAX / GLYCOLAX) packet 17 g  17 g Oral Daily Lorella Nimrod, MD   17 g at 05/19/22 1050   vitamin B-12 (CYANOCOBALAMIN) tablet 500 mcg  500 mcg Oral Daily Kyle, Tyrone A, DO   500 mcg at 05/19/22 1048     Discharge Medications: Please see discharge summary for a list of discharge medications.  Relevant Imaging  Results:  Relevant Lab Results:   Additional Information [id number]  Purcell Mouton, RN

## 2022-05-19 NOTE — Plan of Care (Signed)
  Problem: Health Behavior/Discharge Planning: Goal: Ability to manage health-related needs will improve Outcome: Progressing   Problem: Clinical Measurements: Goal: Ability to maintain clinical measurements within normal limits will improve Outcome: Progressing   Problem: Activity: Goal: Risk for activity intolerance will decrease Outcome: Progressing   Problem: Nutrition: Goal: Adequate nutrition will be maintained Outcome: Progressing   Problem: Elimination: Goal: Will not experience complications related to bowel motility Outcome: Not Progressing

## 2022-05-19 NOTE — Care Management Obs Status (Signed)
[place] NOTIFICATION   Patient Details  Name: Autumn Wallace MRN: [id number] Date of Birth: May 01, 1932   Medicare Observation Status Notification Given:  Yes    Purcell Mouton, RN 05/19/2022, 1:14 PM

## 2022-05-19 NOTE — Discharge Summary (Signed)
Physician Discharge Summary  Autumn Wallace ERX:[id number] DOB: 04/18/1932 DOA: 05/17/2022  PCP: Crist Infante, MD  Admit date: 05/17/2022 Discharge date: 05/19/2022  Admitted From: Memory care Disposition: Memory care  Recommendations for Outpatient Follow-up:  Follow up with PCP in 1-2 weeks Please obtain BMP/CBC in one week  Home Health: None Equipment/Devices: None Discharge Condition: Stable CODE STATUS: Full code Diet recommendation: Cardiac Brief/Interim Summary: Autumn Wallace is a 88 y.o. female with medical history significant of dementia, SVT, HLD, TIA. Presenting with lethargy. History is from daughter at bedside and is limited as the patient has dementia and lives in memory care. The daughter reports that the patient has foul smelling urine early last week. Urine was sent and it was noted that she had a UTI. She was started on abx 6 days ago. She started having diarrhea, but the facility noted that this was a normal side effect. It was noted that the patient became more lethargic over the last couple of days. This morning it was noted that she may have some right facial droop, so her facility sent her to the ED for evaluation.      She was found to have mild leukocytosis at 14.3, blood pressure at 86/65, found to be hypoxic requiring 2 to 3 L of oxygen, troponin negative, no other lab significant lab abnormality.  UA negative for UTI.  Procalcitonin negative.  Respiratory viral panel negative.  Blood cultures pending.  CT head and MRI brain was negative for any acute intracranial abnormality. CTA chest was negative for PE, did show moderate left and mild right pleural effusions with associated left greater than right lower lobe atelectasis.  Moderate to large hiatal hernia. Also noted a 36 x 29 mm low-density lesion in the right lobe of liver which is incompletely characterized.  And nonemergent MRI with and without contrast of abdomen was recommended.   Patient received a dose  of ceftriaxone in ED which was not continued as there is still no obvious infection.   6/13: Patient remained on 2 L of oxygen, no baseline oxygen use.  CTA with concern of bilateral pleural effusion, left greater than right.  Discussed with daughter and she does not want any thoracentesis.  On the day of discharge patient was 95% on room air.  Discharge Diagnoses:  Principal Problem:   AMS (altered mental status) Active Problems:   Acute hypoxic respiratory failure   Late onset Alzheimer's disease without behavioral disturbance (HCC)   Liver lesion, right lobe   PSVT (paroxysmal supraventricular tachycardia) (HCC)   #1 altered mental status secondary to mild acute hypoxia secondary to bilateral pleural effusion left greater than right.  Family did not want any aggressive intervention.  Unable to give diuresis due to soft blood pressure.  She was initially placed on 2 L of oxygen which was able to be tapered and was on room air prior to discharge.  Respiratory virus panel was negative.    #2 late onset Alzheimer's disease without behavioral disturbance patient has been pleasant during the entire hospital stay.  Continue Namenda.  #3 right lobe liver lesion will need a nonemergent liver MRI if family pursues.  #4 PSVT stable continue metoprolol  Estimated body mass index is 22.38 kg/m as calculated from the following:   Height as of this encounter: '5\' 3"'$  (1.6 m).   Weight as of this encounter: 57.3 kg.  Discharge Instructions  Discharge Instructions     Diet - low sodium heart healthy   Complete  by: As directed    Diet - low sodium heart healthy   Complete by: As directed    Increase activity slowly   Complete by: As directed    Increase activity slowly   Complete by: As directed       Allergies as of 05/19/2022       Reactions   Fosamax [alendronate] Other (See Comments)   Unknown reaction Listed as allergy on MAR   Latex Other (See Comments)   Unknown reaction Listed  as allergy on MAR   Tetanus Toxoids Other (See Comments)   Unknown reaction Listed as allergy on MAR   Vibra-tab [doxycycline] Other (See Comments)   Unknown reaction Listed as allergy on MAR        Medication List     STOP taking these medications    metoprolol tartrate 25 MG tablet Commonly known as: LOPRESSOR   nitrofurantoin (macrocrystal-monohydrate) 100 MG capsule Commonly known as: MACROBID       TAKE these medications    acetaminophen 325 MG tablet Commonly known as: Tylenol Take 2 tablets (650 mg total) by mouth every 6 (six) hours as needed for up to 30 doses for moderate pain or mild pain. What changed: when to take this   CENTRUM SILVER 50+WOMEN PO Take 1 tablet by mouth daily.   cholecalciferol 25 MCG (1000 UNIT) tablet Commonly known as: VITAMIN D3 Take 2,000 Units by mouth daily.   CRANBERRY PLUS VITAMIN C PO Take 1 capsule by mouth daily. Cranberry 4200 mg with Vitamin C (unknown strength)   docusate sodium 100 MG capsule Commonly known as: COLACE Take 200 mg by mouth daily.   Ensure Take 237 mLs by mouth daily.   escitalopram 10 MG tablet Commonly known as: LEXAPRO Take 10 mg by mouth daily.   ipratropium 0.06 % nasal spray Commonly known as: ATROVENT Place 1 spray into both nostrils 3 (three) times daily.   memantine 10 MG tablet Commonly known as: NAMENDA Take 10 mg by mouth 2 (two) times daily.   polyethylene glycol powder 17 GM/SCOOP powder Commonly known as: GLYCOLAX/MIRALAX Take 17 g by mouth daily.   vitamin B-12 500 MCG tablet Commonly known as: CYANOCOBALAMIN Take 500 mcg by mouth daily.        Allergies  Allergen Reactions   Fosamax [Alendronate] Other (See Comments)    Unknown reaction Listed as allergy on MAR   Latex Other (See Comments)    Unknown reaction Listed as allergy on MAR   Tetanus Toxoids Other (See Comments)    Unknown reaction Listed as allergy on MAR   Vibra-Tab [Doxycycline] Other (See  Comments)    Unknown reaction Listed as allergy on MAR    Consultations: none   Procedures/Studies: MR BRAIN WO CONTRAST  Result Date: 05/17/2022 CLINICAL DATA:  Transient ischemic attack (TIA) EXAM: MRI HEAD WITHOUT CONTRAST TECHNIQUE: Multiplanar, multiecho pulse sequences of the brain and surrounding structures were obtained without intravenous contrast. COMPARISON:  CT head from the same day. FINDINGS: Brain: No acute infarction, hemorrhage, hydrocephalus, extra-axial collection or mass lesion. Cerebral atrophy. Vascular: Major arterial flow voids are maintained skull base. Skull and upper cervical spine: Normal marrow signal. Sinuses/Orbits: Clear sinuses.  No acute orbital findings. Other: No mastoid effusions. IMPRESSION: No evidence of acute intracranial abnormality. Electronically Signed   By: Margaretha Sheffield M.D.   On: 05/17/2022 16:14   CT Angio Chest PE W and/or Wo Contrast  Result Date: 05/17/2022 CLINICAL DATA:  PULMONARY EMBOLISM SUSPECTED, HIGH PROBABILITY.  PATIENT IS UNDERGOING TREATMENT FOR URINARY TRACT INFECTION. EXAM: CT ANGIOGRAPHY CHEST WITH CONTRAST TECHNIQUE: Multidetector CT imaging of the chest was performed using the standard protocol during bolus administration of intravenous contrast. Multiplanar CT image reconstructions and MIPs were obtained to evaluate the vascular anatomy. RADIATION DOSE REDUCTION: This exam was performed according to the departmental dose-optimization program which includes automated exposure control, adjustment of the mA and/or kV according to patient size and/or use of iterative reconstruction technique. CONTRAST:  20m OMNIPAQUE IOHEXOL 350 MG/ML SOLN COMPARISON:  Two-view chest x-ray 05/17/2022. FINDINGS: Cardiovascular: Heart is upper limits of normal for size. Atherosclerotic calcifications are present the aortic arch, great vessel origins and descending thoracic aorta without focal aneurysm or stenosis. Pulmonary artery opacification is  excellent. No focal filling defects are present to suggest pulmonary emboli. Mediastinum/Nodes: Moderate to large hiatal hernia is present. No significant mediastinal, hilar, or axillary adenopathy is present. Esophagus is otherwise normal. Thoracic inlet is within normal limits. Lungs/Pleura: Moderate left and mild right pleural effusion is present. Left greater than right lower lobe atelectasis is associated. Some dependent ground-glass attenuation is present the upper lobes bilaterally. No other nodule or mass lesion is present. No other significant airspace consolidation is present. Upper Abdomen: Low density lesion in the right lobe of the liver on image 104 of series 4 measures 36 x 29 mm. Punctate calcification also present within the right lobe. No other solid organ lesions are present. Musculoskeletal: No chest wall abnormality. No acute or significant osseous findings. Review of the MIP images confirms the above findings. IMPRESSION: 1. No pulmonary embolus. 2. Moderate left and mild right pleural effusions with associated left greater than right lower lobe atelectasis. 3. Dependent ground-glass attenuation in the upper lobes bilaterally is likely related to atelectasis. 4. Moderate to large hiatal hernia. 5. 36 x 29 mm low density lesion in the right lobe of the liver is incompletely characterized. Recommend nonemergent MRI of the abdomen without and with contrast for further evaluation. 6. Aortic Atherosclerosis (ICD10-I70.0). Electronically Signed   By: CSan MorelleM.D.   On: 05/17/2022 11:36   DG Chest 2 View  Result Date: 05/17/2022 CLINICAL DATA:  Dyspnea EXAM: CHEST - 2 VIEW COMPARISON:  March 05, 2022 FINDINGS: The heart size and mediastinal contours are stable. The heart size is probably mildly enlarged. Patchy consolidation of bilateral lung bases are identified. Small bilateral pleural effusions are noted. Mild increased pulmonary interstitium is identified bilaterally. The  visualized skeletal structures are stable. IMPRESSION: 1. Mild interstitial edema. 2. Patchy consolidation of bilateral lung bases, pneumonia is not excluded. 3. Small bilateral pleural effusions. Electronically Signed   By: WAbelardo DieselM.D.   On: 05/17/2022 10:19   CT Head Wo Contrast  Result Date: 05/17/2022 CLINICAL DATA:  Neuro deficit, acute, stroke suspected EXAM: CT HEAD WITHOUT CONTRAST TECHNIQUE: Contiguous axial images were obtained from the base of the skull through the vertex without intravenous contrast. RADIATION DOSE REDUCTION: This exam was performed according to the departmental dose-optimization program which includes automated exposure control, adjustment of the mA and/or kV according to patient size and/or use of iterative reconstruction technique. COMPARISON:  CT head March 05, 2022. FINDINGS: Brain: No evidence of acute large vascular territory infarction, hemorrhage, hydrocephalus, extra-axial collection or mass lesion/mass effect. Mild for age patchy white matter hypodensities, nonspecific but compatible with chronic microvascular ischemic disease. Mild for age cerebral atrophy. Vascular: No hyperdense vessel identified. Calcific intracranial atherosclerosis. Skull: No acute fracture. Sinuses/Orbits: Clear sinuses.  No acute  orbital findings. Other: No mastoid effusions. IMPRESSION: No evidence of acute intracranial abnormality. Electronically Signed   By: Margaretha Sheffield M.D.   On: 05/17/2022 10:11   (Echo, Carotid, EGD, Colonoscopy, ERCP)    Subjective: Patient is resting in bed in no acute distress  Discharge Exam: Vitals:   05/19/22 0320 05/19/22 1258  BP: 101/60 (!) 105/50  Pulse:  63  Resp:    Temp:  97.9 F (36.6 C)  SpO2:  96%   Vitals:   05/19/22 0315 05/19/22 0316 05/19/22 0320 05/19/22 1258  BP: (!) (P) 95/48 (!) 95/48 101/60 (!) 105/50  Pulse:  64  63  Resp: 20 16    Temp: (P) 98 F (36.7 C) 98 F (36.7 C)  97.9 F (36.6 C)  TempSrc: (P) Axillary    Oral  SpO2:  97%  96%  Weight:      Height:        General: Pt is alert, awake, not in acute distress Cardiovascular: RRR, S1/S2 +, no rubs, no gallops Respiratory: Diminished breath sounds bilaterally, no wheezing, no rhonchi Abdominal: Soft, NT, ND, bowel sounds + Extremities: Trace edema  The results of significant diagnostics from this hospitalization (including imaging, microbiology, ancillary and laboratory) are listed below for reference.     Microbiology: Recent Results (from the past 240 hour(s))  Blood culture (routine x 2)     Status: None (Preliminary result)   Collection Time: 05/17/22 12:10 PM   Specimen: BLOOD  Result Value Ref Range Status   Specimen Description   Final    BLOOD BLOOD LEFT FOREARM Performed at [facility] [address], [place], [place] [address]    Special Requests   Final    BOTTLES DRAWN AEROBIC ONLY Blood Culture adequate volume Performed at [facility] [address], [place], [place] [address]    Culture   Final    NO GROWTH 2 DAYS Performed at [facility] [address], [place], [place] [address]    Report Status PENDING  Incomplete  Blood culture (routine x 2)     Status: None (Preliminary result)   Collection Time: 05/17/22 12:11 PM   Specimen: BLOOD  Result Value Ref Range Status   Specimen Description   Final    BLOOD LEFT ANTECUBITAL Performed at [facility] [address], [place], [place] [address]    Special Requests   Final    BOTTLES DRAWN AEROBIC AND ANAEROBIC Blood Culture adequate volume Performed at [facility] [address], [place], [place] [address]    Culture   Final    NO GROWTH 2 DAYS Performed at [facility] [address] [place], [place] [address]    Report Status PENDING  Incomplete  Respiratory (~20 pathogens) panel by PCR     Status: None   Collection Time: 05/17/22  2:52 PM   Specimen:  Nasopharyngeal Swab; Respiratory  Result Value Ref Range Status   Adenovirus NOT DETECTED NOT DETECTED Final   Coronavirus 229E NOT DETECTED NOT DETECTED Final    Comment: (NOTE) The Coronavirus on the Respiratory Panel, DOES NOT test for the novel  Coronavirus (2019 nCoV)    Coronavirus HKU1 NOT DETECTED NOT DETECTED Final   Coronavirus NL63 NOT DETECTED NOT DETECTED Final   Coronavirus OC43 NOT DETECTED NOT DETECTED Final   Metapneumovirus NOT DETECTED NOT DETECTED Final   Rhinovirus / Enterovirus NOT DETECTED NOT DETECTED Final   Influenza  A NOT DETECTED NOT DETECTED Final   Influenza B NOT DETECTED NOT DETECTED Final   Parainfluenza Virus 1 NOT DETECTED NOT DETECTED Final   Parainfluenza Virus 2 NOT DETECTED NOT DETECTED Final   Parainfluenza Virus 3 NOT DETECTED NOT DETECTED Final   Parainfluenza Virus 4 NOT DETECTED NOT DETECTED Final   Respiratory Syncytial Virus NOT DETECTED NOT DETECTED Final   Bordetella pertussis NOT DETECTED NOT DETECTED Final   Bordetella Parapertussis NOT DETECTED NOT DETECTED Final   Chlamydophila pneumoniae NOT DETECTED NOT DETECTED Final   Mycoplasma pneumoniae NOT DETECTED NOT DETECTED Final    Comment: Performed at [facility], [place] [address] [place], [place] [address]     Labs: BNP (last 3 results) Recent Labs    05/17/22 1220  BNP 681.2*   Basic Metabolic Panel: Recent Labs  Lab 05/17/22 0858 05/18/22 0356  NA 136 139  K 3.8 4.2  CL 101 105  CO2 27 26  GLUCOSE 119* 91  BUN 17 19  CREATININE 0.91 0.92  CALCIUM 9.1 9.0   Liver Function Tests: Recent Labs  Lab 05/17/22 0858 05/18/22 0356  AST 23 18  ALT 23 17  ALKPHOS 91 86  BILITOT 0.8 0.8  PROT 7.0 5.9*  ALBUMIN 2.9* 2.6*   No results for input(s): "LIPASE", "AMYLASE" in the last 168 hours. No results for input(s): "AMMONIA" in the last 168 hours. CBC: Recent Labs  Lab 05/17/22 0858 05/18/22 0356  WBC 14.3* 11.4*  NEUTROABS 12.1*  --   HGB 12.9 11.6*   HCT 39.4 35.8*  MCV 90.2 92.0  PLT 364 292   Cardiac Enzymes: No results for input(s): "CKTOTAL", "CKMB", "CKMBINDEX", "TROPONINI" in the last 168 hours. BNP: Invalid input(s): "POCBNP" CBG: No results for input(s): "GLUCAP" in the last 168 hours. D-Dimer No results for input(s): "DDIMER" in the last 72 hours. Hgb A1c No results for input(s): "HGBA1C" in the last 72 hours. Lipid Profile No results for input(s): "CHOL", "HDL", "LDLCALC", "TRIG", "CHOLHDL", "LDLDIRECT" in the last 72 hours. Thyroid function studies No results for input(s): "TSH", "T4TOTAL", "T3FREE", "THYROIDAB" in the last 72 hours.  Invalid input(s): "FREET3" Anemia work up No results for input(s): "VITAMINB12", "FOLATE", "FERRITIN", "TIBC", "IRON", "RETICCTPCT" in the last 72 hours. Urinalysis    Component Value Date/Time   COLORURINE YELLOW 05/17/2022 1050   APPEARANCEUR HAZY (A) 05/17/2022 1050   LABSPEC 1.011 05/17/2022 1050   PHURINE 6.0 05/17/2022 1050   GLUCOSEU NEGATIVE 05/17/2022 1050   HGBUR NEGATIVE 05/17/2022 [place] 05/17/2022 1050   KETONESUR NEGATIVE 05/17/2022 1050   PROTEINUR NEGATIVE 05/17/2022 1050   NITRITE NEGATIVE 05/17/2022 [place] 05/17/2022 1050   Sepsis Labs Recent Labs  Lab 05/17/22 0858 05/18/22 0356  WBC 14.3* 11.4*   Microbiology Recent Results (from the past 240 hour(s))  Blood culture (routine x 2)     Status: None (Preliminary result)   Collection Time: 05/17/22 12:10 PM   Specimen: BLOOD  Result Value Ref Range Status   Specimen Description   Final    BLOOD BLOOD LEFT FOREARM Performed at [facility], [place] [address] [place], [place] [address]    Special Requests   Final    BOTTLES DRAWN AEROBIC ONLY Blood Culture adequate volume Performed at [facility] [address] [place], [place] [address]    Culture   Final    NO GROWTH 2 DAYS Performed at [facility],  [address] [place], [place] [address]    Report Status PENDING  Incomplete  Blood culture (routine x 2)     Status: None (Preliminary result)   Collection Time: 05/17/22 12:11 PM   Specimen: BLOOD  Result Value Ref Range Status   Specimen Description   Final    BLOOD LEFT ANTECUBITAL Performed at [facility] [address], [place], [place] [address]    Special Requests   Final    BOTTLES DRAWN AEROBIC AND ANAEROBIC Blood Culture adequate volume Performed at [facility] [address], [place], [place] [address]    Culture   Final    NO GROWTH 2 DAYS Performed at [facility] [address], [place], [place] [address]    Report Status PENDING  Incomplete  Respiratory (~20 pathogens) panel by PCR     Status: None   Collection Time: 05/17/22  2:52 PM   Specimen: Nasopharyngeal Swab; Respiratory  Result Value Ref Range Status   Adenovirus NOT DETECTED NOT DETECTED Final   Coronavirus 229E NOT DETECTED NOT DETECTED Final    Comment: (NOTE) The Coronavirus on the Respiratory Panel, DOES NOT test for the novel  Coronavirus (2019 nCoV)    Coronavirus HKU1 NOT DETECTED NOT DETECTED Final   Coronavirus NL63 NOT DETECTED NOT DETECTED Final   Coronavirus OC43 NOT DETECTED NOT DETECTED Final   Metapneumovirus NOT DETECTED NOT DETECTED Final   Rhinovirus / Enterovirus NOT DETECTED NOT DETECTED Final   Influenza A NOT DETECTED NOT DETECTED Final   Influenza B NOT DETECTED NOT DETECTED Final   Parainfluenza Virus 1 NOT DETECTED NOT DETECTED Final   Parainfluenza Virus 2 NOT DETECTED NOT DETECTED Final   Parainfluenza Virus 3 NOT DETECTED NOT DETECTED Final   Parainfluenza Virus 4 NOT DETECTED NOT DETECTED Final   Respiratory Syncytial Virus NOT DETECTED NOT DETECTED Final   Bordetella pertussis NOT DETECTED NOT DETECTED Final   Bordetella Parapertussis NOT DETECTED NOT DETECTED Final   Chlamydophila pneumoniae NOT DETECTED NOT DETECTED  Final   Mycoplasma pneumoniae NOT DETECTED NOT DETECTED Final    Comment: Performed at [facility], [place]. [address], [place],  [address]     Time coordinating discharge: 35 minutes  SIGNED:   Georgette Shell, MD  [facility] 05/19/2022, 1:13 PM

## 2022-05-22 LAB — CULTURE, BLOOD (ROUTINE X 2)
Culture: NO GROWTH
Culture: NO GROWTH
Special Requests: ADEQUATE
Special Requests: ADEQUATE

## 2022-05-25 DIAGNOSIS — J309 Allergic rhinitis, unspecified: Secondary | ICD-10-CM | POA: Diagnosis not present

## 2022-05-25 DIAGNOSIS — R4182 Altered mental status, unspecified: Secondary | ICD-10-CM | POA: Diagnosis not present

## 2022-05-25 DIAGNOSIS — K449 Diaphragmatic hernia without obstruction or gangrene: Secondary | ICD-10-CM | POA: Diagnosis not present

## 2022-05-25 DIAGNOSIS — J90 Pleural effusion, not elsewhere classified: Secondary | ICD-10-CM | POA: Diagnosis not present

## 2022-05-25 DIAGNOSIS — I129 Hypertensive chronic kidney disease with stage 1 through stage 4 chronic kidney disease, or unspecified chronic kidney disease: Secondary | ICD-10-CM | POA: Diagnosis not present

## 2022-05-25 DIAGNOSIS — K5900 Constipation, unspecified: Secondary | ICD-10-CM | POA: Diagnosis not present

## 2022-05-25 DIAGNOSIS — F03C0 Unspecified dementia, severe, without behavioral disturbance, psychotic disturbance, mood disturbance, and anxiety: Secondary | ICD-10-CM | POA: Diagnosis not present

## 2022-05-25 DIAGNOSIS — K769 Liver disease, unspecified: Secondary | ICD-10-CM | POA: Diagnosis not present

## 2022-05-25 DIAGNOSIS — I471 Supraventricular tachycardia: Secondary | ICD-10-CM | POA: Diagnosis not present

## 2022-06-18 DIAGNOSIS — R2681 Unsteadiness on feet: Secondary | ICD-10-CM | POA: Diagnosis not present

## 2022-06-18 DIAGNOSIS — M6281 Muscle weakness (generalized): Secondary | ICD-10-CM | POA: Diagnosis not present

## 2022-06-18 DIAGNOSIS — R296 Repeated falls: Secondary | ICD-10-CM | POA: Diagnosis not present

## 2022-06-21 DIAGNOSIS — R296 Repeated falls: Secondary | ICD-10-CM | POA: Diagnosis not present

## 2022-06-21 DIAGNOSIS — R2681 Unsteadiness on feet: Secondary | ICD-10-CM | POA: Diagnosis not present

## 2022-06-21 DIAGNOSIS — M6281 Muscle weakness (generalized): Secondary | ICD-10-CM | POA: Diagnosis not present

## 2022-06-24 ENCOUNTER — Other Ambulatory Visit: Payer: Self-pay

## 2022-06-24 DIAGNOSIS — M810 Age-related osteoporosis without current pathological fracture: Secondary | ICD-10-CM | POA: Insufficient documentation

## 2022-06-28 DIAGNOSIS — R2681 Unsteadiness on feet: Secondary | ICD-10-CM | POA: Diagnosis not present

## 2022-06-28 DIAGNOSIS — R296 Repeated falls: Secondary | ICD-10-CM | POA: Diagnosis not present

## 2022-06-28 DIAGNOSIS — M6281 Muscle weakness (generalized): Secondary | ICD-10-CM | POA: Diagnosis not present

## 2022-06-30 DIAGNOSIS — M6281 Muscle weakness (generalized): Secondary | ICD-10-CM | POA: Diagnosis not present

## 2022-06-30 DIAGNOSIS — R2681 Unsteadiness on feet: Secondary | ICD-10-CM | POA: Diagnosis not present

## 2022-06-30 DIAGNOSIS — R296 Repeated falls: Secondary | ICD-10-CM | POA: Diagnosis not present

## 2022-07-01 ENCOUNTER — Ambulatory Visit (INDEPENDENT_AMBULATORY_CARE_PROVIDER_SITE_OTHER): Payer: Medicare PPO | Admitting: *Deleted

## 2022-07-01 VITALS — BP 130/77 | HR 71 | Temp 97.4°F | Resp 16 | Ht 64.0 in | Wt 128.6 lb

## 2022-07-01 DIAGNOSIS — M810 Age-related osteoporosis without current pathological fracture: Secondary | ICD-10-CM | POA: Diagnosis not present

## 2022-07-01 MED ORDER — DENOSUMAB 60 MG/ML ~~LOC~~ SOSY
60.0000 mg | PREFILLED_SYRINGE | Freq: Once | SUBCUTANEOUS | Status: AC
  Administered 2022-07-01: 60 mg via SUBCUTANEOUS
  Filled 2022-07-01: qty 1

## 2022-07-01 NOTE — Progress Notes (Signed)
Diagnosis: Osteoporosis  Provider:  Marshell Garfinkel, MD  Procedure: Injection  Prolia (Denosumab), Dose: 60 mg, Site: subcutaneous, Number of injections: 1  Discharge: Condition: Good, Destination: Home . AVS provided to patient.   Performed by:  Oren Beckmann, RN

## 2022-07-06 DIAGNOSIS — R296 Repeated falls: Secondary | ICD-10-CM | POA: Diagnosis not present

## 2022-07-06 DIAGNOSIS — R2681 Unsteadiness on feet: Secondary | ICD-10-CM | POA: Diagnosis not present

## 2022-07-06 DIAGNOSIS — N390 Urinary tract infection, site not specified: Secondary | ICD-10-CM | POA: Diagnosis not present

## 2022-07-06 DIAGNOSIS — M6281 Muscle weakness (generalized): Secondary | ICD-10-CM | POA: Diagnosis not present

## 2022-07-07 DIAGNOSIS — R3915 Urgency of urination: Secondary | ICD-10-CM | POA: Diagnosis not present

## 2022-07-13 DIAGNOSIS — R296 Repeated falls: Secondary | ICD-10-CM | POA: Diagnosis not present

## 2022-07-13 DIAGNOSIS — R2681 Unsteadiness on feet: Secondary | ICD-10-CM | POA: Diagnosis not present

## 2022-07-13 DIAGNOSIS — M6281 Muscle weakness (generalized): Secondary | ICD-10-CM | POA: Diagnosis not present

## 2022-07-14 DIAGNOSIS — H0100A Unspecified blepharitis right eye, upper and lower eyelids: Secondary | ICD-10-CM | POA: Diagnosis not present

## 2022-07-14 DIAGNOSIS — R2681 Unsteadiness on feet: Secondary | ICD-10-CM | POA: Diagnosis not present

## 2022-07-14 DIAGNOSIS — H0100B Unspecified blepharitis left eye, upper and lower eyelids: Secondary | ICD-10-CM | POA: Diagnosis not present

## 2022-07-14 DIAGNOSIS — M6281 Muscle weakness (generalized): Secondary | ICD-10-CM | POA: Diagnosis not present

## 2022-07-14 DIAGNOSIS — R296 Repeated falls: Secondary | ICD-10-CM | POA: Diagnosis not present

## 2022-08-04 DIAGNOSIS — N390 Urinary tract infection, site not specified: Secondary | ICD-10-CM | POA: Diagnosis not present

## 2022-08-10 DIAGNOSIS — L821 Other seborrheic keratosis: Secondary | ICD-10-CM | POA: Diagnosis not present

## 2022-08-10 DIAGNOSIS — L570 Actinic keratosis: Secondary | ICD-10-CM | POA: Diagnosis not present

## 2022-08-10 DIAGNOSIS — D692 Other nonthrombocytopenic purpura: Secondary | ICD-10-CM | POA: Diagnosis not present

## 2022-08-10 DIAGNOSIS — D1801 Hemangioma of skin and subcutaneous tissue: Secondary | ICD-10-CM | POA: Diagnosis not present

## 2022-09-29 DIAGNOSIS — R399 Unspecified symptoms and signs involving the genitourinary system: Secondary | ICD-10-CM | POA: Diagnosis not present

## 2022-10-10 ENCOUNTER — Emergency Department (HOSPITAL_BASED_OUTPATIENT_CLINIC_OR_DEPARTMENT_OTHER): Payer: Medicare PPO | Admitting: Radiology

## 2022-10-10 ENCOUNTER — Emergency Department (HOSPITAL_BASED_OUTPATIENT_CLINIC_OR_DEPARTMENT_OTHER): Payer: Medicare PPO

## 2022-10-10 ENCOUNTER — Encounter (HOSPITAL_BASED_OUTPATIENT_CLINIC_OR_DEPARTMENT_OTHER): Payer: Self-pay

## 2022-10-10 ENCOUNTER — Emergency Department (HOSPITAL_BASED_OUTPATIENT_CLINIC_OR_DEPARTMENT_OTHER)
Admission: EM | Admit: 2022-10-10 | Discharge: 2022-10-10 | Disposition: A | Discharge status: Home / Self Care | Payer: Medicare PPO | Source: Home / Self Care | Attending: Emergency Medicine | Admitting: Emergency Medicine

## 2022-10-10 DIAGNOSIS — I4719 Other supraventricular tachycardia: Secondary | ICD-10-CM | POA: Diagnosis present

## 2022-10-10 DIAGNOSIS — M25562 Pain in left knee: Secondary | ICD-10-CM | POA: Diagnosis not present

## 2022-10-10 DIAGNOSIS — E440 Moderate protein-calorie malnutrition: Secondary | ICD-10-CM | POA: Diagnosis present

## 2022-10-10 DIAGNOSIS — M79652 Pain in left thigh: Secondary | ICD-10-CM | POA: Diagnosis not present

## 2022-10-10 DIAGNOSIS — W1830XA Fall on same level, unspecified, initial encounter: Secondary | ICD-10-CM | POA: Diagnosis present

## 2022-10-10 DIAGNOSIS — S32591D Other specified fracture of right pubis, subsequent encounter for fracture with routine healing: Secondary | ICD-10-CM | POA: Diagnosis not present

## 2022-10-10 DIAGNOSIS — E785 Hyperlipidemia, unspecified: Secondary | ICD-10-CM | POA: Diagnosis present

## 2022-10-10 DIAGNOSIS — R102 Pelvic and perineal pain: Secondary | ICD-10-CM | POA: Diagnosis not present

## 2022-10-10 DIAGNOSIS — Z8673 Personal history of transient ischemic attack (TIA), and cerebral infarction without residual deficits: Secondary | ICD-10-CM | POA: Diagnosis not present

## 2022-10-10 DIAGNOSIS — Z887 Allergy status to serum and vaccine status: Secondary | ICD-10-CM | POA: Diagnosis not present

## 2022-10-10 DIAGNOSIS — I2721 Secondary pulmonary arterial hypertension: Secondary | ICD-10-CM | POA: Diagnosis not present

## 2022-10-10 DIAGNOSIS — F0280 Dementia in other diseases classified elsewhere without behavioral disturbance: Secondary | ICD-10-CM | POA: Diagnosis not present

## 2022-10-10 DIAGNOSIS — I959 Hypotension, unspecified: Secondary | ICD-10-CM | POA: Diagnosis not present

## 2022-10-10 DIAGNOSIS — G301 Alzheimer's disease with late onset: Secondary | ICD-10-CM | POA: Diagnosis present

## 2022-10-10 DIAGNOSIS — Y92129 Unspecified place in nursing home as the place of occurrence of the external cause: Secondary | ICD-10-CM | POA: Insufficient documentation

## 2022-10-10 DIAGNOSIS — S0990XA Unspecified injury of head, initial encounter: Secondary | ICD-10-CM | POA: Insufficient documentation

## 2022-10-10 DIAGNOSIS — I272 Pulmonary hypertension, unspecified: Secondary | ICD-10-CM | POA: Diagnosis not present

## 2022-10-10 DIAGNOSIS — Z888 Allergy status to other drugs, medicaments and biological substances status: Secondary | ICD-10-CM | POA: Diagnosis not present

## 2022-10-10 DIAGNOSIS — Z79899 Other long term (current) drug therapy: Secondary | ICD-10-CM | POA: Diagnosis not present

## 2022-10-10 DIAGNOSIS — Z881 Allergy status to other antibiotic agents status: Secondary | ICD-10-CM | POA: Diagnosis not present

## 2022-10-10 DIAGNOSIS — Z66 Do not resuscitate: Secondary | ICD-10-CM | POA: Diagnosis not present

## 2022-10-10 DIAGNOSIS — Y9301 Activity, walking, marching and hiking: Secondary | ICD-10-CM | POA: Diagnosis present

## 2022-10-10 DIAGNOSIS — Y92128 Other place in nursing home as the place of occurrence of the external cause: Secondary | ICD-10-CM | POA: Diagnosis not present

## 2022-10-10 DIAGNOSIS — D72829 Elevated white blood cell count, unspecified: Secondary | ICD-10-CM | POA: Diagnosis present

## 2022-10-10 DIAGNOSIS — Z8719 Personal history of other diseases of the digestive system: Secondary | ICD-10-CM | POA: Diagnosis not present

## 2022-10-10 DIAGNOSIS — F0392 Unspecified dementia, unspecified severity, with psychotic disturbance: Secondary | ICD-10-CM | POA: Diagnosis not present

## 2022-10-10 DIAGNOSIS — F0390 Unspecified dementia without behavioral disturbance: Secondary | ICD-10-CM | POA: Diagnosis not present

## 2022-10-10 DIAGNOSIS — Z86010 Personal history of colonic polyps: Secondary | ICD-10-CM | POA: Diagnosis not present

## 2022-10-10 DIAGNOSIS — Z96642 Presence of left artificial hip joint: Secondary | ICD-10-CM | POA: Diagnosis not present

## 2022-10-10 DIAGNOSIS — W19XXXA Unspecified fall, initial encounter: Secondary | ICD-10-CM | POA: Diagnosis not present

## 2022-10-10 DIAGNOSIS — Z803 Family history of malignant neoplasm of breast: Secondary | ICD-10-CM | POA: Diagnosis not present

## 2022-10-10 DIAGNOSIS — R296 Repeated falls: Secondary | ICD-10-CM | POA: Diagnosis present

## 2022-10-10 DIAGNOSIS — R799 Abnormal finding of blood chemistry, unspecified: Secondary | ICD-10-CM | POA: Insufficient documentation

## 2022-10-10 DIAGNOSIS — K449 Diaphragmatic hernia without obstruction or gangrene: Secondary | ICD-10-CM | POA: Diagnosis not present

## 2022-10-10 DIAGNOSIS — S51012A Laceration without foreign body of left elbow, initial encounter: Secondary | ICD-10-CM | POA: Diagnosis present

## 2022-10-10 DIAGNOSIS — M8580 Other specified disorders of bone density and structure, unspecified site: Secondary | ICD-10-CM | POA: Diagnosis present

## 2022-10-10 DIAGNOSIS — Z91040 Latex allergy status: Secondary | ICD-10-CM | POA: Diagnosis not present

## 2022-10-10 DIAGNOSIS — R0902 Hypoxemia: Secondary | ICD-10-CM | POA: Diagnosis not present

## 2022-10-10 DIAGNOSIS — Z7401 Bed confinement status: Secondary | ICD-10-CM | POA: Diagnosis not present

## 2022-10-10 DIAGNOSIS — S72012A Unspecified intracapsular fracture of left femur, initial encounter for closed fracture: Secondary | ICD-10-CM | POA: Diagnosis present

## 2022-10-10 DIAGNOSIS — Z471 Aftercare following joint replacement surgery: Secondary | ICD-10-CM | POA: Diagnosis not present

## 2022-10-10 DIAGNOSIS — Z043 Encounter for examination and observation following other accident: Secondary | ICD-10-CM | POA: Diagnosis not present

## 2022-10-10 DIAGNOSIS — S72002A Fracture of unspecified part of neck of left femur, initial encounter for closed fracture: Secondary | ICD-10-CM | POA: Diagnosis not present

## 2022-10-10 DIAGNOSIS — M542 Cervicalgia: Secondary | ICD-10-CM | POA: Diagnosis not present

## 2022-10-10 DIAGNOSIS — W010XXA Fall on same level from slipping, tripping and stumbling without subsequent striking against object, initial encounter: Secondary | ICD-10-CM | POA: Diagnosis not present

## 2022-10-10 DIAGNOSIS — S199XXA Unspecified injury of neck, initial encounter: Secondary | ICD-10-CM | POA: Diagnosis not present

## 2022-10-10 DIAGNOSIS — Z9049 Acquired absence of other specified parts of digestive tract: Secondary | ICD-10-CM | POA: Diagnosis not present

## 2022-10-10 DIAGNOSIS — K5730 Diverticulosis of large intestine without perforation or abscess without bleeding: Secondary | ICD-10-CM | POA: Diagnosis not present

## 2022-10-10 DIAGNOSIS — S80212A Abrasion, left knee, initial encounter: Secondary | ICD-10-CM | POA: Diagnosis present

## 2022-10-10 DIAGNOSIS — Z6821 Body mass index (BMI) 21.0-21.9, adult: Secondary | ICD-10-CM | POA: Diagnosis not present

## 2022-10-10 DIAGNOSIS — F02C0 Dementia in other diseases classified elsewhere, severe, without behavioral disturbance, psychotic disturbance, mood disturbance, and anxiety: Secondary | ICD-10-CM | POA: Diagnosis present

## 2022-10-10 DIAGNOSIS — Z515 Encounter for palliative care: Secondary | ICD-10-CM | POA: Diagnosis not present

## 2022-10-10 DIAGNOSIS — I10 Essential (primary) hypertension: Secondary | ICD-10-CM | POA: Diagnosis not present

## 2022-10-10 DIAGNOSIS — S72001A Fracture of unspecified part of neck of right femur, initial encounter for closed fracture: Secondary | ICD-10-CM | POA: Diagnosis present

## 2022-10-10 LAB — CBG MONITORING, ED: Glucose-Capillary: 88 mg/dL (ref 70–99)

## 2022-10-10 NOTE — ED Notes (Signed)
Discharge instructions and follow up reviewed and discussed with pt and her daughter/caregiver. Pt's daughter states pt is alert at baseline mental status (hx dementia) and ambulates at baseline at present. Pt was taken to daughter POV via wheelchair without incident.

## 2022-10-10 NOTE — ED Provider Notes (Signed)
[facility] EMERGENCY DEPT Provider Note   CSN: [id number] Arrival date & time: 10/10/22  0845     History  Chief Complaint  Patient presents with   Unwitnesses fall    Autumn Wallace is a 88 y.o. female with a significant past medical history significant for dementia, SVT, HLD, TIA presenting to the ED after an unwitnessed fall  Patient is alert and not oriented to self, which is her baseline.  Her daughter is at bedside.  Patient resides at memory care unit where she was at her usual eating breakfast.  Per daughter, nursing home staff were tending to other residents, her mother got up from breakfast, she was not on the line of sight, and big thump was heard from the breakfast room.  Patient was found laying down.  She did not complain of chest pain, shortness of breath, head pain, abdominal pain, or difficulty walking.  She was complaining of some neck discomfort.  Patient has not had changes in activity, appetite, color, consistency, or frequency in urination or bowel movements.  Daughter reports she is a fast walker and can initially be unstable when getting up from sitting, but usually walks without assistive devices.  They have been troubled with getting enough assistance or supervision of her mother at the current nursing home, which has been a problem at times since patient is disoriented and has difficulty following commands.    The history is provided by medical records, the nursing home, the patient, the EMS personnel and a relative. The history is limited by the condition of the patient. No language interpreter was used.       Home Medications Prior to Admission medications   Medication Sig Start Date End Date Taking? Authorizing Provider  acetaminophen (TYLENOL) 325 MG tablet Take 2 tablets (650 mg total) by mouth every 6 (six) hours as needed for up to 30 doses for moderate pain or mild pain. Patient taking differently: Take 650 mg by mouth every 8 (eight)  hours as needed for moderate pain or mild pain. 03/05/22   Wyvonnia Dusky, MD  cholecalciferol (VITAMIN D3) 25 MCG (1000 UNIT) tablet Take 2,000 Units by mouth daily.    [provider]  Cranberry-Vitamin C-Vitamin E (CRANBERRY PLUS VITAMIN C PO) Take 1 capsule by mouth daily. Cranberry 4200 mg with Vitamin C (unknown strength)    [provider]  docusate sodium (COLACE) 100 MG capsule Take 200 mg by mouth daily.    [provider]  Ensure (ENSURE) Take 237 mLs by mouth daily.    [provider]  escitalopram (LEXAPRO) 10 MG tablet Take 10 mg by mouth daily. 08/12/19   [provider]  ipratropium (ATROVENT) 0.06 % nasal spray Place 1 spray into both nostrils 3 (three) times daily.    [provider]  memantine (NAMENDA) 10 MG tablet Take 10 mg by mouth 2 (two) times daily. 08/07/19   [provider]  Multiple Vitamins-Minerals (CENTRUM SILVER 50+WOMEN PO) Take 1 tablet by mouth daily.    [provider]  polyethylene glycol powder (GLYCOLAX/MIRALAX) 17 GM/SCOOP powder Take 17 g by mouth daily.    [provider]  vitamin B-12 (CYANOCOBALAMIN) 500 MCG tablet Take 500 mcg by mouth daily. 12/24/21   [provider]      Allergies    Fosamax [alendronate], Latex, Tetanus toxoids, and Vibra-tab [doxycycline]    Review of Systems   Review of Systems  Reason unable to perform ROS: Patient is disoriented  and does not answer questions accurately when asked 2/2 dementia, limiting the ROS interview and its validity.    Physical Exam Updated Vital Signs BP 122/70 (BP Location: Right Arm)   Pulse 65   Temp 98.1 F (36.7 C) (Oral)   Resp 16   SpO2 98%    Physical Exam Constitutional:      General: She is not in acute distress.    Appearance: Normal appearance. She is normal weight. She is not ill-appearing or diaphoretic.  HENT:     Head: Normocephalic and atraumatic.     Mouth/Throat:     Mouth: Mucous  membranes are moist.  Eyes:     Extraocular Movements: Extraocular movements intact.     Pupils: Pupils are equal, round, and reactive to light.  Neck:     Comments: No cervical muscle tenderness Cardiovascular:     Rate and Rhythm: Normal rate and regular rhythm.     Pulses: Normal pulses.     Heart sounds: Normal heart sounds. No murmur heard.    No friction rub. No gallop.  Pulmonary:     Effort: Pulmonary effort is normal.     Breath sounds: Normal breath sounds.  Abdominal:     General: Abdomen is flat. Bowel sounds are normal. There is no distension.     Palpations: Abdomen is soft.     Tenderness: There is no abdominal tenderness. There is no right CVA tenderness, left CVA tenderness, guarding or rebound.  Genitourinary:    Comments: No suprapubic tenderness Musculoskeletal:        General: No swelling, tenderness, deformity or signs of injury.     Right lower leg: No edema.     Left lower leg: No edema.  Skin:    General: Skin is warm and dry.     Capillary Refill: Capillary refill takes less than 2 seconds.     Findings: No bruising, erythema or lesion.  Neurological:     General: No focal deficit present.     Mental Status: She is alert. Mental status is at baseline. She is disoriented.     Motor: No weakness.     Coordination: Coordination normal.     Gait: Gait normal.     Deep Tendon Reflexes: Reflexes normal.     Comments: Alert and Pleasantly disoriented, but redirectable, which is her baseline.    Neuro exam limited by the patient limitations following commands. Initial gait exam deferred after imaging studies.  On re-examination, patient was ambulating around room without assistance, moving all extremities without problem or signs of pain or discomfort. She was initially unstable when getting up from sitting down, but once able to position her feet, patient was able to remain upright, stable, and ambulate without abnormal coordination.     ED Results /  Procedures / Treatments   Labs (all labs ordered are listed, but only abnormal results are displayed) Labs Reviewed  CBG MONITORING, ED   EKG None  Radiology DG Chest Portable 1 View  Result Date: 10/10/2022 CLINICAL DATA:  Fall EXAM: PORTABLE CHEST 1 VIEW COMPARISON:  05/17/2022 and prior studies FINDINGS: The cardiomediastinal silhouette is unchanged. A hiatal hernia is again noted. There is no evidence of focal airspace disease, pulmonary edema, suspicious pulmonary nodule/mass, pleural effusion, or pneumothorax. Minimal LEFT basilar atelectasis/scarring noted. No acute bony abnormalities are identified. Remote rib and RIGHT clavicle fractures again identified. IMPRESSION: 1. No acute disease. 2. Hiatal hernia. Electronically Signed   By: Cleatis Polka.D.  On: 10/10/2022 10:19   CT Head Wo Contrast  Result Date: 10/10/2022 CLINICAL DATA:  Provided history: Neck trauma. Head trauma, minor. Unwitnessed fall. EXAM: CT HEAD WITHOUT CONTRAST CT CERVICAL SPINE WITHOUT CONTRAST TECHNIQUE: Multidetector CT imaging of the head and cervical spine was performed following the standard protocol without intravenous contrast. Multiplanar CT image reconstructions of the cervical spine were also generated. RADIATION DOSE REDUCTION: This exam was performed according to the departmental dose-optimization program which includes automated exposure control, adjustment of the mA and/or kV according to patient size and/or use of iterative reconstruction technique. COMPARISON:  Brain MRI 05/17/2022. Head CT 05/17/2022. CT cervical spine 11/05/2021. FINDINGS: CT HEAD FINDINGS Brain: Moderate cerebral atrophy. Mild patchy and ill-defined hypoattenuation within the cerebral white matter, nonspecific but compatible with chronic small ischemic disease. There is no acute intracranial hemorrhage. No demarcated cortical infarct. No extra-axial fluid collection. No evidence of an intracranial mass. No midline shift. Vascular: No  hyperdense vessel.  Atherosclerotic calcifications. Skull: No fracture or aggressive osseous lesion. Sinuses/Orbits: No mass or acute finding within the imaged orbits. No significant paranasal sinus disease at the imaged levels. CT CERVICAL SPINE FINDINGS Alignment: No significant spondylolisthesis. Skull base and vertebrae: Cervical vertebral body height is maintained. No evidence of acute fracture to the cervical spine. Subtle chronic T1 anterior wedge vertebral compression deformity. Mild T3 superior endplate compression deformity. These compression deformities are unchanged as compared to the prior cervical spine CT of 11/05/2021. Soft tissues and spinal canal: No prevertebral soft tissue swelling or visible canal hematoma. Multinodular thyroid gland, previously assessed by ultrasound in 2012. No follow-up imaging is recommended. Disc levels: Mild for age cervical spondylosis. No appreciable high-grade spinal canal stenosis. No high-grade bony neural foraminal narrowing. Osseous fusion across the disc space at T3-T4. Upper chest: No consolidation within the imaged lung apices. No visible pneumothorax. IMPRESSION: CT head: 1. No evidence of acute intracranial abnormality. 2. Mild chronic small vessel changes within the cerebral white matter. 3. Moderate generalized cerebral atrophy. CT cervical spine: 1. No evidence of acute fracture to the cervical spine. 2. Mild chronic T1 and T3 vertebral compression deformities, unchanged from the prior cervical spine CT of 11/05/2021. 3. Mild for age cervical spondylosis. Electronically Signed   By: Kellie Simmering D.O.   On: 10/10/2022 10:18   CT Cervical Spine Wo Contrast  Result Date: 10/10/2022 CLINICAL DATA:  Provided history: Neck trauma. Head trauma, minor. Unwitnessed fall. EXAM: CT HEAD WITHOUT CONTRAST CT CERVICAL SPINE WITHOUT CONTRAST TECHNIQUE: Multidetector CT imaging of the head and cervical spine was performed following the standard protocol without  intravenous contrast. Multiplanar CT image reconstructions of the cervical spine were also generated. RADIATION DOSE REDUCTION: This exam was performed according to the departmental dose-optimization program which includes automated exposure control, adjustment of the mA and/or kV according to patient size and/or use of iterative reconstruction technique. COMPARISON:  Brain MRI 05/17/2022. Head CT 05/17/2022. CT cervical spine 11/05/2021. FINDINGS: CT HEAD FINDINGS Brain: Moderate cerebral atrophy. Mild patchy and ill-defined hypoattenuation within the cerebral white matter, nonspecific but compatible with chronic small ischemic disease. There is no acute intracranial hemorrhage. No demarcated cortical infarct. No extra-axial fluid collection. No evidence of an intracranial mass. No midline shift. Vascular: No hyperdense vessel.  Atherosclerotic calcifications. Skull: No fracture or aggressive osseous lesion. Sinuses/Orbits: No mass or acute finding within the imaged orbits. No significant paranasal sinus disease at the imaged levels. CT CERVICAL SPINE FINDINGS Alignment: No significant spondylolisthesis. Skull base and vertebrae: Cervical vertebral  body height is maintained. No evidence of acute fracture to the cervical spine. Subtle chronic T1 anterior wedge vertebral compression deformity. Mild T3 superior endplate compression deformity. These compression deformities are unchanged as compared to the prior cervical spine CT of 11/05/2021. Soft tissues and spinal canal: No prevertebral soft tissue swelling or visible canal hematoma. Multinodular thyroid gland, previously assessed by ultrasound in 2012. No follow-up imaging is recommended. Disc levels: Mild for age cervical spondylosis. No appreciable high-grade spinal canal stenosis. No high-grade bony neural foraminal narrowing. Osseous fusion across the disc space at T3-T4. Upper chest: No consolidation within the imaged lung apices. No visible pneumothorax.  IMPRESSION: CT head: 1. No evidence of acute intracranial abnormality. 2. Mild chronic small vessel changes within the cerebral white matter. 3. Moderate generalized cerebral atrophy. CT cervical spine: 1. No evidence of acute fracture to the cervical spine. 2. Mild chronic T1 and T3 vertebral compression deformities, unchanged from the prior cervical spine CT of 11/05/2021. 3. Mild for age cervical spondylosis. Electronically Signed   By: Kellie Simmering D.O.   On: 10/10/2022 10:18   DG Pelvis 1-2 Views  Result Date: 10/10/2022 CLINICAL DATA:  Fall today.  Pelvic pain. EXAM: PELVIS - 1-2 VIEW COMPARISON:  None Available. FINDINGS: Nondisplaced fracture involving the right inferior pubic ramus is seen which is of indeterminate age radiographically. No other acute fractures identified. No evidence of pelvic diastasis. Moderate right and mild left hip osteoarthritis noted. IMPRESSION: Nondisplaced fracture of the right inferior pubic ramus, of indeterminate age radiographically. Bilateral hip osteoarthritis. Electronically Signed   By: Marlaine Hind M.D.   On: 10/10/2022 10:14    Procedures Procedures   Medications Ordered in ED Medications - No data to display  ED Course/ Medical Decision Making/ A&P                           Medical Decision Making Amount and/or Complexity of Data Reviewed Radiology: ordered.   Patient presenting to ED for unwitnessed fall.  Nexus criteria positive given dementia status. Reviewed patient's nursing home summary, medical conditions and functional status. Reviewed  recent imaging and hospital visits for complete medical assessment. Spoke with with daughter, present at time of interview, who confirms patient is at her baseline. She also confirms pateint is a fast walker with a history of multiple mechanical falls whenever she is unsupervised.   CT head without evidence of acute  intracranial abnormalities, positive for moderate generalized cerebral atrophy. Cervical  spine without evidence of acute fractures to the cervical spine. No acute bony abnormalities on chest x-ray. XR pelvis with evidence of nondisplaced fracture of right inferior pubic ramus of indeterminate age.  Patient was re-evaluated after obtaining films. She is unsteady when initially standing, but able to ambulate without assistance. Vital signs remain stable, and patient has not complained or given signs of pain at this time. C collar removed and no neck tenderness on palpation or with passive head movement.   Given no acute changes on imaging, stable overall clinical picture and vital signs, patient is safe for discharge. Spoke with daughter the need for increased supervision of patient at memory unit given baseline hyperactivity and impulse for possible walking in the setting of repeated falls with multiple fractures.  Reviewed precautionary signs for return to the ED. daughter expressed understanding and agreed with plan for discharge.   Final Clinical Impression(s) / ED Diagnoses Final diagnoses:  Fall, initial encounter  Romana Juniper, MD 10/10/22 1644    Elnora Morrison, MD 10/17/22 1536

## 2022-10-10 NOTE — Discharge Instructions (Addendum)
You were seen in the emergency department because you have been found on the floor, suspected fall.  We have taken pictures of your brain your spine your chest and your hips and did not find any fractures or bleeding.  We are reassured by the fact that your mother is at her baseline and mental status, and that her vital signs are stable.  She is safe to return to her nursing home.  Please return if your mom is acting in an unusual way to you or if she complains of pain.  Take care, Romana Juniper, MD

## 2022-10-10 NOTE — ED Triage Notes (Signed)
Staff at [facility] report that they hear a loud "bump" and immediately found pt. To be awake and having fallen. She is alert and confused at her baseline and has no complaints. Her daughter is with her.

## 2022-10-11 ENCOUNTER — Inpatient Hospital Stay (HOSPITAL_BASED_OUTPATIENT_CLINIC_OR_DEPARTMENT_OTHER)
Admission: EM | Admit: 2022-10-11 | Discharge: 2022-10-15 | DRG: 522 | Disposition: A | Discharge status: Skilled Nursing Facility | Payer: Medicare PPO | Source: Skilled Nursing Facility | Attending: Internal Medicine | Admitting: Internal Medicine

## 2022-10-11 ENCOUNTER — Emergency Department (HOSPITAL_BASED_OUTPATIENT_CLINIC_OR_DEPARTMENT_OTHER): Payer: Medicare PPO

## 2022-10-11 ENCOUNTER — Encounter (HOSPITAL_BASED_OUTPATIENT_CLINIC_OR_DEPARTMENT_OTHER): Payer: Self-pay

## 2022-10-11 ENCOUNTER — Other Ambulatory Visit: Payer: Self-pay

## 2022-10-11 ENCOUNTER — Encounter (HOSPITAL_COMMUNITY): Payer: Self-pay

## 2022-10-11 ENCOUNTER — Emergency Department (HOSPITAL_BASED_OUTPATIENT_CLINIC_OR_DEPARTMENT_OTHER): Payer: Medicare PPO | Admitting: Radiology

## 2022-10-11 DIAGNOSIS — Z8673 Personal history of transient ischemic attack (TIA), and cerebral infarction without residual deficits: Secondary | ICD-10-CM

## 2022-10-11 DIAGNOSIS — S72012A Unspecified intracapsular fracture of left femur, initial encounter for closed fracture: Principal | ICD-10-CM | POA: Diagnosis present

## 2022-10-11 DIAGNOSIS — E785 Hyperlipidemia, unspecified: Secondary | ICD-10-CM | POA: Diagnosis present

## 2022-10-11 DIAGNOSIS — Z6821 Body mass index (BMI) 21.0-21.9, adult: Secondary | ICD-10-CM

## 2022-10-11 DIAGNOSIS — I4719 Other supraventricular tachycardia: Secondary | ICD-10-CM | POA: Diagnosis present

## 2022-10-11 DIAGNOSIS — Z888 Allergy status to other drugs, medicaments and biological substances status: Secondary | ICD-10-CM

## 2022-10-11 DIAGNOSIS — D72829 Elevated white blood cell count, unspecified: Secondary | ICD-10-CM

## 2022-10-11 DIAGNOSIS — Z803 Family history of malignant neoplasm of breast: Secondary | ICD-10-CM

## 2022-10-11 DIAGNOSIS — G301 Alzheimer's disease with late onset: Secondary | ICD-10-CM | POA: Diagnosis not present

## 2022-10-11 DIAGNOSIS — Z043 Encounter for examination and observation following other accident: Secondary | ICD-10-CM | POA: Diagnosis not present

## 2022-10-11 DIAGNOSIS — Z86010 Personal history of colonic polyps: Secondary | ICD-10-CM

## 2022-10-11 DIAGNOSIS — R296 Repeated falls: Secondary | ICD-10-CM | POA: Diagnosis present

## 2022-10-11 DIAGNOSIS — Y92128 Other place in nursing home as the place of occurrence of the external cause: Secondary | ICD-10-CM

## 2022-10-11 DIAGNOSIS — S72002A Fracture of unspecified part of neck of left femur, initial encounter for closed fracture: Secondary | ICD-10-CM | POA: Diagnosis not present

## 2022-10-11 DIAGNOSIS — F02C0 Dementia in other diseases classified elsewhere, severe, without behavioral disturbance, psychotic disturbance, mood disturbance, and anxiety: Secondary | ICD-10-CM | POA: Diagnosis present

## 2022-10-11 DIAGNOSIS — Z9049 Acquired absence of other specified parts of digestive tract: Secondary | ICD-10-CM

## 2022-10-11 DIAGNOSIS — M25562 Pain in left knee: Secondary | ICD-10-CM | POA: Diagnosis not present

## 2022-10-11 DIAGNOSIS — K5730 Diverticulosis of large intestine without perforation or abscess without bleeding: Secondary | ICD-10-CM | POA: Diagnosis not present

## 2022-10-11 DIAGNOSIS — Z887 Allergy status to serum and vaccine status: Secondary | ICD-10-CM

## 2022-10-11 DIAGNOSIS — S51012A Laceration without foreign body of left elbow, initial encounter: Secondary | ICD-10-CM | POA: Diagnosis present

## 2022-10-11 DIAGNOSIS — Z66 Do not resuscitate: Secondary | ICD-10-CM | POA: Diagnosis not present

## 2022-10-11 DIAGNOSIS — R102 Pelvic and perineal pain: Secondary | ICD-10-CM | POA: Diagnosis not present

## 2022-10-11 DIAGNOSIS — M8580 Other specified disorders of bone density and structure, unspecified site: Secondary | ICD-10-CM | POA: Diagnosis present

## 2022-10-11 DIAGNOSIS — M79652 Pain in left thigh: Secondary | ICD-10-CM | POA: Diagnosis not present

## 2022-10-11 DIAGNOSIS — E440 Moderate protein-calorie malnutrition: Secondary | ICD-10-CM | POA: Insufficient documentation

## 2022-10-11 DIAGNOSIS — Z881 Allergy status to other antibiotic agents status: Secondary | ICD-10-CM

## 2022-10-11 DIAGNOSIS — W1830XA Fall on same level, unspecified, initial encounter: Secondary | ICD-10-CM | POA: Diagnosis present

## 2022-10-11 DIAGNOSIS — F0280 Dementia in other diseases classified elsewhere without behavioral disturbance: Secondary | ICD-10-CM | POA: Diagnosis not present

## 2022-10-11 DIAGNOSIS — S72001A Fracture of unspecified part of neck of right femur, initial encounter for closed fracture: Secondary | ICD-10-CM | POA: Insufficient documentation

## 2022-10-11 DIAGNOSIS — Z79899 Other long term (current) drug therapy: Secondary | ICD-10-CM

## 2022-10-11 DIAGNOSIS — S80212A Abrasion, left knee, initial encounter: Secondary | ICD-10-CM | POA: Diagnosis present

## 2022-10-11 DIAGNOSIS — Y9301 Activity, walking, marching and hiking: Secondary | ICD-10-CM | POA: Diagnosis present

## 2022-10-11 DIAGNOSIS — S7012XA Contusion of left thigh, initial encounter: Secondary | ICD-10-CM | POA: Diagnosis present

## 2022-10-11 DIAGNOSIS — Z515 Encounter for palliative care: Secondary | ICD-10-CM

## 2022-10-11 DIAGNOSIS — Z91040 Latex allergy status: Secondary | ICD-10-CM

## 2022-10-11 DIAGNOSIS — Z8719 Personal history of other diseases of the digestive system: Secondary | ICD-10-CM

## 2022-10-11 LAB — CBC WITH DIFFERENTIAL/PLATELET
Abs Immature Granulocytes: 0.07 10*3/uL (ref 0.00–0.07)
Basophils Absolute: 0 10*3/uL (ref 0.0–0.1)
Basophils Relative: 0 %
Eosinophils Absolute: 0.1 10*3/uL (ref 0.0–0.5)
Eosinophils Relative: 1 %
HCT: 40.2 % (ref 36.0–46.0)
Hemoglobin: 12.8 g/dL (ref 12.0–15.0)
Immature Granulocytes: 1 %
Lymphocytes Relative: 10 %
Lymphs Abs: 1.2 10*3/uL (ref 0.7–4.0)
MCH: 28.8 pg (ref 26.0–34.0)
MCHC: 31.8 g/dL (ref 30.0–36.0)
MCV: 90.3 fL (ref 80.0–100.0)
Monocytes Absolute: 0.9 10*3/uL (ref 0.1–1.0)
Monocytes Relative: 7 %
Neutro Abs: 10 10*3/uL — ABNORMAL HIGH (ref 1.7–7.7)
Neutrophils Relative %: 81 %
Platelets: 233 10*3/uL (ref 150–400)
RBC: 4.45 MIL/uL (ref 3.87–5.11)
RDW: 13.4 % (ref 11.5–15.5)
WBC: 12.3 10*3/uL — ABNORMAL HIGH (ref 4.0–10.5)
nRBC: 0 % (ref 0.0–0.2)

## 2022-10-11 LAB — BASIC METABOLIC PANEL
Anion gap: 8 (ref 5–15)
BUN: 24 mg/dL — ABNORMAL HIGH (ref 8–23)
CO2: 27 mmol/L (ref 22–32)
Calcium: 9.8 mg/dL (ref 8.9–10.3)
Chloride: 103 mmol/L (ref 98–111)
Creatinine, Ser: 1 mg/dL (ref 0.44–1.00)
GFR, Estimated: 54 mL/min — ABNORMAL LOW (ref >60)
Glucose, Bld: 123 mg/dL — ABNORMAL HIGH (ref 70–99)
Potassium: 4.7 mmol/L (ref 3.5–5.1)
Sodium: 138 mmol/L (ref 135–145)

## 2022-10-11 LAB — URINALYSIS, ROUTINE W REFLEX MICROSCOPIC
Bilirubin Urine: NEGATIVE
Glucose, UA: NEGATIVE mg/dL
Hgb urine dipstick: NEGATIVE
Ketones, ur: NEGATIVE mg/dL
Nitrite: NEGATIVE
Protein, ur: NEGATIVE mg/dL
Specific Gravity, Urine: 1.02 (ref 1.005–1.030)
pH: 7 (ref 5.0–8.0)

## 2022-10-11 LAB — URINALYSIS, MICROSCOPIC (REFLEX)
Bacteria, UA: NONE SEEN
Squamous Epithelial / HPF: NONE SEEN (ref 0–5)

## 2022-10-11 MED ORDER — ACETAMINOPHEN 325 MG PO TABS
650.0000 mg | ORAL_TABLET | Freq: Once | ORAL | Status: AC
  Administered 2022-10-11: 650 mg via ORAL
  Filled 2022-10-11: qty 2

## 2022-10-11 MED ORDER — ACETAMINOPHEN 500 MG PO TABS
500.0000 mg | ORAL_TABLET | Freq: Once | ORAL | Status: AC
  Administered 2022-10-11: 500 mg via ORAL
  Filled 2022-10-11: qty 1

## 2022-10-11 NOTE — ED Notes (Signed)
In and out cath done per orders, Jennette Banker in attendance

## 2022-10-11 NOTE — ED Provider Notes (Signed)
Physical Exam  BP (!) 112/56   Pulse 61   Temp 98.1 F (36.7 C) (Oral)   Resp 18   Ht '5\' 4"'$  (1.626 m)   Wt 58.1 kg   SpO2 96%   BMI 21.97 kg/m   Physical Exam  Procedures  Procedures  ED Course / MDM   Clinical Course as of 10/12/22 0001  Mon Oct 11, 2022  1721 Spoke with resident assistant at [facility] that witnessed the fall.  Confirms the patient grabbed a chair, the chair fell and patient fell with a chair onto her left hip and left elbow.  Patient did not hit her head.  Patient has been acting slightly abnormal for the past 24 hours and has been wandering a lot more than she usually does. [AS]  8315 Daughter is at bedside.  States she did not witness the fall.  States patient is a fast walker for her age and typically needs observation in order to prevent falls. [AS]  1852 Daughter was updated on plan. Patient is unable to follow directions and give a urine sample. We will bladder scan and in and out catheter for a urine sample [AS]  2115 Discussed with family regarding the patient.  Agreed for CT left hip if negative, DC home. [CR]  2205 Consulted Dr. Rolena Infante of orthopedics.  Recommend admission to [facility] long through hospital medicine. [CR]  2348 [facility] hospital medicine Dr. Arbie Cookey regarding the patient.  She agreed with admission of the patient to [facility]. [CR]    Clinical Course User Index [AS] Roylene Reason, PA-C [CR] Wilnette Kales, [place]   Medical Decision Making Amount and/or Complexity of Data Reviewed Labs: ordered. Radiology: ordered.  Risk OTC drugs. Decision regarding hospitalization.   Patient care handed off from [facility], PA-C at shift change.  In short, patient had witnessed fall at memory care unit of [facility].  She was standing up holding onto a chair when the chair slipped out from a throat and she landed on her left side.  Patient has been not been able to ambulate since then.  She usually ambulates without assistance at  baseline.  Denies any presyncopal symptoms, fever, chills, night sweats, cough, congestion, shortness of breath, abdominal pain, nausea, vomiting, urinary symptoms, change in bowel habits.  Currently complaining of left leg pain.  She has been unable to bear weight on affected extremity since the incident.   Physical exam consistent for tenderness to palpation of left hip.  Pedal pulses full and intact bilaterally.  Patient able to bend at knee as well as at ankle but with pain when movement of left hip occurs.  No obvious shortening or external/internal rotation of affected leg.  X-ray findings were negative for any acute abnormalities.  We will pursue CT imaging given lack of ability of patient to ambulate on affected extremity as well as patient complaining of severe pain with slightest movement of left lower extremity.  Laboratory studies: Mild leukocytosis of 12.3 but doubt infectious etiology.  No evidence of anemia.  Platelets within range.  No electrolyte abnormalities noted.  Renal function within patient's baseline with a GFR 54, creatinine 1.0 and mild elevation of BUN of 24.  UA significant for small leukocytes but without bacteria or WBC.  Imaging studies: CT pelvis acute nondisplaced left femoral neck fracture.  X-rays of left knee, left femur and pelvis without any acute abnormalities.  CT imaging of the left hip showed left nondisplaced femoral neck fracture.  Admission  to the hospital pursued as indicated above in the ED course.  Discussed with patient and family regarding plans for hospital admission.  They were agreeable to said plan.  See ED course for above consultations.  Patient stable upon admission to hospital.      Wilnette Kales, PA 10/12/22 0001    Tretha Sciara, MD 10/13/22 1501

## 2022-10-11 NOTE — ED Triage Notes (Signed)
Pt BIB [facility] from [facility] for witnessed fall. Staff reports that pt tripped over chair and is now complaining of L leg pain. No thinners, no LOC, did not hit head. Pt has advanced dementia. VSS with EMS. C Collar in place due to pt inability to answer EMS questions.

## 2022-10-11 NOTE — ED Provider Notes (Signed)
[place] EMERGENCY DEPT Provider Note   CSN: [id number] Arrival date & time: 10/11/22  1637     History  Chief Complaint  Patient presents with   Lytle Michaels    Autumn Wallace is a 88 y.o. female.  With a history of dementia, osteopenia, TIA who presents ED via EMS for evaluation of a fall.  Per report, patient had a witnessed fall at approximately 3 PM today.  She was walking in.  Hold of her chair.  The chair fell over and patient fell with it.  Patient fell on her left hip and elbow.  Did not hit her head or lose consciousness.  Patient does not take any blood thinners.  Patient immediately began complaining of left leg and hip pain.  She is normally ambulatory without assistive devices, but nursing staff assisted patient to her feet and patient was unable to bear weight on her left leg secondary to pain.  Nursing staff reported that patient has been more agitated than normal and has been walking around more than she normally does.  This has been ongoing for the past 24 hours.  Patient initially presents to the ED in a c-collar because EMS could not obtain history from the patient.  History obtained from EMS, family and nursing home staff. Patient has advanced dementia and is disoriented at baseline.    Fall       Home Medications Prior to Admission medications   Medication Sig Start Date End Date Taking? Authorizing Provider  acetaminophen (TYLENOL) 325 MG tablet Take 2 tablets (650 mg total) by mouth every 6 (six) hours as needed for up to 30 doses for moderate pain or mild pain. Patient taking differently: Take 650 mg by mouth every 8 (eight) hours as needed for moderate pain or mild pain. 03/05/22   Wyvonnia Dusky, MD  cholecalciferol (VITAMIN D3) 25 MCG (1000 UNIT) tablet Take 2,000 Units by mouth daily.    [provider]  Cranberry-Vitamin C-Vitamin E (CRANBERRY PLUS VITAMIN C PO) Take 1 capsule by mouth daily. Cranberry 4200 mg with Vitamin C (unknown  strength)    [provider]  docusate sodium (COLACE) 100 MG capsule Take 200 mg by mouth daily.    [provider]  Ensure (ENSURE) Take 237 mLs by mouth daily.    [provider]  escitalopram (LEXAPRO) 10 MG tablet Take 10 mg by mouth daily. 08/12/19   [provider]  ipratropium (ATROVENT) 0.06 % nasal spray Place 1 spray into both nostrils 3 (three) times daily.    [provider]  memantine (NAMENDA) 10 MG tablet Take 10 mg by mouth 2 (two) times daily. 08/07/19   [provider]  Multiple Vitamins-Minerals (CENTRUM SILVER 50+WOMEN PO) Take 1 tablet by mouth daily.    [provider]  polyethylene glycol powder (GLYCOLAX/MIRALAX) 17 GM/SCOOP powder Take 17 g by mouth daily.    [provider]  vitamin B-12 (CYANOCOBALAMIN) 500 MCG tablet Take 500 mcg by mouth daily. 12/24/21   [provider]      Allergies    Fosamax [alendronate], Latex, Tetanus toxoids, and Vibra-tab [doxycycline]    Review of Systems   Review of Systems  Musculoskeletal:  Positive for arthralgias and myalgias.  Psychiatric/Behavioral:  Positive for confusion.   All other systems reviewed and are negative.   Physical Exam Updated Vital Signs BP (!) 119/91   Pulse (!) 55   Temp 98.1 F (36.7 C) (Oral)   Resp 18  Ht '5\' 4"'$  (1.626 m)   Wt 58.1 kg   SpO2 96%   BMI 21.97 kg/m  Physical Exam Vitals and nursing note reviewed.  Constitutional:      General: She is not in acute distress.    Appearance: Normal appearance. She is normal weight. She is not ill-appearing.  HENT:     Head: Normocephalic and atraumatic.  Cardiovascular:     Rate and Rhythm: Normal rate and regular rhythm.     Pulses: Normal pulses.          Radial pulses are 2+ on the right side and 2+ on the left side.       Dorsalis pedis pulses are 2+ on the right side and 2+ on the left side.     Heart sounds: Normal heart sounds.  Pulmonary:     Effort:  Pulmonary effort is normal. No respiratory distress.  Abdominal:     General: Abdomen is flat.  Musculoskeletal:        General: Tenderness (left hip and knee) present. No deformity. Normal range of motion.     Cervical back: Neck supple.     Right lower leg: No edema.     Left lower leg: No edema.     Comments: No obvious shortening or external rotation of the left leg.   Skin:    General: Skin is warm and dry.     Capillary Refill: Capillary refill takes less than 2 seconds.     Comments: Small abrasion to anterior left knee.  Superficial skin tear to left elbow, approx 2 cm in length. No active bleeding.   Neurological:     Mental Status: She is alert. Mental status is at baseline. She is disoriented.     Comments: Patient is disoriented at baseline. She is alert but not oriented to person, place, or self. Only able to recite her first name.   Psychiatric:        Mood and Affect: Mood normal.        Behavior: Behavior normal.     ED Results / Procedures / Treatments   Labs (all labs ordered are listed, but only abnormal results are displayed) Labs Reviewed  BASIC METABOLIC PANEL  CBC WITH DIFFERENTIAL/PLATELET  URINALYSIS, ROUTINE W REFLEX MICROSCOPIC    EKG None  Radiology DG Chest Portable 1 View  Result Date: 10/10/2022 CLINICAL DATA:  Fall EXAM: PORTABLE CHEST 1 VIEW COMPARISON:  05/17/2022 and prior studies FINDINGS: The cardiomediastinal silhouette is unchanged. A hiatal hernia is again noted. There is no evidence of focal airspace disease, pulmonary edema, suspicious pulmonary nodule/mass, pleural effusion, or pneumothorax. Minimal LEFT basilar atelectasis/scarring noted. No acute bony abnormalities are identified. Remote rib and RIGHT clavicle fractures again identified. IMPRESSION: 1. No acute disease. 2. Hiatal hernia. Electronically Signed   By: Margarette Canada M.D.   On: 10/10/2022 10:19   CT Head Wo Contrast  Result Date: 10/10/2022 CLINICAL DATA:  Provided  history: Neck trauma. Head trauma, minor. Unwitnessed fall. EXAM: CT HEAD WITHOUT CONTRAST CT CERVICAL SPINE WITHOUT CONTRAST TECHNIQUE: Multidetector CT imaging of the head and cervical spine was performed following the standard protocol without intravenous contrast. Multiplanar CT image reconstructions of the cervical spine were also generated. RADIATION DOSE REDUCTION: This exam was performed according to the departmental dose-optimization program which includes automated exposure control, adjustment of the mA and/or kV according to patient size and/or use of iterative reconstruction technique. COMPARISON:  Brain MRI 05/17/2022. Head CT 05/17/2022. CT cervical spine  11/05/2021. FINDINGS: CT HEAD FINDINGS Brain: Moderate cerebral atrophy. Mild patchy and ill-defined hypoattenuation within the cerebral white matter, nonspecific but compatible with chronic small ischemic disease. There is no acute intracranial hemorrhage. No demarcated cortical infarct. No extra-axial fluid collection. No evidence of an intracranial mass. No midline shift. Vascular: No hyperdense vessel.  Atherosclerotic calcifications. Skull: No fracture or aggressive osseous lesion. Sinuses/Orbits: No mass or acute finding within the imaged orbits. No significant paranasal sinus disease at the imaged levels. CT CERVICAL SPINE FINDINGS Alignment: No significant spondylolisthesis. Skull base and vertebrae: Cervical vertebral body height is maintained. No evidence of acute fracture to the cervical spine. Subtle chronic T1 anterior wedge vertebral compression deformity. Mild T3 superior endplate compression deformity. These compression deformities are unchanged as compared to the prior cervical spine CT of 11/05/2021. Soft tissues and spinal canal: No prevertebral soft tissue swelling or visible canal hematoma. Multinodular thyroid gland, previously assessed by ultrasound in 2012. No follow-up imaging is recommended. Disc levels: Mild for age cervical  spondylosis. No appreciable high-grade spinal canal stenosis. No high-grade bony neural foraminal narrowing. Osseous fusion across the disc space at T3-T4. Upper chest: No consolidation within the imaged lung apices. No visible pneumothorax. IMPRESSION: CT head: 1. No evidence of acute intracranial abnormality. 2. Mild chronic small vessel changes within the cerebral white matter. 3. Moderate generalized cerebral atrophy. CT cervical spine: 1. No evidence of acute fracture to the cervical spine. 2. Mild chronic T1 and T3 vertebral compression deformities, unchanged from the prior cervical spine CT of 11/05/2021. 3. Mild for age cervical spondylosis. Electronically Signed   By: Kellie Simmering D.O.   On: 10/10/2022 10:18   CT Cervical Spine Wo Contrast  Result Date: 10/10/2022 CLINICAL DATA:  Provided history: Neck trauma. Head trauma, minor. Unwitnessed fall. EXAM: CT HEAD WITHOUT CONTRAST CT CERVICAL SPINE WITHOUT CONTRAST TECHNIQUE: Multidetector CT imaging of the head and cervical spine was performed following the standard protocol without intravenous contrast. Multiplanar CT image reconstructions of the cervical spine were also generated. RADIATION DOSE REDUCTION: This exam was performed according to the departmental dose-optimization program which includes automated exposure control, adjustment of the mA and/or kV according to patient size and/or use of iterative reconstruction technique. COMPARISON:  Brain MRI 05/17/2022. Head CT 05/17/2022. CT cervical spine 11/05/2021. FINDINGS: CT HEAD FINDINGS Brain: Moderate cerebral atrophy. Mild patchy and ill-defined hypoattenuation within the cerebral white matter, nonspecific but compatible with chronic small ischemic disease. There is no acute intracranial hemorrhage. No demarcated cortical infarct. No extra-axial fluid collection. No evidence of an intracranial mass. No midline shift. Vascular: No hyperdense vessel.  Atherosclerotic calcifications. Skull: No  fracture or aggressive osseous lesion. Sinuses/Orbits: No mass or acute finding within the imaged orbits. No significant paranasal sinus disease at the imaged levels. CT CERVICAL SPINE FINDINGS Alignment: No significant spondylolisthesis. Skull base and vertebrae: Cervical vertebral body height is maintained. No evidence of acute fracture to the cervical spine. Subtle chronic T1 anterior wedge vertebral compression deformity. Mild T3 superior endplate compression deformity. These compression deformities are unchanged as compared to the prior cervical spine CT of 11/05/2021. Soft tissues and spinal canal: No prevertebral soft tissue swelling or visible canal hematoma. Multinodular thyroid gland, previously assessed by ultrasound in 2012. No follow-up imaging is recommended. Disc levels: Mild for age cervical spondylosis. No appreciable high-grade spinal canal stenosis. No high-grade bony neural foraminal narrowing. Osseous fusion across the disc space at T3-T4. Upper chest: No consolidation within the imaged lung apices. No visible pneumothorax. IMPRESSION:  CT head: 1. No evidence of acute intracranial abnormality. 2. Mild chronic small vessel changes within the cerebral white matter. 3. Moderate generalized cerebral atrophy. CT cervical spine: 1. No evidence of acute fracture to the cervical spine. 2. Mild chronic T1 and T3 vertebral compression deformities, unchanged from the prior cervical spine CT of 11/05/2021. 3. Mild for age cervical spondylosis. Electronically Signed   By: Kellie Simmering D.O.   On: 10/10/2022 10:18   DG Pelvis 1-2 Views  Result Date: 10/10/2022 CLINICAL DATA:  Fall today.  Pelvic pain. EXAM: PELVIS - 1-2 VIEW COMPARISON:  None Available. FINDINGS: Nondisplaced fracture involving the right inferior pubic ramus is seen which is of indeterminate age radiographically. No other acute fractures identified. No evidence of pelvic diastasis. Moderate right and mild left hip osteoarthritis noted.  IMPRESSION: Nondisplaced fracture of the right inferior pubic ramus, of indeterminate age radiographically. Bilateral hip osteoarthritis. Electronically Signed   By: Marlaine Hind M.D.   On: 10/10/2022 10:14    Procedures Procedures    Medications Ordered in ED Medications  acetaminophen (TYLENOL) tablet 650 mg (has no administration in time range)    ED Course/ Medical Decision Making/ A&P Clinical Course as of 10/11/22 1854  Mon Oct 11, 2022  1721 Spoke with resident assistant at [facility] that witnessed the fall.  Confirms the patient grabbed a chair, the chair fell and patient fell with a chair onto her left hip and left elbow.  Patient did not hit her head.  Patient has been acting slightly abnormal for the past 24 hours and has been wandering a lot more than she usually does. [AS]  9476 Daughter is at bedside.  States she did not witness the fall.  States patient is a fast walker for her age and typically needs observation in order to prevent falls. [AS]  5465 Imaging, ambulate [CR]  1852 Daughter was updated on plan. Patient is unable to follow directions and give a urine sample. We will bladder scan and in and out catheter for a urine sample [AS]    Clinical Course User Index [AS] Claudie Fisherman Grafton Folk, PA-C [CR] Wilnette Kales, [place]                           Medical Decision Making Amount and/or Complexity of Data Reviewed Labs: ordered. Radiology: ordered.  Risk OTC drugs.  This patient presents to the ED for concern of fall with left leg pain, this involves an extensive number of treatment options, and is a complaint that carries with it a high risk of complications and morbidity.  The differential diagnosis includes hip/femur fracture, contusion, sprain, strain   Co morbidities that complicate the patient evaluation  advanced dementia, history of falls  My initial workup includes basic labs, urinalysis to assess cause of falls, x ray left hip, femur and  knee  Additional history obtained from: Nursing notes from this visit. Previous records within EMR system ED visit yesterday for unwitnessed fall Family daughter provides a portion of the history EMS provides a portion of the history  I ordered, reviewed and interpreted labs which include: CBC, CMP, urinalysis. CBC shows leukocytosis of 12.3, urinalysis pending at time of handoff   I ordered imaging studies including x ray left hip, femur and knee I independently visualized and interpreted imaging which showed pending at the time of handoff  Afebrile, hemodynamically stable. Patient is an 88 year old female who presents to the ED for  evaluation of a fall.  Patient did have an unwitnessed fall yesterday and had extensive imaging done at that time.  She had a witnessed fall today where she fell on her left hip, knee and elbow.  Did not hit her head or lose consciousness.  Due to multiple recent falls, labs were obtained to determine a cause. Physical exam remarkable for left knee and hip tenderness as well as abrasions to left knee and elbow. X rays were ordered and are pending at the time of handoff. Urinalysis pending at the time of handoff. Current plan is to await x ray results. If negative, patient will attempt ambulation and return home with her home assistive device if successful. Plan is to treat UTI if present.   Care will be handed off to Dion Saucier, PA-C pending workup with plan as stated above. Plan can change at the discretion of the oncoming provider. See his note for final disposition.  Note: Portions of this report may have been transcribed using voice recognition software. Every effort was made to ensure accuracy; however, inadvertent computerized transcription errors may still be present.          Final Clinical Impression(s) / ED Diagnoses Final diagnoses:  None    Rx / DC Orders ED Discharge Orders     None         Roylene Reason, Hershal Coria 10/11/22  Elisabeth Cara, MD 10/13/22 [phone]

## 2022-10-11 NOTE — ED Notes (Signed)
Pt was able to tolerate water. No issues with swallowing. No N/V or spitting up.

## 2022-10-11 NOTE — ED Notes (Signed)
Pt placed on 2L [place] due to oxygen saturation of 88% while sleeping. MD notified.

## 2022-10-11 NOTE — ED Notes (Signed)
Pt ambulation attempted. Pt unable/unwilling to ambulate. Family attempted to help in ambulating Pt still not able to ambulate. Provider aware.

## 2022-10-12 ENCOUNTER — Encounter (HOSPITAL_COMMUNITY): Payer: Self-pay | Admitting: Family Medicine

## 2022-10-12 DIAGNOSIS — S80212A Abrasion, left knee, initial encounter: Secondary | ICD-10-CM | POA: Diagnosis present

## 2022-10-12 DIAGNOSIS — Z887 Allergy status to serum and vaccine status: Secondary | ICD-10-CM | POA: Diagnosis not present

## 2022-10-12 DIAGNOSIS — W1830XA Fall on same level, unspecified, initial encounter: Secondary | ICD-10-CM | POA: Diagnosis present

## 2022-10-12 DIAGNOSIS — Z888 Allergy status to other drugs, medicaments and biological substances status: Secondary | ICD-10-CM | POA: Diagnosis not present

## 2022-10-12 DIAGNOSIS — F02C0 Dementia in other diseases classified elsewhere, severe, without behavioral disturbance, psychotic disturbance, mood disturbance, and anxiety: Secondary | ICD-10-CM | POA: Diagnosis present

## 2022-10-12 DIAGNOSIS — Z8673 Personal history of transient ischemic attack (TIA), and cerebral infarction without residual deficits: Secondary | ICD-10-CM | POA: Diagnosis not present

## 2022-10-12 DIAGNOSIS — Y9301 Activity, walking, marching and hiking: Secondary | ICD-10-CM | POA: Diagnosis present

## 2022-10-12 DIAGNOSIS — D72829 Elevated white blood cell count, unspecified: Secondary | ICD-10-CM | POA: Diagnosis present

## 2022-10-12 DIAGNOSIS — I2721 Secondary pulmonary arterial hypertension: Secondary | ICD-10-CM | POA: Diagnosis not present

## 2022-10-12 DIAGNOSIS — Z6821 Body mass index (BMI) 21.0-21.9, adult: Secondary | ICD-10-CM | POA: Diagnosis not present

## 2022-10-12 DIAGNOSIS — Z79899 Other long term (current) drug therapy: Secondary | ICD-10-CM | POA: Diagnosis not present

## 2022-10-12 DIAGNOSIS — Z9049 Acquired absence of other specified parts of digestive tract: Secondary | ICD-10-CM | POA: Diagnosis not present

## 2022-10-12 DIAGNOSIS — S72001A Fracture of unspecified part of neck of right femur, initial encounter for closed fracture: Secondary | ICD-10-CM | POA: Diagnosis present

## 2022-10-12 DIAGNOSIS — Y92128 Other place in nursing home as the place of occurrence of the external cause: Secondary | ICD-10-CM | POA: Diagnosis not present

## 2022-10-12 DIAGNOSIS — E785 Hyperlipidemia, unspecified: Secondary | ICD-10-CM | POA: Diagnosis present

## 2022-10-12 DIAGNOSIS — Z881 Allergy status to other antibiotic agents status: Secondary | ICD-10-CM | POA: Diagnosis not present

## 2022-10-12 DIAGNOSIS — M8580 Other specified disorders of bone density and structure, unspecified site: Secondary | ICD-10-CM | POA: Diagnosis present

## 2022-10-12 DIAGNOSIS — K449 Diaphragmatic hernia without obstruction or gangrene: Secondary | ICD-10-CM | POA: Diagnosis not present

## 2022-10-12 DIAGNOSIS — Z91040 Latex allergy status: Secondary | ICD-10-CM | POA: Diagnosis not present

## 2022-10-12 DIAGNOSIS — S51012A Laceration without foreign body of left elbow, initial encounter: Secondary | ICD-10-CM | POA: Diagnosis present

## 2022-10-12 DIAGNOSIS — G301 Alzheimer's disease with late onset: Secondary | ICD-10-CM | POA: Diagnosis present

## 2022-10-12 DIAGNOSIS — E440 Moderate protein-calorie malnutrition: Secondary | ICD-10-CM | POA: Diagnosis present

## 2022-10-12 DIAGNOSIS — Z86010 Personal history of colonic polyps: Secondary | ICD-10-CM | POA: Diagnosis not present

## 2022-10-12 DIAGNOSIS — F0390 Unspecified dementia without behavioral disturbance: Secondary | ICD-10-CM | POA: Diagnosis not present

## 2022-10-12 DIAGNOSIS — Z66 Do not resuscitate: Secondary | ICD-10-CM | POA: Diagnosis not present

## 2022-10-12 DIAGNOSIS — R296 Repeated falls: Secondary | ICD-10-CM | POA: Diagnosis present

## 2022-10-12 DIAGNOSIS — S72002A Fracture of unspecified part of neck of left femur, initial encounter for closed fracture: Secondary | ICD-10-CM | POA: Diagnosis not present

## 2022-10-12 DIAGNOSIS — Z803 Family history of malignant neoplasm of breast: Secondary | ICD-10-CM | POA: Diagnosis not present

## 2022-10-12 DIAGNOSIS — Z515 Encounter for palliative care: Secondary | ICD-10-CM | POA: Diagnosis not present

## 2022-10-12 DIAGNOSIS — I4719 Other supraventricular tachycardia: Secondary | ICD-10-CM | POA: Diagnosis present

## 2022-10-12 DIAGNOSIS — S72012A Unspecified intracapsular fracture of left femur, initial encounter for closed fracture: Secondary | ICD-10-CM | POA: Diagnosis present

## 2022-10-12 DIAGNOSIS — Z8719 Personal history of other diseases of the digestive system: Secondary | ICD-10-CM | POA: Diagnosis not present

## 2022-10-12 LAB — CBC
HCT: 38.6 % (ref 36.0–46.0)
Hemoglobin: 12.7 g/dL (ref 12.0–15.0)
MCH: 29.1 pg (ref 26.0–34.0)
MCHC: 32.9 g/dL (ref 30.0–36.0)
MCV: 88.5 fL (ref 80.0–100.0)
Platelets: 214 10*3/uL (ref 150–400)
RBC: 4.36 MIL/uL (ref 3.87–5.11)
RDW: 13.4 % (ref 11.5–15.5)
WBC: 8.7 10*3/uL (ref 4.0–10.5)
nRBC: 0 % (ref 0.0–0.2)

## 2022-10-12 LAB — BASIC METABOLIC PANEL
Anion gap: 7 (ref 5–15)
BUN: 17 mg/dL (ref 8–23)
CO2: 25 mmol/L (ref 22–32)
Calcium: 8.8 mg/dL — ABNORMAL LOW (ref 8.9–10.3)
Chloride: 108 mmol/L (ref 98–111)
Creatinine, Ser: 0.85 mg/dL (ref 0.44–1.00)
GFR, Estimated: >60 mL/min (ref >60)
Glucose, Bld: 121 mg/dL — ABNORMAL HIGH (ref 70–99)
Potassium: 4.1 mmol/L (ref 3.5–5.1)
Sodium: 140 mmol/L (ref 135–145)

## 2022-10-12 LAB — TYPE AND SCREEN
ABO/RH(D): B NEG
Antibody Screen: NEGATIVE

## 2022-10-12 LAB — ABO/RH: ABO/RH(D): B NEG

## 2022-10-12 LAB — GLUCOSE, CAPILLARY: Glucose-Capillary: 111 mg/dL — ABNORMAL HIGH (ref 70–99)

## 2022-10-12 MED ORDER — VITAMIN D 25 MCG (1000 UNIT) PO TABS
2000.0000 [IU] | ORAL_TABLET | Freq: Every day | ORAL | Status: DC
  Administered 2022-10-14 – 2022-10-15 (×2): 2000 [IU] via ORAL
  Filled 2022-10-12 (×2): qty 2

## 2022-10-12 MED ORDER — ADULT MULTIVITAMIN W/MINERALS CH
1.0000 | ORAL_TABLET | Freq: Every day | ORAL | Status: DC
  Administered 2022-10-13 – 2022-10-15 (×3): 1 via ORAL
  Filled 2022-10-12 (×3): qty 1

## 2022-10-12 MED ORDER — IPRATROPIUM BROMIDE 0.06 % NA SOLN: 1.0000 | Freq: Three times a day (TID) | NASAL | Status: DC

## 2022-10-12 MED ORDER — DOCUSATE SODIUM 100 MG PO CAPS: 200.0000 mg | ORAL_CAPSULE | Freq: Every day | ORAL | Status: DC

## 2022-10-12 MED ORDER — ENSURE ENLIVE PO LIQD
237.0000 mL | Freq: Every day | ORAL | Status: DC
  Administered 2022-10-14 – 2022-10-15 (×2): 237 mL via ORAL

## 2022-10-12 MED ORDER — ESCITALOPRAM OXALATE 10 MG PO TABS
10.0000 mg | ORAL_TABLET | Freq: Every day | ORAL | Status: DC
  Administered 2022-10-12 – 2022-10-15 (×3): 10 mg via ORAL
  Filled 2022-10-12 (×3): qty 1

## 2022-10-12 MED ORDER — OXYCODONE HCL 5 MG PO TABS: 2.5000 mg | ORAL_TABLET | ORAL | Status: DC | PRN

## 2022-10-12 MED ORDER — POLYETHYLENE GLYCOL 3350 17 G PO PACK
17.0000 g | PACK | Freq: Every day | ORAL | Status: DC
  Administered 2022-10-14 – 2022-10-15 (×2): 17 g via ORAL
  Filled 2022-10-12 (×2): qty 1

## 2022-10-12 MED ORDER — MEMANTINE HCL 10 MG PO TABS
10.0000 mg | ORAL_TABLET | Freq: Two times a day (BID) | ORAL | Status: DC
  Administered 2022-10-13 – 2022-10-15 (×4): 10 mg via ORAL
  Filled 2022-10-12 (×4): qty 1

## 2022-10-12 MED ORDER — BISACODYL 5 MG PO TBEC: 5.0000 mg | DELAYED_RELEASE_TABLET | Freq: Every day | ORAL | Status: DC | PRN

## 2022-10-12 MED ORDER — GALANTAMINE HYDROBROMIDE ER 8 MG PO CP24
16.0000 mg | ORAL_CAPSULE | Freq: Every day | ORAL | Status: DC
  Administered 2022-10-12 – 2022-10-15 (×3): 16 mg via ORAL
  Filled 2022-10-12 (×4): qty 2

## 2022-10-12 MED ORDER — POLYETHYLENE GLYCOL 3350 17 G PO PACK: 17.0000 g | PACK | Freq: Every day | ORAL | Status: DC | PRN

## 2022-10-12 MED ORDER — ACETAMINOPHEN 325 MG PO TABS: 650.0000 mg | ORAL_TABLET | Freq: Four times a day (QID) | ORAL | Status: DC | PRN

## 2022-10-12 MED ORDER — ENSURE PRE-SURGERY PO LIQD
296.0000 mL | Freq: Once | ORAL | Status: DC
  Filled 2022-10-12: qty 296

## 2022-10-12 MED ORDER — MORPHINE SULFATE (PF) 2 MG/ML IV SOLN: 1.0000 mg | INTRAVENOUS | Status: DC | PRN

## 2022-10-12 MED ORDER — DIVALPROEX SODIUM 125 MG PO CSDR
125.0000 mg | DELAYED_RELEASE_CAPSULE | Freq: Two times a day (BID) | ORAL | Status: DC
  Administered 2022-10-12 – 2022-10-15 (×5): 125 mg via ORAL
  Filled 2022-10-12 (×6): qty 1

## 2022-10-12 MED ORDER — SODIUM CHLORIDE 0.9 % IV SOLN: INTRAVENOUS | Status: AC

## 2022-10-12 NOTE — TOC Initial Note (Signed)
Transition of Care [facility]) - Initial/Assessment Note    Patient Details  Name: Autumn Wallace MRN: [id number] Date of Birth: 12-31-1931  [facility]) CM/SW Contact:    Vassie Moselle, LCSW Phone Number: 10/12/2022, 2:42 PM  Clinical Narrative:                 Pt coming from [facility] unit. Pt is able to independently ambulate at baseline. PT has been consulted to evaluate this pt. TOC will follow for recommendations and discharge planning.   CSW left voicemail with pt's daughter, Salina April.    Expected Discharge Plan: Memory Care Barriers to Discharge: Continued Medical Work up   Patient Goals and CMS Choice Patient states their goals for this hospitalization and ongoing recovery are:: To return to [facility]   Choice offered to / list presented to : Adult Children  Expected Discharge Plan and Services Expected Discharge Plan: Memory Care In-house Referral: NA Discharge Planning Services: CM Consult   Living arrangements for the past 2 months: [place]                 DME Arranged: N/A DME Agency: NA                  Prior Living Arrangements/Services Living arrangements for the past 2 months: [place] Lives with:: Facility Resident Patient language and need for interpreter reviewed:: Yes Do you feel safe going back to the place where you live?: Yes      Need for Family Participation in Patient Care: Yes (Comment) Care giver support system in place?: Yes (comment)   Criminal Activity/Legal Involvement Pertinent to Current Situation/Hospitalization: No - Comment as needed  Activities of Daily Living      Permission Sought/Granted Permission sought to share information with : Facility Sport and exercise psychologist, Family Supports    Share Information with NAME: Salina April     Permission granted to share info w Relationship: Daughter  Permission granted to share info w Contact Information:  [phone]  Emotional Assessment   Attitude/Demeanor/Rapport: Unable to Assess Affect (typically observed): Unable to Assess Orientation: :  (Disoriented x 4) Alcohol / Substance Use: Not Applicable Psych Involvement: No (comment)  Admission diagnosis:  Fracture of femoral neck, right, closed ([place]) [S72.001A] Fracture of femoral neck, left (HCC) [S72.002A] Closed fracture of neck of left femur, initial encounter ([place]) [S72.002A] Patient Active Problem List   Diagnosis Date Noted   Leukocytosis 10/12/2022   Closed left hip fracture, initial encounter ([place]) 10/11/2022   Osteoporosis 06/24/2022   AMS (altered mental status) 05/17/2022   Acute hypoxic respiratory failure 05/17/2022   Liver lesion, right lobe 05/17/2022   Late onset Alzheimer's disease without behavioral disturbance ([place]) 01/03/2020   Transient ischemic attack 01/03/2020   PSVT (paroxysmal supraventricular tachycardia) 09/09/2016   Osteopenia 06/20/2012   Compression fracture of lumbosacral spine ([place]) 06/23/2010   Tendonitis, Achilles, left 06/15/2010   PCP:  Crist Infante, MD Pharmacy:   [id number] [place]Phone: [phone]: [address]Phone: [phone]: [phone]Phone: [phone]: [phone]    Social Determinants of Health (SDOH) Interventions    Readmission Risk Interventions    10/12/2022  2:40 PM  Readmission Risk Prevention Plan  Post Dischage Appt Complete  Medication Screening Complete  Transportation Screening Complete

## 2022-10-12 NOTE — Progress Notes (Signed)
PT Cancellation Note  Patient Details Name: Autumn Wallace MRN: [id number] DOB: [birth year]   Cancelled Treatment:    Reason Eval/Treat Not Completed: Patient not medically ready; ortho consulted, surgery pending. Defer eval at this time and await new orders per ortho MD  Baxter Flattery, PT  Acute Rehab Dept [facility]) [phone]  [facility] Weekend Pager [facility] only)  [phone]  10/12/2022    [facility] 10/12/2022, 8:59 AM

## 2022-10-12 NOTE — Progress Notes (Signed)
Patient was able to take small pill whole with thin liquids without complication

## 2022-10-12 NOTE — Hospital Course (Addendum)
Autumn Wallace is a 88 y.o. female with medical history significant for advanced dementia and paroxysmal SVT who presents to the emergency department with left hip pain after fall.  Patient had a witnessed fall at her facility when a chair that she was holding onto tipped over and she fell onto her left hip and elbow without hitting her head or losing consciousness.  She was unable to bear weight after this and was brought into the ED.   [place] ED Course: Upon arrival to the ED, patient is found to be afebrile and saturating well on room air initially with stable blood pressure.  Plain radiographs of the hip were negative for definite acute fracture but CT demonstrates nondisplaced acute left femoral neck fracture.  Blood work notable for WBC 12,300.     Orthopedic surgery (Dr. Rolena Infante) was consulted by the ED physician and recommended medical admission to [facility].  Patient was treated with 2 doses of acetaminophen and transferred to [facility] for admission.    Assessment/Plan    Principal Problem:   Closed left hip fracture, initial encounter ([place]) Active Problems:   Late onset Alzheimer's disease without behavioral disturbance (HCC)   Leukocytosis  1. Left hip fracture  - Non-displaced left femoral neck fracture seen on CT  - Based on the available data, Autumn Wallace presents an estimated 0.7% risk of perioperative MI or cardiac arrest  - Keep NPO, continue pain-control and supportive care  -Pending orthopedic evaluation and surgical invention -Continue as needed IV analgesics   2. Dementia  - Delirium precautions    3. Leukocytosis  - WBC 12,300 on admission without fever or other evidence for infection  - Likely reactive, culture if febrile   -Monitoring closely  Ethics: Patient is full code, due to severe advanced dementia, continue deterioration physically and mentally anticipating palliative care consultation to determine CODE STATUS, and  goals of care in near future

## 2022-10-12 NOTE — Progress Notes (Signed)
PROGRESS NOTE    Patient: Autumn Wallace                            PCP: Crist Infante, MD                    DOB: 06-26-1932            DOA: 10/11/2022 ZHY:[id number]             DOS: 10/12/2022, 2:03 PM   LOS: 0 days   Date of Service: The patient was seen and examined on 10/12/2022  Subjective:   The patient was seen and examined this morning. Hemodynamically stable, awake, confused, pain with palpation and movement of left leg Afebrile, normotensive  Brief Narrative:   Autumn Wallace is a 88 y.o. female with medical history significant for advanced dementia and paroxysmal SVT who presents to the emergency department with left hip pain after fall.  Patient had a witnessed fall at her facility when a chair that she was holding onto tipped over and she fell onto her left hip and elbow without hitting her head or losing consciousness.  She was unable to bear weight after this and was brought into the ED.   [place] ED Course: Upon arrival to the ED, patient is found to be afebrile and saturating well on room air initially with stable blood pressure.  Plain radiographs of the hip were negative for definite acute fracture but CT demonstrates nondisplaced acute left femoral neck fracture.  Blood work notable for WBC 12,300.     Orthopedic surgery (Dr. Rolena Infante) was consulted by the ED physician and recommended medical admission to [facility].  Patient was treated with 2 doses of acetaminophen and transferred to [facility] for admission.    Assessment/Plan    Principal Problem:   Closed left hip fracture, initial encounter ([place]) Active Problems:   Late onset Alzheimer's disease without behavioral disturbance (HCC)   Leukocytosis  1. Left hip fracture  - Non-displaced left femoral neck fracture seen on CT  - Based on the available data, Autumn Wallace presents an estimated 0.7% risk of perioperative MI or cardiac arrest  - Keep NPO, continue pain-control  and supportive care  -Pending orthopedic evaluation and surgical invention -Continue as needed IV analgesics   2. Dementia  - Delirium precautions    3. Leukocytosis  - WBC 12,300 on admission without fever or other evidence for infection  - Likely reactive, culture if febrile   -Monitoring closely  Ethics: Patient is full code, due to severe advanced dementia, continue deterioration physically and mentally anticipating palliative care consultation to determine CODE STATUS, and goals of care in near future       ----------------------------------------------------------------------------------------------------------------------------------------------- Nutritional status:  The patient's BMI is: Body mass index is 22.29 kg/m. I agree with the assessment and plan as outlined below: Nutrition Status: Nutrition Problem: Moderate Malnutrition Etiology: chronic illness (dementia) Signs/Symptoms: mild fat depletion, mild muscle depletion Interventions: MVI, Ensure Enlive (each supplement provides 350kcal and 20 grams of protein)     ------------------------------------------------------------------------------------------------------------------------------------------------  DVT prophylaxis:  SCDs Start: 10/12/22 0226   Code Status:   Code Status: Full Code  Family Communication: No family member present at bedside- attempt will be made to update daily The above findings and plan of care has been discussed with patient (and family)  in detail,  they expressed understanding and agreement of above. -Advance care planning  has been discussed.   Admission status:   Status is: Inpatient Remains inpatient appropriate because: Needing surgical invention     Procedures:   No admission procedures for hospital encounter.   Antimicrobials:  Anti-infectives (From admission, onward)    None        Medication:   [START ON 10/13/2022] cholecalciferol  2,000 Units Oral  Daily   divalproex  125 mg Oral BID   [START ON 10/13/2022] docusate sodium  200 mg Oral Daily   escitalopram  10 mg Oral Daily   [START ON 10/13/2022] feeding supplement  237 mL Oral Daily   [START ON 10/13/2022] feeding supplement  296 mL Oral Once   galantamine  16 mg Oral Daily   [START ON 10/13/2022] memantine  10 mg Oral BID   [START ON 10/13/2022] multivitamin with minerals  1 tablet Oral Daily   [START ON 10/13/2022] polyethylene glycol  17 g Oral Daily    acetaminophen, bisacodyl, morphine injection, oxyCODONE, polyethylene glycol   Objective:   Vitals:   10/11/22 2315 10/12/22 0136 10/12/22 0546 10/12/22 0934  BP: (!) 112/56 116/71 (!) 106/92 128/86  Pulse: 61 69 65 (!) 59  Resp: '18 16 20 16  '$ Temp:  98.1 F (36.7 C) 98.3 F (36.8 C) 98.6 F (37 C)  TempSrc:  Oral Oral Oral  SpO2: 96% 90% 99% 99%  Weight:  58.9 kg    Height:       No intake or output data in the 24 hours ending 10/12/22 1403 Filed Weights   10/11/22 1641 10/12/22 0136  Weight: 58.1 kg 58.9 kg     Examination:   Physical Exam  Constitution: Awake, confused  HEENT:        Normocephalic, PERRL, otherwise with in Normal limits  Chest:         Chest symmetric Cardio vascular:  S1/S2, RRR, No murmure, No Rubs or Gallops  pulmonary: Clear to auscultation bilaterally, respirations unlabored, negative wheezes / crackles Abdomen: Soft, non-tender, non-distended, bowel sounds,no masses, no organomegaly Muscular skeletal: Limited exam - in bed, able to move all 4 extremities, Complaining of left leg, hip pain, limited range of motion on the left lower extremity due to pain Neuro: CNII-XII intact. , normal motor and sensation, reflexes intact  Extremities: No pitting edema lower extremities, +2 pulses  Skin: Dry, warm to touch, negative for any Rashes, No open wounds Wounds: per nursing  documentation   ------------------------------------------------------------------------------------------------------------------------------------------    LABs:     Latest Ref Rng & Units 10/12/2022    6:42 AM 10/11/2022    5:16 PM 05/18/2022    3:56 AM  CBC  WBC 4.0 - 10.5 K/uL 8.7  12.3  11.4   Hemoglobin 12.0 - 15.0 g/dL 12.7  12.8  11.6   Hematocrit 36.0 - 46.0 % 38.6  40.2  35.8   Platelets 150 - 400 K/uL 214  233  292       Latest Ref Rng & Units 10/12/2022    6:42 AM 10/11/2022    5:16 PM 05/18/2022    3:56 AM  CMP  Glucose 70 - 99 mg/dL 121  123  91   BUN 8 - 23 mg/dL '17  24  19   '$ Creatinine 0.44 - 1.00 mg/dL 0.85  1.00  0.92   Sodium 135 - 145 mmol/L 140  138  139   Potassium 3.5 - 5.1 mmol/L 4.1  4.7  4.2   Chloride 98 - 111 mmol/L 108  103  105   CO2 22 - 32 mmol/L '25  27  26   '$ Calcium 8.9 - 10.3 mg/dL 8.8  9.8  9.0   Total Protein 6.5 - 8.1 g/dL   5.9   Total Bilirubin 0.3 - 1.2 mg/dL   0.8   Alkaline Phos 38 - 126 U/L   86   AST 15 - 41 U/L   18   ALT 0 - 44 U/L   17        Micro Results No results found for this or any previous visit (from the past 240 hour(s)).  Radiology Reports CT PELVIS WO CONTRAST  Result Date: 10/11/2022 CLINICAL DATA:  Fall EXAM: CT PELVIS WITHOUT CONTRAST TECHNIQUE: Multidetector CT imaging of the pelvis was performed following the standard protocol without intravenous contrast. RADIATION DOSE REDUCTION: This exam was performed according to the departmental dose-optimization program which includes automated exposure control, adjustment of the mA and/or kV according to patient size and/or use of iterative reconstruction technique. COMPARISON:  Pelvic radiograph 10/11/2022 FINDINGS: Urinary Tract:  The bladder is unremarkable Bowel: Diverticular disease of the sigmoid colon without acute bowel wall thickening. Vascular/Lymphatic: No suspicious lymph nodes. Moderate atherosclerosis without aneurysm Reproductive:  Uterus unremarkable.   No adnexal mass Other:  Negative for pelvic effusion. Musculoskeletal: Pubic symphysis is intact. Old right inferior pubic ramus fracture. No dislocation. Mild to moderate arthritis of the bilateral hips. Acute nondisplaced fracture involving the left femoral neck, coronal series 7, image 75, axial series 3 image 88 through 97 and sagittal series 6 image 39-41. IMPRESSION: Acute nondisplaced left femoral neck fracture. Diverticular disease of sigmoid colon without acute inflammation Electronically Signed   By: Donavan Foil M.D.   On: 10/11/2022 21:49   DG Knee Complete 4 Views Left  Result Date: 10/11/2022 CLINICAL DATA:  Leg pain after fall; has advanced dementia EXAM: LEFT KNEE - COMPLETE 4+ VIEW; PELVIS - 1-2 VIEW; LEFT FEMUR 2 VIEWS COMPARISON:  Radiographs 10/10/2022 FINDINGS: Demineralization. Irregularity about the right inferior pubic ramus is unchanged from 10/10/2022 may represent a old fracture. Otherwise no definite fracture or dislocation in the hips or pelvis. No acute fracture in the femur. No acute fracture or dislocation of the left knee. No knee joint effusion. Tricompartmental degenerative arthritis. Patellar enthesophytes. IMPRESSION: No definite acute fracture in the pelvis, hips, left femur or left knee. Electronically Signed   By: Placido Sou M.D.   On: 10/11/2022 19:03   DG FEMUR MIN 2 VIEWS LEFT  Result Date: 10/11/2022 CLINICAL DATA:  Leg pain after fall; has advanced dementia EXAM: LEFT KNEE - COMPLETE 4+ VIEW; PELVIS - 1-2 VIEW; LEFT FEMUR 2 VIEWS COMPARISON:  Radiographs 10/10/2022 FINDINGS: Demineralization. Irregularity about the right inferior pubic ramus is unchanged from 10/10/2022 may represent a old fracture. Otherwise no definite fracture or dislocation in the hips or pelvis. No acute fracture in the femur. No acute fracture or dislocation of the left knee. No knee joint effusion. Tricompartmental degenerative arthritis. Patellar enthesophytes. IMPRESSION: No  definite acute fracture in the pelvis, hips, left femur or left knee. Electronically Signed   By: Placido Sou M.D.   On: 10/11/2022 19:03   DG Pelvis 1-2 Views  Result Date: 10/11/2022 CLINICAL DATA:  Leg pain after fall; has advanced dementia EXAM: LEFT KNEE - COMPLETE 4+ VIEW; PELVIS - 1-2 VIEW; LEFT FEMUR 2 VIEWS COMPARISON:  Radiographs 10/10/2022 FINDINGS: Demineralization. Irregularity about the right inferior pubic ramus is unchanged from 10/10/2022 may represent a old fracture. Otherwise no  definite fracture or dislocation in the hips or pelvis. No acute fracture in the femur. No acute fracture or dislocation of the left knee. No knee joint effusion. Tricompartmental degenerative arthritis. Patellar enthesophytes. IMPRESSION: No definite acute fracture in the pelvis, hips, left femur or left knee. Electronically Signed   By: Placido Sou M.D.   On: 10/11/2022 19:03    SIGNED: Deatra James, MD, FHM. [facility],  Pager (please use [email or web address] to page/text) Please use Epic Secure Chat for non-urgent communication (7AM-7PM)  If 7PM-7AM, please contact night-coverage [email or web address], 10/12/2022, 2:03 PM

## 2022-10-12 NOTE — Plan of Care (Signed)

## 2022-10-12 NOTE — Progress Notes (Signed)
Initial Nutrition Assessment  DOCUMENTATION CODES:   Non-severe (moderate) malnutrition in context of chronic illness  INTERVENTION:   -Ensure Plus High Protein po BID, each supplement provides 350 kcal and 20 grams of protein  -Multivitamin with minerals daily  NUTRITION DIAGNOSIS:   Moderate Malnutrition related to chronic illness (dementia) as evidenced by mild fat depletion, mild muscle depletion.  GOAL:   Patient will meet greater than or equal to 90% of their needs  MONITOR:   Diet advancement, Weight trends, I & O's, Labs  REASON FOR ASSESSMENT:   Consult Hip fracture protocol  ASSESSMENT:   88 y.o. female with medical history significant for advanced dementia and paroxysmal SVT who presents to the emergency department with left hip pain after fall.  Patient in room, no family at bedside. Pt disoriented and not able to provide any history. Tends to mumble when asked questions. Pt currently NPO awaiting hip surgery. Noted that surgery is now planned for 11/8. Ensure supplements have been ordered in anticipation of diet advancement. Will add daily MVI as well.   Per weight records, no weight loss noted.  Medications: Miralax  Labs reviewed: CBGs: 88   NUTRITION - FOCUSED PHYSICAL EXAM:  Flowsheet Row Most Recent Value  Orbital Region Moderate depletion  Upper Arm Region Mild depletion  Thoracic and Lumbar Region Mild depletion  Buccal Region Mild depletion  Temple Region Mild depletion  Clavicle Bone Region Mild depletion  Clavicle and Acromion Bone Region Mild depletion  Scapular Bone Region Unable to assess  Dorsal Hand No depletion  Patellar Region Mild depletion  Anterior Thigh Region Mild depletion  Posterior Calf Region Unable to assess  Edema (RD Assessment) None  Hair Reviewed  [thin]  Eyes Unable to assess  [didn't keep eyes open]  Mouth Reviewed  Skin Reviewed  Nails Reviewed       Diet Order:   Diet Order             Diet NPO  time specified Except for: Sips with Meds, Ice Chips  Diet effective now                   EDUCATION NEEDS:   Not appropriate for education at this time  Skin:  Skin Assessment: Reviewed RN Assessment  Last BM:  PTA  Height:   Ht Readings from Last 1 Encounters:  10/11/22 '5\' 4"'$  (1.626 m)    Weight:   Wt Readings from Last 1 Encounters:  10/12/22 58.9 kg   BMI:  Body mass index is 22.29 kg/m.  Estimated Nutritional Needs:   Kcal:  1550-1750  Protein:  70-85g  Fluid:  1.7L/day  Clayton Bibles, MS, RD, LDN Inpatient Clinical Dietitian Contact information available via Amion

## 2022-10-12 NOTE — Progress Notes (Signed)
Connected patient to bedside continuous pulse ox monitor. 

## 2022-10-12 NOTE — Consult Note (Signed)
History:  Autumn Wallace is a 88 y.o. female with medical history significant for advanced dementia and paroxysmal SVT who presents to the emergency department with left hip pain after fall.  Patient had a witnessed fall at her facility when a chair that she was holding onto tipped over and she fell onto her left hip and elbow without hitting her head or losing consciousness.  She was unable to bear weight after this and was brought into the ED.   [facility] ED Course: Upon arrival to the ED, patient is found to be afebrile and saturating well on room air initially with stable blood pressure.  Plain radiographs of the hip were negative for definite acute fracture but CT demonstrates nondisplaced acute left femoral neck fracture.  As a result she was transferred to [facility] for definitive fracture management  Past Medical History:  Diagnosis Date   Acute pancreatitis    Adenomatous colon polyp 12/06/2002   Allergy    seasonal   Dementia (HCC)    Diverticulosis    Gallstones    Hiatal hernia    Hyperlipidemia    Pancreatitis     Allergies  Allergen Reactions   Fosamax [Alendronate] Other (See Comments)    Unknown reaction Listed as allergy on MAR   Latex Other (See Comments)    Unknown reaction Listed as allergy on MAR   Tetanus Toxoids Other (See Comments)    Unknown reaction Listed as allergy on MAR   Vibra-Tab [Doxycycline] Other (See Comments)    Unknown reaction Listed as allergy on MAR    No current facility-administered medications on file prior to encounter.   Current Outpatient Medications on File Prior to Encounter  Medication Sig Dispense Refill   acetaminophen (TYLENOL) 325 MG tablet Take 2 tablets (650 mg total) by mouth every 6 (six) hours as needed for up to 30 doses for moderate pain or mild pain. 30 tablet 1   cholecalciferol (VITAMIN D3) 25 MCG (1000 UNIT) tablet Take 2,000 Units by mouth daily.     Cranberry-Vitamin C-Vitamin E (CRANBERRY  PLUS VITAMIN C) 4200-20-3 MG-MG-UNIT CAPS Take 1 capsule by mouth daily.     divalproex (DEPAKOTE SPRINKLE) 125 MG capsule Take 125 mg by mouth 2 (two) times daily.     docusate sodium (COLACE) 100 MG capsule Take 200 mg by mouth daily.     Ensure (ENSURE) Take 237 mLs by mouth daily. May also drink an additional 237 ml by mouth as needed after dinner if the resident does not eat a full meal     escitalopram (LEXAPRO) 10 MG tablet Take 10 mg by mouth daily.     fluticasone (FLONASE) 50 MCG/ACT nasal spray Place 1 spray into both nostrils in the morning and at bedtime.     galantamine (RAZADYNE ER) 16 MG 24 hr capsule Take 16 mg by mouth daily.     memantine (NAMENDA) 10 MG tablet Take 10 mg by mouth 2 (two) times daily.     polyethylene glycol powder (GLYCOLAX/MIRALAX) 17 GM/SCOOP powder Take 17 g by mouth daily.     ipratropium (ATROVENT) 0.06 % nasal spray Place 1 spray into both nostrils 3 (three) times daily. (Patient not taking: Reported on 10/12/2022)     Multiple Vitamins-Minerals (CENTRUM SILVER 50+WOMEN PO) Take 1 tablet by mouth daily. (Patient not taking: Reported on 10/12/2022)     vitamin B-12 (CYANOCOBALAMIN) 500 MCG tablet Take 500 mcg by mouth daily. (Patient not taking: Reported on  10/12/2022)      Physical Exam: Vitals:   10/12/22 0934 10/12/22 1451  BP: 128/86 (!) 142/61  Pulse: (!) 59 66  Resp: 16 16  Temp: 98.6 F (37 C) 98.1 F (36.7 C)  SpO2: 99% 99%   Body mass index is 22.29 kg/m. Constitutional: NAD, calm  Eyes: PERTLA, lids and conjunctivae normal ENMT: Mucous membranes are moist. Posterior pharynx clear of any exudate or lesions.   Neck: supple, no masses  Respiratory: no wheezing, no crackles. No accessory muscle use.  Cardiovascular: S1 & S2 heard, regular rate and rhythm. No extremity edema.  Abdomen: No distension, no tenderness, soft. Bowel sounds active.  Musculoskeletal: no clubbing / cyanosis. Tender left hip, neurovascularly intact.  Pain at the  left hip with palpation. Compartements soft/NT 2+DP\PT Skin: no significant rashes, lesions, ulcers. Warm, dry, well-perfused. Neurologic: CN 2-12 grossly intact. Moving all extremities. Wakes to voice. Disoriented.   Psychiatric: Calm. Cooperative.   Image: CT PELVIS WO CONTRAST  Result Date: 10/11/2022 CLINICAL DATA:  Fall EXAM: CT PELVIS WITHOUT CONTRAST TECHNIQUE: Multidetector CT imaging of the pelvis was performed following the standard protocol without intravenous contrast. RADIATION DOSE REDUCTION: This exam was performed according to the departmental dose-optimization program which includes automated exposure control, adjustment of the mA and/or kV according to patient size and/or use of iterative reconstruction technique. COMPARISON:  Pelvic radiograph 10/11/2022 FINDINGS: Urinary Tract:  The bladder is unremarkable Bowel: Diverticular disease of the sigmoid colon without acute bowel wall thickening. Vascular/Lymphatic: No suspicious lymph nodes. Moderate atherosclerosis without aneurysm Reproductive:  Uterus unremarkable.  No adnexal mass Other:  Negative for pelvic effusion. Musculoskeletal: Pubic symphysis is intact. Old right inferior pubic ramus fracture. No dislocation. Mild to moderate arthritis of the bilateral hips. Acute nondisplaced fracture involving the left femoral neck, coronal series 7, image 75, axial series 3 image 88 through 97 and sagittal series 6 image 39-41. IMPRESSION: Acute nondisplaced left femoral neck fracture. Diverticular disease of sigmoid colon without acute inflammation Electronically Signed   By: Donavan Foil M.D.   On: 10/11/2022 21:49   DG Knee Complete 4 Views Left  Result Date: 10/11/2022 CLINICAL DATA:  Leg pain after fall; has advanced dementia EXAM: LEFT KNEE - COMPLETE 4+ VIEW; PELVIS - 1-2 VIEW; LEFT FEMUR 2 VIEWS COMPARISON:  Radiographs 10/10/2022 FINDINGS: Demineralization. Irregularity about the right inferior pubic ramus is unchanged from  10/10/2022 may represent a old fracture. Otherwise no definite fracture or dislocation in the hips or pelvis. No acute fracture in the femur. No acute fracture or dislocation of the left knee. No knee joint effusion. Tricompartmental degenerative arthritis. Patellar enthesophytes. IMPRESSION: No definite acute fracture in the pelvis, hips, left femur or left knee. Electronically Signed   By: Placido Sou M.D.   On: 10/11/2022 19:03   DG FEMUR MIN 2 VIEWS LEFT  Result Date: 10/11/2022 CLINICAL DATA:  Leg pain after fall; has advanced dementia EXAM: LEFT KNEE - COMPLETE 4+ VIEW; PELVIS - 1-2 VIEW; LEFT FEMUR 2 VIEWS COMPARISON:  Radiographs 10/10/2022 FINDINGS: Demineralization. Irregularity about the right inferior pubic ramus is unchanged from 10/10/2022 may represent a old fracture. Otherwise no definite fracture or dislocation in the hips or pelvis. No acute fracture in the femur. No acute fracture or dislocation of the left knee. No knee joint effusion. Tricompartmental degenerative arthritis. Patellar enthesophytes. IMPRESSION: No definite acute fracture in the pelvis, hips, left femur or left knee. Electronically Signed   By: Placido Sou M.D.   On: 10/11/2022  19:03   DG Pelvis 1-2 Views  Result Date: 10/11/2022 CLINICAL DATA:  Leg pain after fall; has advanced dementia EXAM: LEFT KNEE - COMPLETE 4+ VIEW; PELVIS - 1-2 VIEW; LEFT FEMUR 2 VIEWS COMPARISON:  Radiographs 10/10/2022 FINDINGS: Demineralization. Irregularity about the right inferior pubic ramus is unchanged from 10/10/2022 may represent a old fracture. Otherwise no definite fracture or dislocation in the hips or pelvis. No acute fracture in the femur. No acute fracture or dislocation of the left knee. No knee joint effusion. Tricompartmental degenerative arthritis. Patellar enthesophytes. IMPRESSION: No definite acute fracture in the pelvis, hips, left femur or left knee. Electronically Signed   By: Placido Sou M.D.   On: 10/11/2022  19:03   DG Chest Portable 1 View  Result Date: 10/10/2022 CLINICAL DATA:  Fall EXAM: PORTABLE CHEST 1 VIEW COMPARISON:  05/17/2022 and prior studies FINDINGS: The cardiomediastinal silhouette is unchanged. A hiatal hernia is again noted. There is no evidence of focal airspace disease, pulmonary edema, suspicious pulmonary nodule/mass, pleural effusion, or pneumothorax. Minimal LEFT basilar atelectasis/scarring noted. No acute bony abnormalities are identified. Remote rib and RIGHT clavicle fractures again identified. IMPRESSION: 1. No acute disease. 2. Hiatal hernia. Electronically Signed   By: Margarette Canada M.D.   On: 10/10/2022 10:19   CT Head Wo Contrast  Result Date: 10/10/2022 CLINICAL DATA:  Provided history: Neck trauma. Head trauma, minor. Unwitnessed fall. EXAM: CT HEAD WITHOUT CONTRAST CT CERVICAL SPINE WITHOUT CONTRAST TECHNIQUE: Multidetector CT imaging of the head and cervical spine was performed following the standard protocol without intravenous contrast. Multiplanar CT image reconstructions of the cervical spine were also generated. RADIATION DOSE REDUCTION: This exam was performed according to the departmental dose-optimization program which includes automated exposure control, adjustment of the mA and/or kV according to patient size and/or use of iterative reconstruction technique. COMPARISON:  Brain MRI 05/17/2022. Head CT 05/17/2022. CT cervical spine 11/05/2021. FINDINGS: CT HEAD FINDINGS Brain: Moderate cerebral atrophy. Mild patchy and ill-defined hypoattenuation within the cerebral white matter, nonspecific but compatible with chronic small ischemic disease. There is no acute intracranial hemorrhage. No demarcated cortical infarct. No extra-axial fluid collection. No evidence of an intracranial mass. No midline shift. Vascular: No hyperdense vessel.  Atherosclerotic calcifications. Skull: No fracture or aggressive osseous lesion. Sinuses/Orbits: No mass or acute finding within the  imaged orbits. No significant paranasal sinus disease at the imaged levels. CT CERVICAL SPINE FINDINGS Alignment: No significant spondylolisthesis. Skull base and vertebrae: Cervical vertebral body height is maintained. No evidence of acute fracture to the cervical spine. Subtle chronic T1 anterior wedge vertebral compression deformity. Mild T3 superior endplate compression deformity. These compression deformities are unchanged as compared to the prior cervical spine CT of 11/05/2021. Soft tissues and spinal canal: No prevertebral soft tissue swelling or visible canal hematoma. Multinodular thyroid gland, previously assessed by ultrasound in 2012. No follow-up imaging is recommended. Disc levels: Mild for age cervical spondylosis. No appreciable high-grade spinal canal stenosis. No high-grade bony neural foraminal narrowing. Osseous fusion across the disc space at T3-T4. Upper chest: No consolidation within the imaged lung apices. No visible pneumothorax. IMPRESSION: CT head: 1. No evidence of acute intracranial abnormality. 2. Mild chronic small vessel changes within the cerebral white matter. 3. Moderate generalized cerebral atrophy. CT cervical spine: 1. No evidence of acute fracture to the cervical spine. 2. Mild chronic T1 and T3 vertebral compression deformities, unchanged from the prior cervical spine CT of 11/05/2021. 3. Mild for age cervical spondylosis. Electronically Signed   By:  Kellie Simmering D.O.   On: 10/10/2022 10:18   CT Cervical Spine Wo Contrast  Result Date: 10/10/2022 CLINICAL DATA:  Provided history: Neck trauma. Head trauma, minor. Unwitnessed fall. EXAM: CT HEAD WITHOUT CONTRAST CT CERVICAL SPINE WITHOUT CONTRAST TECHNIQUE: Multidetector CT imaging of the head and cervical spine was performed following the standard protocol without intravenous contrast. Multiplanar CT image reconstructions of the cervical spine were also generated. RADIATION DOSE REDUCTION: This exam was performed according  to the departmental dose-optimization program which includes automated exposure control, adjustment of the mA and/or kV according to patient size and/or use of iterative reconstruction technique. COMPARISON:  Brain MRI 05/17/2022. Head CT 05/17/2022. CT cervical spine 11/05/2021. FINDINGS: CT HEAD FINDINGS Brain: Moderate cerebral atrophy. Mild patchy and ill-defined hypoattenuation within the cerebral white matter, nonspecific but compatible with chronic small ischemic disease. There is no acute intracranial hemorrhage. No demarcated cortical infarct. No extra-axial fluid collection. No evidence of an intracranial mass. No midline shift. Vascular: No hyperdense vessel.  Atherosclerotic calcifications. Skull: No fracture or aggressive osseous lesion. Sinuses/Orbits: No mass or acute finding within the imaged orbits. No significant paranasal sinus disease at the imaged levels. CT CERVICAL SPINE FINDINGS Alignment: No significant spondylolisthesis. Skull base and vertebrae: Cervical vertebral body height is maintained. No evidence of acute fracture to the cervical spine. Subtle chronic T1 anterior wedge vertebral compression deformity. Mild T3 superior endplate compression deformity. These compression deformities are unchanged as compared to the prior cervical spine CT of 11/05/2021. Soft tissues and spinal canal: No prevertebral soft tissue swelling or visible canal hematoma. Multinodular thyroid gland, previously assessed by ultrasound in 2012. No follow-up imaging is recommended. Disc levels: Mild for age cervical spondylosis. No appreciable high-grade spinal canal stenosis. No high-grade bony neural foraminal narrowing. Osseous fusion across the disc space at T3-T4. Upper chest: No consolidation within the imaged lung apices. No visible pneumothorax. IMPRESSION: CT head: 1. No evidence of acute intracranial abnormality. 2. Mild chronic small vessel changes within the cerebral white matter. 3. Moderate generalized  cerebral atrophy. CT cervical spine: 1. No evidence of acute fracture to the cervical spine. 2. Mild chronic T1 and T3 vertebral compression deformities, unchanged from the prior cervical spine CT of 11/05/2021. 3. Mild for age cervical spondylosis. Electronically Signed   By: Kellie Simmering D.O.   On: 10/10/2022 10:18   DG Pelvis 1-2 Views  Result Date: 10/10/2022 CLINICAL DATA:  Fall today.  Pelvic pain. EXAM: PELVIS - 1-2 VIEW COMPARISON:  None Available. FINDINGS: Nondisplaced fracture involving the right inferior pubic ramus is seen which is of indeterminate age radiographically. No other acute fractures identified. No evidence of pelvic diastasis. Moderate right and mild left hip osteoarthritis noted. IMPRESSION: Nondisplaced fracture of the right inferior pubic ramus, of indeterminate age radiographically. Bilateral hip osteoarthritis. Electronically Signed   By: Marlaine Hind M.D.   On: 10/10/2022 10:14    Assessment/Plan    1. Left hip fracture  - Non-displaced left femoral neck fracture seen on CT  - Per medical team she presents an estimated 0.7% risk of perioperative MI or cardiac arrest  - Keep NPO, continue pain-control and supportive care  Dr Lyla Glassing to take to OR on 11/8 for definitive fracture management   2. Dementia  - Delirium precautions per medical team

## 2022-10-12 NOTE — Progress Notes (Signed)
Patient is alert but confused, she rambles when spoken to. Patient is currently bedbound and awaiting surgical intervention to the Left hip. Pt is incontinent of bowel and bladder, purewick is in place.

## 2022-10-12 NOTE — H&P (Signed)
History and Physical    Autumn Wallace DVV:[id number] DOB: 1932/03/21 DOA: 10/11/2022  PCP: Crist Infante, MD   Patient coming from: [facility]   Chief Complaint: Fall, left leg pain   HPI: Autumn Wallace is a 88 y.o. female with medical history significant for advanced dementia and paroxysmal SVT who presents to the emergency department with left hip pain after fall.  Patient had a witnessed fall at her facility when a chair that she was holding onto tipped over and she fell onto her left hip and elbow without hitting her head or losing consciousness.  She was unable to bear weight after this and was brought into the ED.  [place] ED Course: Upon arrival to the ED, patient is found to be afebrile and saturating well on room air initially with stable blood pressure.  Plain radiographs of the hip were negative for definite acute fracture but CT demonstrates nondisplaced acute left femoral neck fracture.  Blood work notable for WBC 12,300.    Orthopedic surgery (Dr. Rolena Infante) was consulted by the ED physician and recommended medical admission to [facility].  Patient was treated with 2 doses of acetaminophen and transferred to [facility] for admission.  Review of Systems:  ROS limited by patient's clinical condition.  Past Medical History:  Diagnosis Date   Acute pancreatitis    Adenomatous colon polyp 12/06/2002   Allergy    seasonal   Dementia ([place])    Diverticulosis    Gallstones    Hiatal hernia    Hyperlipidemia    Pancreatitis     Past Surgical History:  Procedure Laterality Date   CHOLECYSTECTOMY  2012   COLONOSCOPY     DILATION AND CURETTAGE OF UTERUS     TONSILLECTOMY AND ADENOIDECTOMY      Social History:   reports that she has never smoked. She has never used smokeless tobacco. She reports that she does not drink alcohol and does not use drugs.  Allergies  Allergen Reactions   Fosamax [Alendronate] Other (See  Comments)    Unknown reaction Listed as allergy on MAR   Latex Other (See Comments)    Unknown reaction Listed as allergy on MAR   Tetanus Toxoids Other (See Comments)    Unknown reaction Listed as allergy on MAR   Vibra-Tab [Doxycycline] Other (See Comments)    Unknown reaction Listed as allergy on MAR    Family History  Problem Relation Age of Onset   Breast cancer Sister    Colon cancer Neg Hx      Prior to Admission medications   Medication Sig Start Date End Date Taking? Authorizing Provider  acetaminophen (TYLENOL) 325 MG tablet Take 2 tablets (650 mg total) by mouth every 6 (six) hours as needed for up to 30 doses for moderate pain or mild pain. Patient taking differently: Take 650 mg by mouth every 8 (eight) hours as needed for moderate pain or mild pain. 03/05/22   Wyvonnia Dusky, MD  cholecalciferol (VITAMIN D3) 25 MCG (1000 UNIT) tablet Take 2,000 Units by mouth daily.    [provider]  Cranberry-Vitamin C-Vitamin E (CRANBERRY PLUS VITAMIN C PO) Take 1 capsule by mouth daily. Cranberry 4200 mg with Vitamin C (unknown strength)    [provider]  docusate sodium (COLACE) 100 MG capsule Take 200 mg by mouth daily.    [provider]  Ensure (ENSURE) Take 237 mLs by mouth daily.    [provider]  escitalopram (LEXAPRO) 10 MG tablet Take 10 mg by mouth daily. 08/12/19   [provider]  ipratropium (ATROVENT) 0.06 % nasal spray Place 1 spray into both nostrils 3 (three) times daily.    [provider]  memantine (NAMENDA) 10 MG tablet Take 10 mg by mouth 2 (two) times daily. 08/07/19   [provider]  Multiple Vitamins-Minerals (CENTRUM SILVER 50+WOMEN PO) Take 1 tablet by mouth daily.    [provider]  polyethylene glycol powder (GLYCOLAX/MIRALAX) 17 GM/SCOOP powder Take 17 g by mouth daily.    [provider]  vitamin B-12 (CYANOCOBALAMIN) 500 MCG tablet Take 500 mcg by mouth daily.  12/24/21   [provider]    Physical Exam: Vitals:   10/11/22 2300 10/11/22 2310 10/11/22 2315 10/12/22 0136  BP: 109/83  (!) 112/56 116/71  Pulse: 64 61 61 69  Resp: '18  18 16  '$ Temp:    98.1 F (36.7 C)  TempSrc:    Oral  SpO2: (!) 88% 92% 96% 90%  Weight:      Height:        Constitutional: NAD, calm  Eyes: PERTLA, lids and conjunctivae normal ENMT: Mucous membranes are moist. Posterior pharynx clear of any exudate or lesions.   Neck: supple, no masses  Respiratory: no wheezing, no crackles. No accessory muscle use.  Cardiovascular: S1 & S2 heard, regular rate and rhythm. No extremity edema.  Abdomen: No distension, no tenderness, soft. Bowel sounds active.  Musculoskeletal: no clubbing / cyanosis. Tender left hip, neurovascularly intact.   Skin: no significant rashes, lesions, ulcers. Warm, dry, well-perfused. Neurologic: CN 2-12 grossly intact. Moving all extremities. Wakes to voice. Disoriented.   Psychiatric: Calm. Cooperative.    Labs and Imaging on Admission: I have personally reviewed following labs and imaging studies  CBC: Recent Labs  Lab 10/11/22 1716  WBC 12.3*  NEUTROABS 10.0*  HGB 12.8  HCT 40.2  MCV 90.3  PLT 262   Basic Metabolic Panel: Recent Labs  Lab 10/11/22 1716  NA 138  K 4.7  CL 103  CO2 27  GLUCOSE 123*  BUN 24*  CREATININE 1.00  CALCIUM 9.8   GFR: Estimated Creatinine Clearance: 33.6 mL/min (by C-G formula based on SCr of 1 mg/dL). Liver Function Tests: No results for input(s): "AST", "ALT", "ALKPHOS", "BILITOT", "PROT", "ALBUMIN" in the last 168 hours. No results for input(s): "LIPASE", "AMYLASE" in the last 168 hours. No results for input(s): "AMMONIA" in the last 168 hours. Coagulation Profile: No results for input(s): "INR", "PROTIME" in the last 168 hours. Cardiac Enzymes: No results for input(s): "CKTOTAL", "CKMB", "CKMBINDEX", "TROPONINI" in the last 168 hours. BNP (last 3 results) No results for input(s):  "PROBNP" in the last 8760 hours. HbA1C: No results for input(s): "HGBA1C" in the last 72 hours. CBG: Recent Labs  Lab 10/10/22 1048  GLUCAP 88   Lipid Profile: No results for input(s): "CHOL", "HDL", "LDLCALC", "TRIG", "CHOLHDL", "LDLDIRECT" in the last 72 hours. Thyroid Function Tests: No results for input(s): "TSH", "T4TOTAL", "FREET4", "T3FREE", "THYROIDAB" in the last 72 hours. Anemia Panel: No results for input(s): "VITAMINB12", "FOLATE", "FERRITIN", "TIBC", "IRON", "RETICCTPCT" in the last 72 hours. Urine analysis:    Component Value Date/Time   COLORURINE YELLOW 10/11/2022 2009   APPEARANCEUR CLEAR 10/11/2022 2009   LABSPEC 1.020 10/11/2022 2009   PHURINE 7.0 10/11/2022 2009   GLUCOSEU NEGATIVE 10/11/2022 2009   HGBUR NEGATIVE 10/11/2022 2009   BILIRUBINUR NEGATIVE 10/11/2022 2009   KETONESUR NEGATIVE  10/11/2022 2009   PROTEINUR NEGATIVE 10/11/2022 2009   NITRITE NEGATIVE 10/11/2022 2009   LEUKOCYTESUR SMALL (A) 10/11/2022 2009   Sepsis Labs: '@LABRCNTIP'$ (procalcitonin:4,lacticidven:4) )No results found for this or any previous visit (from the past 240 hour(s)).   Radiological Exams on Admission: CT PELVIS WO CONTRAST  Result Date: 10/11/2022 CLINICAL DATA:  Fall EXAM: CT PELVIS WITHOUT CONTRAST TECHNIQUE: Multidetector CT imaging of the pelvis was performed following the standard protocol without intravenous contrast. RADIATION DOSE REDUCTION: This exam was performed according to the departmental dose-optimization program which includes automated exposure control, adjustment of the mA and/or kV according to patient size and/or use of iterative reconstruction technique. COMPARISON:  Pelvic radiograph 10/11/2022 FINDINGS: Urinary Tract:  The bladder is unremarkable Bowel: Diverticular disease of the sigmoid colon without acute bowel wall thickening. Vascular/Lymphatic: No suspicious lymph nodes. Moderate atherosclerosis without aneurysm Reproductive:  Uterus unremarkable.  No  adnexal mass Other:  Negative for pelvic effusion. Musculoskeletal: Pubic symphysis is intact. Old right inferior pubic ramus fracture. No dislocation. Mild to moderate arthritis of the bilateral hips. Acute nondisplaced fracture involving the left femoral neck, coronal series 7, image 75, axial series 3 image 88 through 97 and sagittal series 6 image 39-41. IMPRESSION: Acute nondisplaced left femoral neck fracture. Diverticular disease of sigmoid colon without acute inflammation Electronically Signed   By: Donavan Foil M.D.   On: 10/11/2022 21:49   DG Knee Complete 4 Views Left  Result Date: 10/11/2022 CLINICAL DATA:  Leg pain after fall; has advanced dementia EXAM: LEFT KNEE - COMPLETE 4+ VIEW; PELVIS - 1-2 VIEW; LEFT FEMUR 2 VIEWS COMPARISON:  Radiographs 10/10/2022 FINDINGS: Demineralization. Irregularity about the right inferior pubic ramus is unchanged from 10/10/2022 may represent a old fracture. Otherwise no definite fracture or dislocation in the hips or pelvis. No acute fracture in the femur. No acute fracture or dislocation of the left knee. No knee joint effusion. Tricompartmental degenerative arthritis. Patellar enthesophytes. IMPRESSION: No definite acute fracture in the pelvis, hips, left femur or left knee. Electronically Signed   By: Placido Sou M.D.   On: 10/11/2022 19:03   DG FEMUR MIN 2 VIEWS LEFT  Result Date: 10/11/2022 CLINICAL DATA:  Leg pain after fall; has advanced dementia EXAM: LEFT KNEE - COMPLETE 4+ VIEW; PELVIS - 1-2 VIEW; LEFT FEMUR 2 VIEWS COMPARISON:  Radiographs 10/10/2022 FINDINGS: Demineralization. Irregularity about the right inferior pubic ramus is unchanged from 10/10/2022 may represent a old fracture. Otherwise no definite fracture or dislocation in the hips or pelvis. No acute fracture in the femur. No acute fracture or dislocation of the left knee. No knee joint effusion. Tricompartmental degenerative arthritis. Patellar enthesophytes. IMPRESSION: No definite  acute fracture in the pelvis, hips, left femur or left knee. Electronically Signed   By: Placido Sou M.D.   On: 10/11/2022 19:03   DG Pelvis 1-2 Views  Result Date: 10/11/2022 CLINICAL DATA:  Leg pain after fall; has advanced dementia EXAM: LEFT KNEE - COMPLETE 4+ VIEW; PELVIS - 1-2 VIEW; LEFT FEMUR 2 VIEWS COMPARISON:  Radiographs 10/10/2022 FINDINGS: Demineralization. Irregularity about the right inferior pubic ramus is unchanged from 10/10/2022 may represent a old fracture. Otherwise no definite fracture or dislocation in the hips or pelvis. No acute fracture in the femur. No acute fracture or dislocation of the left knee. No knee joint effusion. Tricompartmental degenerative arthritis. Patellar enthesophytes. IMPRESSION: No definite acute fracture in the pelvis, hips, left femur or left knee. Electronically Signed   By: Carroll Kinds.D.  On: 10/11/2022 19:03   DG Chest Portable 1 View  Result Date: 10/10/2022 CLINICAL DATA:  Fall EXAM: PORTABLE CHEST 1 VIEW COMPARISON:  05/17/2022 and prior studies FINDINGS: The cardiomediastinal silhouette is unchanged. A hiatal hernia is again noted. There is no evidence of focal airspace disease, pulmonary edema, suspicious pulmonary nodule/mass, pleural effusion, or pneumothorax. Minimal LEFT basilar atelectasis/scarring noted. No acute bony abnormalities are identified. Remote rib and RIGHT clavicle fractures again identified. IMPRESSION: 1. No acute disease. 2. Hiatal hernia. Electronically Signed   By: Margarette Canada M.D.   On: 10/10/2022 10:19   CT Head Wo Contrast  Result Date: 10/10/2022 CLINICAL DATA:  Provided history: Neck trauma. Head trauma, minor. Unwitnessed fall. EXAM: CT HEAD WITHOUT CONTRAST CT CERVICAL SPINE WITHOUT CONTRAST TECHNIQUE: Multidetector CT imaging of the head and cervical spine was performed following the standard protocol without intravenous contrast. Multiplanar CT image reconstructions of the cervical spine were also  generated. RADIATION DOSE REDUCTION: This exam was performed according to the departmental dose-optimization program which includes automated exposure control, adjustment of the mA and/or kV according to patient size and/or use of iterative reconstruction technique. COMPARISON:  Brain MRI 05/17/2022. Head CT 05/17/2022. CT cervical spine 11/05/2021. FINDINGS: CT HEAD FINDINGS Brain: Moderate cerebral atrophy. Mild patchy and ill-defined hypoattenuation within the cerebral white matter, nonspecific but compatible with chronic small ischemic disease. There is no acute intracranial hemorrhage. No demarcated cortical infarct. No extra-axial fluid collection. No evidence of an intracranial mass. No midline shift. Vascular: No hyperdense vessel.  Atherosclerotic calcifications. Skull: No fracture or aggressive osseous lesion. Sinuses/Orbits: No mass or acute finding within the imaged orbits. No significant paranasal sinus disease at the imaged levels. CT CERVICAL SPINE FINDINGS Alignment: No significant spondylolisthesis. Skull base and vertebrae: Cervical vertebral body height is maintained. No evidence of acute fracture to the cervical spine. Subtle chronic T1 anterior wedge vertebral compression deformity. Mild T3 superior endplate compression deformity. These compression deformities are unchanged as compared to the prior cervical spine CT of 11/05/2021. Soft tissues and spinal canal: No prevertebral soft tissue swelling or visible canal hematoma. Multinodular thyroid gland, previously assessed by ultrasound in 2012. No follow-up imaging is recommended. Disc levels: Mild for age cervical spondylosis. No appreciable high-grade spinal canal stenosis. No high-grade bony neural foraminal narrowing. Osseous fusion across the disc space at T3-T4. Upper chest: No consolidation within the imaged lung apices. No visible pneumothorax. IMPRESSION: CT head: 1. No evidence of acute intracranial abnormality. 2. Mild chronic small  vessel changes within the cerebral white matter. 3. Moderate generalized cerebral atrophy. CT cervical spine: 1. No evidence of acute fracture to the cervical spine. 2. Mild chronic T1 and T3 vertebral compression deformities, unchanged from the prior cervical spine CT of 11/05/2021. 3. Mild for age cervical spondylosis. Electronically Signed   By: Kellie Simmering D.O.   On: 10/10/2022 10:18   CT Cervical Spine Wo Contrast  Result Date: 10/10/2022 CLINICAL DATA:  Provided history: Neck trauma. Head trauma, minor. Unwitnessed fall. EXAM: CT HEAD WITHOUT CONTRAST CT CERVICAL SPINE WITHOUT CONTRAST TECHNIQUE: Multidetector CT imaging of the head and cervical spine was performed following the standard protocol without intravenous contrast. Multiplanar CT image reconstructions of the cervical spine were also generated. RADIATION DOSE REDUCTION: This exam was performed according to the departmental dose-optimization program which includes automated exposure control, adjustment of the mA and/or kV according to patient size and/or use of iterative reconstruction technique. COMPARISON:  Brain MRI 05/17/2022. Head CT 05/17/2022. CT cervical spine 11/05/2021.  FINDINGS: CT HEAD FINDINGS Brain: Moderate cerebral atrophy. Mild patchy and ill-defined hypoattenuation within the cerebral white matter, nonspecific but compatible with chronic small ischemic disease. There is no acute intracranial hemorrhage. No demarcated cortical infarct. No extra-axial fluid collection. No evidence of an intracranial mass. No midline shift. Vascular: No hyperdense vessel.  Atherosclerotic calcifications. Skull: No fracture or aggressive osseous lesion. Sinuses/Orbits: No mass or acute finding within the imaged orbits. No significant paranasal sinus disease at the imaged levels. CT CERVICAL SPINE FINDINGS Alignment: No significant spondylolisthesis. Skull base and vertebrae: Cervical vertebral body height is maintained. No evidence of acute fracture  to the cervical spine. Subtle chronic T1 anterior wedge vertebral compression deformity. Mild T3 superior endplate compression deformity. These compression deformities are unchanged as compared to the prior cervical spine CT of 11/05/2021. Soft tissues and spinal canal: No prevertebral soft tissue swelling or visible canal hematoma. Multinodular thyroid gland, previously assessed by ultrasound in 2012. No follow-up imaging is recommended. Disc levels: Mild for age cervical spondylosis. No appreciable high-grade spinal canal stenosis. No high-grade bony neural foraminal narrowing. Osseous fusion across the disc space at T3-T4. Upper chest: No consolidation within the imaged lung apices. No visible pneumothorax. IMPRESSION: CT head: 1. No evidence of acute intracranial abnormality. 2. Mild chronic small vessel changes within the cerebral white matter. 3. Moderate generalized cerebral atrophy. CT cervical spine: 1. No evidence of acute fracture to the cervical spine. 2. Mild chronic T1 and T3 vertebral compression deformities, unchanged from the prior cervical spine CT of 11/05/2021. 3. Mild for age cervical spondylosis. Electronically Signed   By: Kellie Simmering D.O.   On: 10/10/2022 10:18   DG Pelvis 1-2 Views  Result Date: 10/10/2022 CLINICAL DATA:  Fall today.  Pelvic pain. EXAM: PELVIS - 1-2 VIEW COMPARISON:  None Available. FINDINGS: Nondisplaced fracture involving the right inferior pubic ramus is seen which is of indeterminate age radiographically. No other acute fractures identified. No evidence of pelvic diastasis. Moderate right and mild left hip osteoarthritis noted. IMPRESSION: Nondisplaced fracture of the right inferior pubic ramus, of indeterminate age radiographically. Bilateral hip osteoarthritis. Electronically Signed   By: Marlaine Hind M.D.   On: 10/10/2022 10:14    EKG: Independently reviewed. SR, occasional PVC.   Assessment/Plan   1. Left hip fracture  - Non-displaced left femoral neck  fracture seen on CT  - Based on the available data, Mrs. Sheriff presents an estimated 0.7% risk of perioperative MI or cardiac arrest  - Keep NPO, continue pain-control and supportive care   2. Dementia  - Delirium precautions   3. Leukocytosis  - WBC 12,300 on admission without fever or other evidence for infection  - Likely reactive, culture if febrile    DVT prophylaxis: SCDs  Code Status: Full  Level of Care: Level of care: Telemetry Family Communication: Daughter updated from ED  Disposition Plan:  Patient is from: [facility]  Anticipated d/c is to: SNF  Anticipated d/c date is: 10/15/22  Patient currently: Pending orthopedic surgery consult and likely operative hip repair  Consults called: Orthopedic surgery  Admission status: Inpatient     Vianne Bulls, MD [facility]  10/12/2022, 2:28 AM

## 2022-10-12 NOTE — H&P (View-Only) (Signed)
History:  Autumn Wallace is a 88 y.o. female with medical history significant for advanced dementia and paroxysmal SVT who presents to the emergency department with left hip pain after fall.  Patient had a witnessed fall at her facility when a chair that she was holding onto tipped over and she fell onto her left hip and elbow without hitting her head or losing consciousness.  She was unable to bear weight after this and was brought into the ED.   [place] ED Course: Upon arrival to the ED, patient is found to be afebrile and saturating well on room air initially with stable blood pressure.  Plain radiographs of the hip were negative for definite acute fracture but CT demonstrates nondisplaced acute left femoral neck fracture.  As a result she was transferred to [facility] for definitive fracture management  Past Medical History:  Diagnosis Date   Acute pancreatitis    Adenomatous colon polyp 12/06/2002   Allergy    seasonal   Dementia (HCC)    Diverticulosis    Gallstones    Hiatal hernia    Hyperlipidemia    Pancreatitis     Allergies  Allergen Reactions   Fosamax [Alendronate] Other (See Comments)    Unknown reaction Listed as allergy on MAR   Latex Other (See Comments)    Unknown reaction Listed as allergy on MAR   Tetanus Toxoids Other (See Comments)    Unknown reaction Listed as allergy on MAR   Vibra-Tab [Doxycycline] Other (See Comments)    Unknown reaction Listed as allergy on MAR    No current facility-administered medications on file prior to encounter.   Current Outpatient Medications on File Prior to Encounter  Medication Sig Dispense Refill   acetaminophen (TYLENOL) 325 MG tablet Take 2 tablets (650 mg total) by mouth every 6 (six) hours as needed for up to 30 doses for moderate pain or mild pain. 30 tablet 1   cholecalciferol (VITAMIN D3) 25 MCG (1000 UNIT) tablet Take 2,000 Units by mouth daily.     Cranberry-Vitamin C-Vitamin E (CRANBERRY  PLUS VITAMIN C) 4200-20-3 MG-MG-UNIT CAPS Take 1 capsule by mouth daily.     divalproex (DEPAKOTE SPRINKLE) 125 MG capsule Take 125 mg by mouth 2 (two) times daily.     docusate sodium (COLACE) 100 MG capsule Take 200 mg by mouth daily.     Ensure (ENSURE) Take 237 mLs by mouth daily. May also drink an additional 237 ml by mouth as needed after dinner if the resident does not eat a full meal     escitalopram (LEXAPRO) 10 MG tablet Take 10 mg by mouth daily.     fluticasone (FLONASE) 50 MCG/ACT nasal spray Place 1 spray into both nostrils in the morning and at bedtime.     galantamine (RAZADYNE ER) 16 MG 24 hr capsule Take 16 mg by mouth daily.     memantine (NAMENDA) 10 MG tablet Take 10 mg by mouth 2 (two) times daily.     polyethylene glycol powder (GLYCOLAX/MIRALAX) 17 GM/SCOOP powder Take 17 g by mouth daily.     ipratropium (ATROVENT) 0.06 % nasal spray Place 1 spray into both nostrils 3 (three) times daily. (Patient not taking: Reported on 10/12/2022)     Multiple Vitamins-Minerals (CENTRUM SILVER 50+WOMEN PO) Take 1 tablet by mouth daily. (Patient not taking: Reported on 10/12/2022)     vitamin B-12 (CYANOCOBALAMIN) 500 MCG tablet Take 500 mcg by mouth daily. (Patient not taking: Reported on  10/12/2022)      Physical Exam: Vitals:   10/12/22 0934 10/12/22 1451  BP: 128/86 (!) 142/61  Pulse: (!) 59 66  Resp: 16 16  Temp: 98.6 F (37 C) 98.1 F (36.7 C)  SpO2: 99% 99%   Body mass index is 22.29 kg/m. Constitutional: NAD, calm  Eyes: PERTLA, lids and conjunctivae normal ENMT: Mucous membranes are moist. Posterior pharynx clear of any exudate or lesions.   Neck: supple, no masses  Respiratory: no wheezing, no crackles. No accessory muscle use.  Cardiovascular: S1 & S2 heard, regular rate and rhythm. No extremity edema.  Abdomen: No distension, no tenderness, soft. Bowel sounds active.  Musculoskeletal: no clubbing / cyanosis. Tender left hip, neurovascularly intact.  Pain at the  left hip with palpation. Compartements soft/NT 2+DP\PT Skin: no significant rashes, lesions, ulcers. Warm, dry, well-perfused. Neurologic: CN 2-12 grossly intact. Moving all extremities. Wakes to voice. Disoriented.   Psychiatric: Calm. Cooperative.   Image: CT PELVIS WO CONTRAST  Result Date: 10/11/2022 CLINICAL DATA:  Fall EXAM: CT PELVIS WITHOUT CONTRAST TECHNIQUE: Multidetector CT imaging of the pelvis was performed following the standard protocol without intravenous contrast. RADIATION DOSE REDUCTION: This exam was performed according to the departmental dose-optimization program which includes automated exposure control, adjustment of the mA and/or kV according to patient size and/or use of iterative reconstruction technique. COMPARISON:  Pelvic radiograph 10/11/2022 FINDINGS: Urinary Tract:  The bladder is unremarkable Bowel: Diverticular disease of the sigmoid colon without acute bowel wall thickening. Vascular/Lymphatic: No suspicious lymph nodes. Moderate atherosclerosis without aneurysm Reproductive:  Uterus unremarkable.  No adnexal mass Other:  Negative for pelvic effusion. Musculoskeletal: Pubic symphysis is intact. Old right inferior pubic ramus fracture. No dislocation. Mild to moderate arthritis of the bilateral hips. Acute nondisplaced fracture involving the left femoral neck, coronal series 7, image 75, axial series 3 image 88 through 97 and sagittal series 6 image 39-41. IMPRESSION: Acute nondisplaced left femoral neck fracture. Diverticular disease of sigmoid colon without acute inflammation Electronically Signed   By: Donavan Foil M.D.   On: 10/11/2022 21:49   DG Knee Complete 4 Views Left  Result Date: 10/11/2022 CLINICAL DATA:  Leg pain after fall; has advanced dementia EXAM: LEFT KNEE - COMPLETE 4+ VIEW; PELVIS - 1-2 VIEW; LEFT FEMUR 2 VIEWS COMPARISON:  Radiographs 10/10/2022 FINDINGS: Demineralization. Irregularity about the right inferior pubic ramus is unchanged from  10/10/2022 may represent a old fracture. Otherwise no definite fracture or dislocation in the hips or pelvis. No acute fracture in the femur. No acute fracture or dislocation of the left knee. No knee joint effusion. Tricompartmental degenerative arthritis. Patellar enthesophytes. IMPRESSION: No definite acute fracture in the pelvis, hips, left femur or left knee. Electronically Signed   By: Placido Sou M.D.   On: 10/11/2022 19:03   DG FEMUR MIN 2 VIEWS LEFT  Result Date: 10/11/2022 CLINICAL DATA:  Leg pain after fall; has advanced dementia EXAM: LEFT KNEE - COMPLETE 4+ VIEW; PELVIS - 1-2 VIEW; LEFT FEMUR 2 VIEWS COMPARISON:  Radiographs 10/10/2022 FINDINGS: Demineralization. Irregularity about the right inferior pubic ramus is unchanged from 10/10/2022 may represent a old fracture. Otherwise no definite fracture or dislocation in the hips or pelvis. No acute fracture in the femur. No acute fracture or dislocation of the left knee. No knee joint effusion. Tricompartmental degenerative arthritis. Patellar enthesophytes. IMPRESSION: No definite acute fracture in the pelvis, hips, left femur or left knee. Electronically Signed   By: Placido Sou M.D.   On: 10/11/2022  19:03   DG Pelvis 1-2 Views  Result Date: 10/11/2022 CLINICAL DATA:  Leg pain after fall; has advanced dementia EXAM: LEFT KNEE - COMPLETE 4+ VIEW; PELVIS - 1-2 VIEW; LEFT FEMUR 2 VIEWS COMPARISON:  Radiographs 10/10/2022 FINDINGS: Demineralization. Irregularity about the right inferior pubic ramus is unchanged from 10/10/2022 may represent a old fracture. Otherwise no definite fracture or dislocation in the hips or pelvis. No acute fracture in the femur. No acute fracture or dislocation of the left knee. No knee joint effusion. Tricompartmental degenerative arthritis. Patellar enthesophytes. IMPRESSION: No definite acute fracture in the pelvis, hips, left femur or left knee. Electronically Signed   By: Placido Sou M.D.   On: 10/11/2022  19:03   DG Chest Portable 1 View  Result Date: 10/10/2022 CLINICAL DATA:  Fall EXAM: PORTABLE CHEST 1 VIEW COMPARISON:  05/17/2022 and prior studies FINDINGS: The cardiomediastinal silhouette is unchanged. A hiatal hernia is again noted. There is no evidence of focal airspace disease, pulmonary edema, suspicious pulmonary nodule/mass, pleural effusion, or pneumothorax. Minimal LEFT basilar atelectasis/scarring noted. No acute bony abnormalities are identified. Remote rib and RIGHT clavicle fractures again identified. IMPRESSION: 1. No acute disease. 2. Hiatal hernia. Electronically Signed   By: Margarette Canada M.D.   On: 10/10/2022 10:19   CT Head Wo Contrast  Result Date: 10/10/2022 CLINICAL DATA:  Provided history: Neck trauma. Head trauma, minor. Unwitnessed fall. EXAM: CT HEAD WITHOUT CONTRAST CT CERVICAL SPINE WITHOUT CONTRAST TECHNIQUE: Multidetector CT imaging of the head and cervical spine was performed following the standard protocol without intravenous contrast. Multiplanar CT image reconstructions of the cervical spine were also generated. RADIATION DOSE REDUCTION: This exam was performed according to the departmental dose-optimization program which includes automated exposure control, adjustment of the mA and/or kV according to patient size and/or use of iterative reconstruction technique. COMPARISON:  Brain MRI 05/17/2022. Head CT 05/17/2022. CT cervical spine 11/05/2021. FINDINGS: CT HEAD FINDINGS Brain: Moderate cerebral atrophy. Mild patchy and ill-defined hypoattenuation within the cerebral white matter, nonspecific but compatible with chronic small ischemic disease. There is no acute intracranial hemorrhage. No demarcated cortical infarct. No extra-axial fluid collection. No evidence of an intracranial mass. No midline shift. Vascular: No hyperdense vessel.  Atherosclerotic calcifications. Skull: No fracture or aggressive osseous lesion. Sinuses/Orbits: No mass or acute finding within the  imaged orbits. No significant paranasal sinus disease at the imaged levels. CT CERVICAL SPINE FINDINGS Alignment: No significant spondylolisthesis. Skull base and vertebrae: Cervical vertebral body height is maintained. No evidence of acute fracture to the cervical spine. Subtle chronic T1 anterior wedge vertebral compression deformity. Mild T3 superior endplate compression deformity. These compression deformities are unchanged as compared to the prior cervical spine CT of 11/05/2021. Soft tissues and spinal canal: No prevertebral soft tissue swelling or visible canal hematoma. Multinodular thyroid gland, previously assessed by ultrasound in 2012. No follow-up imaging is recommended. Disc levels: Mild for age cervical spondylosis. No appreciable high-grade spinal canal stenosis. No high-grade bony neural foraminal narrowing. Osseous fusion across the disc space at T3-T4. Upper chest: No consolidation within the imaged lung apices. No visible pneumothorax. IMPRESSION: CT head: 1. No evidence of acute intracranial abnormality. 2. Mild chronic small vessel changes within the cerebral white matter. 3. Moderate generalized cerebral atrophy. CT cervical spine: 1. No evidence of acute fracture to the cervical spine. 2. Mild chronic T1 and T3 vertebral compression deformities, unchanged from the prior cervical spine CT of 11/05/2021. 3. Mild for age cervical spondylosis. Electronically Signed   By:  Kellie Simmering D.O.   On: 10/10/2022 10:18   CT Cervical Spine Wo Contrast  Result Date: 10/10/2022 CLINICAL DATA:  Provided history: Neck trauma. Head trauma, minor. Unwitnessed fall. EXAM: CT HEAD WITHOUT CONTRAST CT CERVICAL SPINE WITHOUT CONTRAST TECHNIQUE: Multidetector CT imaging of the head and cervical spine was performed following the standard protocol without intravenous contrast. Multiplanar CT image reconstructions of the cervical spine were also generated. RADIATION DOSE REDUCTION: This exam was performed according  to the departmental dose-optimization program which includes automated exposure control, adjustment of the mA and/or kV according to patient size and/or use of iterative reconstruction technique. COMPARISON:  Brain MRI 05/17/2022. Head CT 05/17/2022. CT cervical spine 11/05/2021. FINDINGS: CT HEAD FINDINGS Brain: Moderate cerebral atrophy. Mild patchy and ill-defined hypoattenuation within the cerebral white matter, nonspecific but compatible with chronic small ischemic disease. There is no acute intracranial hemorrhage. No demarcated cortical infarct. No extra-axial fluid collection. No evidence of an intracranial mass. No midline shift. Vascular: No hyperdense vessel.  Atherosclerotic calcifications. Skull: No fracture or aggressive osseous lesion. Sinuses/Orbits: No mass or acute finding within the imaged orbits. No significant paranasal sinus disease at the imaged levels. CT CERVICAL SPINE FINDINGS Alignment: No significant spondylolisthesis. Skull base and vertebrae: Cervical vertebral body height is maintained. No evidence of acute fracture to the cervical spine. Subtle chronic T1 anterior wedge vertebral compression deformity. Mild T3 superior endplate compression deformity. These compression deformities are unchanged as compared to the prior cervical spine CT of 11/05/2021. Soft tissues and spinal canal: No prevertebral soft tissue swelling or visible canal hematoma. Multinodular thyroid gland, previously assessed by ultrasound in 2012. No follow-up imaging is recommended. Disc levels: Mild for age cervical spondylosis. No appreciable high-grade spinal canal stenosis. No high-grade bony neural foraminal narrowing. Osseous fusion across the disc space at T3-T4. Upper chest: No consolidation within the imaged lung apices. No visible pneumothorax. IMPRESSION: CT head: 1. No evidence of acute intracranial abnormality. 2. Mild chronic small vessel changes within the cerebral white matter. 3. Moderate generalized  cerebral atrophy. CT cervical spine: 1. No evidence of acute fracture to the cervical spine. 2. Mild chronic T1 and T3 vertebral compression deformities, unchanged from the prior cervical spine CT of 11/05/2021. 3. Mild for age cervical spondylosis. Electronically Signed   By: Kellie Simmering D.O.   On: 10/10/2022 10:18   DG Pelvis 1-2 Views  Result Date: 10/10/2022 CLINICAL DATA:  Fall today.  Pelvic pain. EXAM: PELVIS - 1-2 VIEW COMPARISON:  None Available. FINDINGS: Nondisplaced fracture involving the right inferior pubic ramus is seen which is of indeterminate age radiographically. No other acute fractures identified. No evidence of pelvic diastasis. Moderate right and mild left hip osteoarthritis noted. IMPRESSION: Nondisplaced fracture of the right inferior pubic ramus, of indeterminate age radiographically. Bilateral hip osteoarthritis. Electronically Signed   By: Marlaine Hind M.D.   On: 10/10/2022 10:14    Assessment/Plan    1. Left hip fracture  - Non-displaced left femoral neck fracture seen on CT  - Per medical team she presents an estimated 0.7% risk of perioperative MI or cardiac arrest  - Keep NPO, continue pain-control and supportive care  Dr Lyla Glassing to take to OR on 11/8 for definitive fracture management   2. Dementia  - Delirium precautions per medical team

## 2022-10-13 ENCOUNTER — Inpatient Hospital Stay (HOSPITAL_COMMUNITY): Payer: Medicare PPO | Admitting: Certified Registered Nurse Anesthetist

## 2022-10-13 ENCOUNTER — Other Ambulatory Visit: Payer: Self-pay

## 2022-10-13 ENCOUNTER — Inpatient Hospital Stay (HOSPITAL_COMMUNITY): Payer: Medicare PPO

## 2022-10-13 ENCOUNTER — Encounter (HOSPITAL_COMMUNITY)
Admission: EM | Disposition: A | Discharge status: Skilled Nursing Facility | Payer: Self-pay | Source: Home / Self Care | Attending: Internal Medicine

## 2022-10-13 DIAGNOSIS — S72002A Fracture of unspecified part of neck of left femur, initial encounter for closed fracture: Secondary | ICD-10-CM | POA: Diagnosis not present

## 2022-10-13 DIAGNOSIS — K449 Diaphragmatic hernia without obstruction or gangrene: Secondary | ICD-10-CM

## 2022-10-13 DIAGNOSIS — I2721 Secondary pulmonary arterial hypertension: Secondary | ICD-10-CM

## 2022-10-13 DIAGNOSIS — F0390 Unspecified dementia without behavioral disturbance: Secondary | ICD-10-CM

## 2022-10-13 HISTORY — PX: TOTAL HIP ARTHROPLASTY: SHX124

## 2022-10-13 LAB — CBC
HCT: 40.6 % (ref 36.0–46.0)
Hemoglobin: 13.1 g/dL (ref 12.0–15.0)
MCH: 28.7 pg (ref 26.0–34.0)
MCHC: 32.3 g/dL (ref 30.0–36.0)
MCV: 88.8 fL (ref 80.0–100.0)
Platelets: 202 10*3/uL (ref 150–400)
RBC: 4.57 MIL/uL (ref 3.87–5.11)
RDW: 13.3 % (ref 11.5–15.5)
WBC: 11.1 10*3/uL — ABNORMAL HIGH (ref 4.0–10.5)
nRBC: 0 % (ref 0.0–0.2)

## 2022-10-13 LAB — BASIC METABOLIC PANEL
Anion gap: 8 (ref 5–15)
BUN: 17 mg/dL (ref 8–23)
CO2: 23 mmol/L (ref 22–32)
Calcium: 8.2 mg/dL — ABNORMAL LOW (ref 8.9–10.3)
Chloride: 107 mmol/L (ref 98–111)
Creatinine, Ser: 0.82 mg/dL (ref 0.44–1.00)
GFR, Estimated: >60 mL/min (ref >60)
Glucose, Bld: 109 mg/dL — ABNORMAL HIGH (ref 70–99)
Potassium: 4.2 mmol/L (ref 3.5–5.1)
Sodium: 138 mmol/L (ref 135–145)

## 2022-10-13 SURGERY — ARTHROPLASTY, HIP, TOTAL, ANTERIOR APPROACH
Anesthesia: General | Site: Hip | Laterality: Left

## 2022-10-13 MED ORDER — SODIUM CHLORIDE (PF) 0.9 % IJ SOLN
INTRAMUSCULAR | Status: DC | PRN
  Administered 2022-10-13: 30 mL via INTRAVENOUS

## 2022-10-13 MED ORDER — KETOROLAC TROMETHAMINE 30 MG/ML IJ SOLN
INTRAMUSCULAR | Status: AC
  Filled 2022-10-13: qty 1

## 2022-10-13 MED ORDER — KETOROLAC TROMETHAMINE 30 MG/ML IJ SOLN
INTRAMUSCULAR | Status: DC | PRN
  Administered 2022-10-13: 30 mg via INTRAMUSCULAR

## 2022-10-13 MED ORDER — PHENOL 1.4 % MT LIQD: 1.0000 | OROMUCOSAL | Status: DC | PRN

## 2022-10-13 MED ORDER — ACETAMINOPHEN 325 MG PO TABS: 325.0000 mg | ORAL_TABLET | Freq: Four times a day (QID) | ORAL | Status: DC | PRN

## 2022-10-13 MED ORDER — CEFAZOLIN SODIUM-DEXTROSE 2-4 GM/100ML-% IV SOLN
2.0000 g | INTRAVENOUS | Status: AC
  Administered 2022-10-13: 2 g via INTRAVENOUS
  Filled 2022-10-13: qty 100

## 2022-10-13 MED ORDER — ROCURONIUM BROMIDE 10 MG/ML (PF) SYRINGE
PREFILLED_SYRINGE | INTRAVENOUS | Status: AC
  Filled 2022-10-13: qty 10

## 2022-10-13 MED ORDER — FENTANYL CITRATE (PF) 100 MCG/2ML IJ SOLN
INTRAMUSCULAR | Status: AC
  Filled 2022-10-13: qty 2

## 2022-10-13 MED ORDER — ONDANSETRON HCL 4 MG PO TABS: 4.0000 mg | ORAL_TABLET | Freq: Four times a day (QID) | ORAL | Status: DC | PRN

## 2022-10-13 MED ORDER — SENNA 8.6 MG PO TABS
1.0000 | ORAL_TABLET | Freq: Two times a day (BID) | ORAL | Status: DC
  Administered 2022-10-13 – 2022-10-15 (×4): 8.6 mg via ORAL
  Filled 2022-10-13 (×4): qty 1

## 2022-10-13 MED ORDER — POVIDONE-IODINE 10 % EX SWAB
2.0000 | Freq: Once | CUTANEOUS | Status: AC
  Administered 2022-10-13: 2 via TOPICAL

## 2022-10-13 MED ORDER — CHLORHEXIDINE GLUCONATE 4 % EX LIQD
60.0000 mL | Freq: Once | CUTANEOUS | Status: AC
  Administered 2022-10-13: 4 via TOPICAL

## 2022-10-13 MED ORDER — TRANEXAMIC ACID-NACL 1000-0.7 MG/100ML-% IV SOLN
1000.0000 mg | INTRAVENOUS | Status: AC
  Administered 2022-10-13: 1000 mg via INTRAVENOUS

## 2022-10-13 MED ORDER — ONDANSETRON HCL 4 MG/2ML IJ SOLN
INTRAMUSCULAR | Status: AC
  Filled 2022-10-13: qty 2

## 2022-10-13 MED ORDER — LACTATED RINGERS IV SOLN: INTRAVENOUS | Status: DC

## 2022-10-13 MED ORDER — ONDANSETRON HCL 4 MG/2ML IJ SOLN: 4.0000 mg | Freq: Four times a day (QID) | INTRAMUSCULAR | Status: DC | PRN

## 2022-10-13 MED ORDER — ACETAMINOPHEN 500 MG PO TABS
1000.0000 mg | ORAL_TABLET | Freq: Once | ORAL | Status: DC
  Filled 2022-10-13: qty 2

## 2022-10-13 MED ORDER — CEFAZOLIN SODIUM-DEXTROSE 2-4 GM/100ML-% IV SOLN
2.0000 g | Freq: Four times a day (QID) | INTRAVENOUS | Status: AC
  Administered 2022-10-13 – 2022-10-14 (×2): 2 g via INTRAVENOUS
  Filled 2022-10-13 (×2): qty 100

## 2022-10-13 MED ORDER — POLYETHYLENE GLYCOL 3350 17 G PO PACK: 17.0000 g | PACK | Freq: Every day | ORAL | Status: DC | PRN

## 2022-10-13 MED ORDER — PROPOFOL 1000 MG/100ML IV EMUL
INTRAVENOUS | Status: AC
  Filled 2022-10-13: qty 100

## 2022-10-13 MED ORDER — DOCUSATE SODIUM 100 MG PO CAPS
100.0000 mg | ORAL_CAPSULE | Freq: Two times a day (BID) | ORAL | Status: DC
  Administered 2022-10-13 – 2022-10-15 (×4): 100 mg via ORAL
  Filled 2022-10-13 (×4): qty 1

## 2022-10-13 MED ORDER — SODIUM CHLORIDE 0.9 % IV SOLN: INTRAVENOUS | Status: DC

## 2022-10-13 MED ORDER — FENTANYL CITRATE (PF) 100 MCG/2ML IJ SOLN
INTRAMUSCULAR | Status: DC | PRN
  Administered 2022-10-13 (×2): 25 ug via INTRAVENOUS

## 2022-10-13 MED ORDER — SODIUM CHLORIDE (PF) 0.9 % IJ SOLN
INTRAMUSCULAR | Status: AC
  Filled 2022-10-13: qty 50

## 2022-10-13 MED ORDER — PROPOFOL 10 MG/ML IV BOLUS
INTRAVENOUS | Status: DC | PRN
  Administered 2022-10-13: 70 mg via INTRAVENOUS

## 2022-10-13 MED ORDER — METHOCARBAMOL 500 MG IVPB - SIMPLE MED: 500.0000 mg | Freq: Four times a day (QID) | INTRAVENOUS | Status: DC | PRN

## 2022-10-13 MED ORDER — ISOPROPYL ALCOHOL 70 % SOLN
Status: DC | PRN
  Administered 2022-10-13: 1 via TOPICAL

## 2022-10-13 MED ORDER — BISACODYL 10 MG RE SUPP: 10.0000 mg | Freq: Every day | RECTAL | Status: DC | PRN

## 2022-10-13 MED ORDER — AMISULPRIDE (ANTIEMETIC) 5 MG/2ML IV SOLN: 5.0000 mg | Freq: Once | INTRAVENOUS | Status: DC | PRN

## 2022-10-13 MED ORDER — KETOROLAC TROMETHAMINE 15 MG/ML IJ SOLN
7.5000 mg | Freq: Four times a day (QID) | INTRAMUSCULAR | Status: AC
  Administered 2022-10-14 (×4): 7.5 mg via INTRAVENOUS
  Filled 2022-10-13 (×4): qty 1

## 2022-10-13 MED ORDER — HYDROCODONE-ACETAMINOPHEN 7.5-325 MG PO TABS: 1.0000 | ORAL_TABLET | ORAL | Status: DC | PRN

## 2022-10-13 MED ORDER — BUPIVACAINE-EPINEPHRINE (PF) 0.25% -1:200000 IJ SOLN
INTRAMUSCULAR | Status: AC
  Filled 2022-10-13: qty 30

## 2022-10-13 MED ORDER — EPHEDRINE SULFATE-NACL 50-0.9 MG/10ML-% IV SOSY
PREFILLED_SYRINGE | INTRAVENOUS | Status: DC | PRN
  Administered 2022-10-13: 5 mg via INTRAVENOUS

## 2022-10-13 MED ORDER — HYDROMORPHONE HCL 1 MG/ML IJ SOLN: 0.2500 mg | INTRAMUSCULAR | Status: DC | PRN

## 2022-10-13 MED ORDER — SODIUM CHLORIDE 0.9 % IR SOLN
Status: DC | PRN
  Administered 2022-10-13 (×2): 1000 mL

## 2022-10-13 MED ORDER — FLEET ENEMA 7-19 GM/118ML RE ENEM: 1.0000 | ENEMA | Freq: Once | RECTAL | Status: DC | PRN

## 2022-10-13 MED ORDER — SUGAMMADEX SODIUM 200 MG/2ML IV SOLN
INTRAVENOUS | Status: DC | PRN
  Administered 2022-10-13: 200 mg via INTRAVENOUS

## 2022-10-13 MED ORDER — EPHEDRINE 5 MG/ML INJ
INTRAVENOUS | Status: AC
  Filled 2022-10-13: qty 5

## 2022-10-13 MED ORDER — MENTHOL 3 MG MT LOZG: 1.0000 | LOZENGE | OROMUCOSAL | Status: DC | PRN

## 2022-10-13 MED ORDER — ROCURONIUM BROMIDE 10 MG/ML (PF) SYRINGE
PREFILLED_SYRINGE | INTRAVENOUS | Status: DC | PRN
  Administered 2022-10-13: 50 mg via INTRAVENOUS
  Administered 2022-10-13: 10 mg via INTRAVENOUS

## 2022-10-13 MED ORDER — BUPIVACAINE-EPINEPHRINE 0.25% -1:200000 IJ SOLN
INTRAMUSCULAR | Status: DC | PRN
  Administered 2022-10-13: 30 mL

## 2022-10-13 MED ORDER — ONDANSETRON HCL 4 MG/2ML IJ SOLN
INTRAMUSCULAR | Status: DC | PRN
  Administered 2022-10-13: 4 mg via INTRAVENOUS

## 2022-10-13 MED ORDER — HYDROCODONE-ACETAMINOPHEN 5-325 MG PO TABS: 1.0000 | ORAL_TABLET | ORAL | Status: DC | PRN

## 2022-10-13 MED ORDER — DEXAMETHASONE SODIUM PHOSPHATE 10 MG/ML IJ SOLN
INTRAMUSCULAR | Status: DC | PRN
  Administered 2022-10-13: 8 mg via INTRAVENOUS

## 2022-10-13 MED ORDER — METOCLOPRAMIDE HCL 5 MG PO TABS: 5.0000 mg | ORAL_TABLET | Freq: Three times a day (TID) | ORAL | Status: DC | PRN

## 2022-10-13 MED ORDER — MORPHINE SULFATE (PF) 2 MG/ML IV SOLN: 0.5000 mg | INTRAVENOUS | Status: DC | PRN

## 2022-10-13 MED ORDER — METOCLOPRAMIDE HCL 5 MG/ML IJ SOLN: 5.0000 mg | Freq: Three times a day (TID) | INTRAMUSCULAR | Status: DC | PRN

## 2022-10-13 MED ORDER — LIDOCAINE HCL (PF) 2 % IJ SOLN
INTRAMUSCULAR | Status: AC
  Filled 2022-10-13: qty 5

## 2022-10-13 MED ORDER — ASPIRIN 325 MG PO TBEC
325.0000 mg | DELAYED_RELEASE_TABLET | Freq: Every day | ORAL | Status: DC
  Administered 2022-10-14 – 2022-10-15 (×2): 325 mg via ORAL
  Filled 2022-10-13 (×2): qty 1

## 2022-10-13 MED ORDER — LIDOCAINE 2% (20 MG/ML) 5 ML SYRINGE
INTRAMUSCULAR | Status: DC | PRN
  Administered 2022-10-13: 60 mg via INTRAVENOUS

## 2022-10-13 MED ORDER — DEXAMETHASONE SODIUM PHOSPHATE 10 MG/ML IJ SOLN
INTRAMUSCULAR | Status: AC
  Filled 2022-10-13: qty 1

## 2022-10-13 MED ORDER — PROPOFOL 10 MG/ML IV BOLUS
INTRAVENOUS | Status: AC
  Filled 2022-10-13: qty 20

## 2022-10-13 MED ORDER — METHOCARBAMOL 500 MG PO TABS: 500.0000 mg | ORAL_TABLET | Freq: Four times a day (QID) | ORAL | Status: DC | PRN

## 2022-10-13 MED ORDER — WATER FOR IRRIGATION, STERILE IR SOLN
Status: DC | PRN
  Administered 2022-10-13: 2000 mL

## 2022-10-13 SURGICAL SUPPLY — 51 items
ADH SKN CLS APL DERMABOND .7 (GAUZE/BANDAGES/DRESSINGS) ×2
APL PRP STRL LF DISP 70% ISPRP (MISCELLANEOUS) ×1
BAG COUNTER SPONGE SURGICOUNT (BAG) IMPLANT
BAG SPNG CNTER NS LX DISP (BAG) ×1
CHLORAPREP W/TINT 26 (MISCELLANEOUS) ×2 IMPLANT
COVER PERINEAL POST (MISCELLANEOUS) ×2 IMPLANT
COVER SURGICAL LIGHT HANDLE (MISCELLANEOUS) ×2 IMPLANT
CUP ACETAB RINGLOC 28X43 (Cup) IMPLANT
DERMABOND ADVANCED .7 DNX12 (GAUZE/BANDAGES/DRESSINGS) ×4 IMPLANT
DRAPE IMP U-DRAPE 54X76 (DRAPES) ×2 IMPLANT
DRAPE SHEET LG 3/4 BI-LAMINATE (DRAPES) ×6 IMPLANT
DRAPE STERI IOBAN 125X83 (DRAPES) ×2 IMPLANT
DRAPE U-SHAPE 47X51 STRL (DRAPES) ×4 IMPLANT
DRESSING AQUACEL AG SP 3.5X10 (GAUZE/BANDAGES/DRESSINGS) IMPLANT
DRSG AQUACEL AG ADV 3.5X10 (GAUZE/BANDAGES/DRESSINGS) ×2 IMPLANT
DRSG AQUACEL AG SP 3.5X10 (GAUZE/BANDAGES/DRESSINGS) ×1
ELECT REM PT RETURN 15FT ADLT (MISCELLANEOUS) ×2 IMPLANT
GAUZE SPONGE 4X4 12PLY STRL (GAUZE/BANDAGES/DRESSINGS) ×2 IMPLANT
GLOVE BIO SURGEON STRL SZ8.5 (GLOVE) ×4 IMPLANT
GLOVE BIOGEL M 7.0 STRL (GLOVE) ×2 IMPLANT
GLOVE BIOGEL PI IND STRL 7.5 (GLOVE) ×2 IMPLANT
GLOVE BIOGEL PI IND STRL 8.5 (GLOVE) ×2 IMPLANT
GOWN SPEC L3 XXLG W/TWL (GOWN DISPOSABLE) ×2 IMPLANT
GOWN STRL REUS W/ TWL XL LVL3 (GOWN DISPOSABLE) ×2 IMPLANT
GOWN STRL REUS W/TWL XL LVL3 (GOWN DISPOSABLE) ×1
HANDPIECE INTERPULSE COAX TIP (DISPOSABLE) ×1
HEAD MODULAR STD 28MM (Orthopedic Implant) IMPLANT
HOLDER FOLEY CATH W/STRAP (MISCELLANEOUS) ×2 IMPLANT
HOOD PEEL AWAY T7 (MISCELLANEOUS) ×6 IMPLANT
KIT TURNOVER KIT A (KITS) IMPLANT
MANIFOLD NEPTUNE II (INSTRUMENTS) ×2 IMPLANT
MARKER SKIN DUAL TIP RULER LAB (MISCELLANEOUS) ×2 IMPLANT
NDL SAFETY ECLIP 18X1.5 (MISCELLANEOUS) ×2 IMPLANT
NDL SPNL 18GX3.5 QUINCKE PK (NEEDLE) ×2 IMPLANT
NEEDLE SPNL 18GX3.5 QUINCKE PK (NEEDLE) ×1 IMPLANT
PACK ANTERIOR HIP CUSTOM (KITS) ×2 IMPLANT
SAW OSC TIP CART 19.5X105X1.3 (SAW) ×2 IMPLANT
SEALER BIPOLAR AQUA 6.0 (INSTRUMENTS) ×2 IMPLANT
SET HNDPC FAN SPRY TIP SCT (DISPOSABLE) ×2 IMPLANT
SOLUTION PRONTOSAN WOUND 350ML (IRRIGATION / IRRIGATOR) ×2 IMPLANT
SPIKE FLUID TRANSFER (MISCELLANEOUS) ×2 IMPLANT
STAPLER VISISTAT 35W (STAPLE) IMPLANT
STEM FEM CMTLS STD 15X150 123D (Stem) IMPLANT
SUT MNCRL AB 3-0 PS2 18 (SUTURE) ×2 IMPLANT
SUT MON AB 2-0 CT1 36 (SUTURE) ×2 IMPLANT
SUT STRATAFIX PDO 1 14 VIOLET (SUTURE) ×1
SUT STRATFX PDO 1 14 VIOLET (SUTURE) ×1
SUTURE STRATFX PDO 1 14 VIOLET (SUTURE) ×2 IMPLANT
SYR 3ML LL SCALE MARK (SYRINGE) ×2 IMPLANT
TUBE SUCTION HIGH CAP CLEAR NV (SUCTIONS) ×2 IMPLANT
WATER STERILE IRR 1000ML POUR (IV SOLUTION) ×2 IMPLANT

## 2022-10-13 NOTE — Op Note (Signed)
OPERATIVE REPORT  SURGEON: Rod Can, MD   ASSISTANT: Larene Pickett, PA-C  PREOPERATIVE DIAGNOSIS: Displaced Left femoral neck fracture.   POSTOPERATIVE DIAGNOSIS: Displaced Left femoral neck fracture.   PROCEDURE: Left hip hemiarthroplasty, anterior approach.   IMPLANTS: Biomet Taperloc Complete Reduced Distal stem, size 15 x 164m, standard offset, with a 28+0 mm metal head ball and a 43 mm bipolar construct.  ANESTHESIA:  General  ANTIBIOTICS: 2g ancef.  ESTIMATED BLOOD LOSS:-200 mL    DRAINS: None.  COMPLICATIONS: None   CONDITION: PACU - hemodynamically stable.   BRIEF CLINICAL NOTE: Autumn Wallace a 88y.o. female with a displaced Left femoral neck fracture. The patient was admitted to the hospitalist service and underwent perioperative risk stratification and medical optimization. The risks, benefits, and alternatives to hemiarthroplasty were explained, and the patient elected to proceed.  PROCEDURE IN DETAIL: The patient was taken to the operating room and general anesthesia was induced on the hospital bed.  The patient was then positioned on the Hana table.  All bony prominences were well padded.  The hip was prepped and draped in the normal sterile surgical fashion.  A time-out was called verifying side and site of surgery. Antibiotics were given within 60 minutes of beginning the procedure.   Bikini incision was made, and the direct anterior approach to the hip was performed through the Hueter interval.  Superficial dissection was carried out lateral to the ASIS. Lateral femoral circumflex vessels were treated with the Auqumantys. The anterior capsule was exposed and an inverted T capsulotomy was made. Fracture hematoma was encountered and evacuated. The patient was found to have a comminuted Left subcapital femoral neck fracture.  Inferior pubofemoral ligament was released subperiosteally to the lesser trochanter. I freshened the femoral neck cut with a saw.  I  removed the femoral neck fragment.  A corkscrew was placed into the head and the head was removed.  This was passed to the back table and was measured.   Acetabular exposure was achieved.  I examined the articular cartilage which was intact.  The labrum was intact. A 43 mm trial head was placed and found to have excellent fit.   I then gained femoral exposure taking care to protect the abductors and greater trochanter.  The superior capsule was incised longitudinally, staying lateral to the posterior border of the femoral neck. External rotation, extension, and adduction were applied.  A cookie cutter was used to enter the femoral canal, and then the femoral canal finder was used to confirm location.  I then sequentially broached up to a size 15.  Calcar planer was used on the femoral neck remnant.  I placed a standard offset neck and a trial bipolar construct. The hip was reduced.  Leg lengths were checked fluoroscopically.  The hip was dislocated and trial components were removed.  I placed the real stem followed by the real bipolar construct.  A single reduction maneuver was performed and the hip was reduced.  Fluoroscopy was used to confirm component position and leg lengths.  At 90 degrees of external rotation and extension, the hip was stable to an anterior directed force.   The wound was copiously irrigated with Irrisept solution and normal saline using pulse lavage.  Marcaine solution was injected into the periarticular soft tissue.  The wound was closed in layers using #1 Stratafix for the fascia, 2-0 Vicryl for the subcutaneous fat, 2-0 Monocryl for the deep dermal layer, and staples + Dermabond for the skin.  Once  the glue was fully dried, an Aquacell Ag dressing was applied.  The patient was then awakened from anesthesia and transported to the recovery room in stable condition.  Sponge, needle, and instrument counts were correct at the end of the case x2.  The patient tolerated the procedure well  and there were no known complications.  Please note that a surgical assistant was a medical necessity for this procedure to perform it in a safe and expeditious manner. Assistant was necessary to provide appropriate retraction of vital neurovascular structures, to prevent femoral fracture, and to allow for anatomic placement of the prosthesis.

## 2022-10-13 NOTE — Progress Notes (Signed)
PT Cancellation Note  Patient Details Name: Autumn Wallace MRN: [id number] DOB: October 23, 1932   Cancelled Treatment:    Reason Eval/Treat Not Completed: Medical issues which prohibited therapy, scheduled for surgery today. [place] Office [phone] Weekend pager-[phone]    Claretha Cooper 10/13/2022, 7:52 AM

## 2022-10-13 NOTE — Anesthesia Postprocedure Evaluation (Signed)
Anesthesia Post Note  Patient: Autumn Wallace  Procedure(s) Performed: TOTAL HIP ARTHROPLASTY ANTERIOR APPROACH (Left: Hip)     Patient location during evaluation: PACU Anesthesia Type: General Level of consciousness: sedated and patient cooperative Pain management: pain level controlled Vital Signs Assessment: post-procedure vital signs reviewed and stable Respiratory status: spontaneous breathing Cardiovascular status: stable Anesthetic complications: no   No notable events documented.  Last Vitals:  Vitals:   10/13/22 1811 10/13/22 2008  BP: (!) 156/49 129/67  Pulse: 64 66  Resp: 18 16  Temp: 36.8 C 36.7 C  SpO2: 100% 99%    Last Pain:  Vitals:   10/13/22 2008  TempSrc: Oral  PainSc:                  Nolon Nations

## 2022-10-13 NOTE — Progress Notes (Signed)
PROGRESS NOTE    Autumn Wallace  WNI:[id number] DOB: 1932-02-01 DOA: 10/11/2022 PCP: Crist Infante, MD    Brief Narrative:  88 year old with history of advanced dementia and paroxysmal SVT from memory care unit brought with mechanical fall, left hip pain.  She was found to have nondisplaced acute left femoral neck fracture.  Admitted for surgical intervention.   Assessment & Plan:   Closed traumatic left hip fracture: N.p.o., adequate pain medications, scheduled for ORIF today Dr. Rolena Infante.  PT OT and postop instructions after surgery.  High risk of postop delirium due to advanced dementia.  Fall precautions.  Dementia with delirium: High risk.  Fall precautions.  Delirium precautions.  Patient is on Depakote, Lexapro, Namenda.  She is in a memory care unit.  Nutrition Status: Nutrition Problem: Moderate Malnutrition Etiology: chronic illness (dementia) Signs/Symptoms: mild fat depletion, mild muscle depletion Interventions: MVI, Ensure Enlive (each supplement provides 350kcal and 20 grams of protein)     DVT prophylaxis: SCDs Start: 10/12/22 0226   Code Status: Full code Family Communication: None Disposition Plan: Status is: Inpatient Remains inpatient appropriate because: Inpatient procedure planned     Consultants:  Orthopedics  Procedures:  None  Antimicrobials:  Perioperative   Subjective: Patient examined at the bedside in the morning rounds.  She is going to surgery now.  Remains pleasantly confused and irritable.  No other overnight events.  Objective: Vitals:   10/12/22 1451 10/12/22 2035 10/13/22 0354 10/13/22 1255  BP: (!) 142/61 (!) 152/67 (!) 124/91 137/61  Pulse: 66 61 62 60  Resp: 16 18 (!) 22 17  Temp: 98.1 F (36.7 C) 99.1 F (37.3 C) 98.4 F (36.9 C)   TempSrc: Oral Oral Oral   SpO2: 99% 97% 98% 95%  Weight:      Height:        Intake/Output Summary (Last 24 hours) at 10/13/2022 1407 Last data filed at 10/13/2022 0900 Gross per 24  hour  Intake 845 ml  Output 750 ml  Net 95 ml   Filed Weights   10/11/22 1641 10/12/22 0136  Weight: 58.1 kg 58.9 kg    Examination:  General exam: Appears calm and comfortable  Slightly tremulous, anxious.  Pleasantly confused. Respiratory system: Clear to auscultation. Respiratory effort normal. Cardiovascular system: S1 & S2 heard, RRR.  Gastrointestinal system: Abdomen is nondistended, soft and nontender. No organomegaly or masses felt. Normal bowel sounds heard. Moves all extremities.  Uncomfortable on the left hip.    Data Reviewed: I have personally reviewed following labs and imaging studies  CBC: Recent Labs  Lab 10/11/22 1716 10/12/22 0642 10/13/22 0623  WBC 12.3* 8.7 11.1*  NEUTROABS 10.0*  --   --   HGB 12.8 12.7 13.1  HCT 40.2 38.6 40.6  MCV 90.3 88.5 88.8  PLT 233 214 381   Basic Metabolic Panel: Recent Labs  Lab 10/11/22 1716 10/12/22 0642 10/13/22 0623  NA 138 140 138  K 4.7 4.1 4.2  CL 103 108 107  CO2 '27 25 23  '$ GLUCOSE 123* 121* 109*  BUN 24* 17 17  CREATININE 1.00 0.85 0.82  CALCIUM 9.8 8.8* 8.2*   GFR: Estimated Creatinine Clearance: 41 mL/min (by C-G formula based on SCr of 0.82 mg/dL). Liver Function Tests: No results for input(s): "AST", "ALT", "ALKPHOS", "BILITOT", "PROT", "ALBUMIN" in the last 168 hours. No results for input(s): "LIPASE", "AMYLASE" in the last 168 hours. No results for input(s): "AMMONIA" in the last 168 hours. Coagulation Profile: No results for input(s): "INR", "  PROTIME" in the last 168 hours. Cardiac Enzymes: No results for input(s): "CKTOTAL", "CKMB", "CKMBINDEX", "TROPONINI" in the last 168 hours. BNP (last 3 results) No results for input(s): "PROBNP" in the last 8760 hours. HbA1C: No results for input(s): "HGBA1C" in the last 72 hours. CBG: Recent Labs  Lab 10/10/22 1048 10/12/22 1536  GLUCAP 88 111*   Lipid Profile: No results for input(s): "CHOL", "HDL", "LDLCALC", "TRIG", "CHOLHDL", "LDLDIRECT"  in the last 72 hours. Thyroid Function Tests: No results for input(s): "TSH", "T4TOTAL", "FREET4", "T3FREE", "THYROIDAB" in the last 72 hours. Anemia Panel: No results for input(s): "VITAMINB12", "FOLATE", "FERRITIN", "TIBC", "IRON", "RETICCTPCT" in the last 72 hours. Sepsis Labs: No results for input(s): "PROCALCITON", "LATICACIDVEN" in the last 168 hours.  No results found for this or any previous visit (from the past 240 hour(s)).       Radiology Studies: CT PELVIS WO CONTRAST  Result Date: 10/11/2022 CLINICAL DATA:  Fall EXAM: CT PELVIS WITHOUT CONTRAST TECHNIQUE: Multidetector CT imaging of the pelvis was performed following the standard protocol without intravenous contrast. RADIATION DOSE REDUCTION: This exam was performed according to the departmental dose-optimization program which includes automated exposure control, adjustment of the mA and/or kV according to patient size and/or use of iterative reconstruction technique. COMPARISON:  Pelvic radiograph 10/11/2022 FINDINGS: Urinary Tract:  The bladder is unremarkable Bowel: Diverticular disease of the sigmoid colon without acute bowel wall thickening. Vascular/Lymphatic: No suspicious lymph nodes. Moderate atherosclerosis without aneurysm Reproductive:  Uterus unremarkable.  No adnexal mass Other:  Negative for pelvic effusion. Musculoskeletal: Pubic symphysis is intact. Old right inferior pubic ramus fracture. No dislocation. Mild to moderate arthritis of the bilateral hips. Acute nondisplaced fracture involving the left femoral neck, coronal series 7, image 75, axial series 3 image 88 through 97 and sagittal series 6 image 39-41. IMPRESSION: Acute nondisplaced left femoral neck fracture. Diverticular disease of sigmoid colon without acute inflammation Electronically Signed   By: Donavan Foil M.D.   On: 10/11/2022 21:49   DG Knee Complete 4 Views Left  Result Date: 10/11/2022 CLINICAL DATA:  Leg pain after fall; has advanced dementia  EXAM: LEFT KNEE - COMPLETE 4+ VIEW; PELVIS - 1-2 VIEW; LEFT FEMUR 2 VIEWS COMPARISON:  Radiographs 10/10/2022 FINDINGS: Demineralization. Irregularity about the right inferior pubic ramus is unchanged from 10/10/2022 may represent a old fracture. Otherwise no definite fracture or dislocation in the hips or pelvis. No acute fracture in the femur. No acute fracture or dislocation of the left knee. No knee joint effusion. Tricompartmental degenerative arthritis. Patellar enthesophytes. IMPRESSION: No definite acute fracture in the pelvis, hips, left femur or left knee. Electronically Signed   By: Placido Sou M.D.   On: 10/11/2022 19:03   DG FEMUR MIN 2 VIEWS LEFT  Result Date: 10/11/2022 CLINICAL DATA:  Leg pain after fall; has advanced dementia EXAM: LEFT KNEE - COMPLETE 4+ VIEW; PELVIS - 1-2 VIEW; LEFT FEMUR 2 VIEWS COMPARISON:  Radiographs 10/10/2022 FINDINGS: Demineralization. Irregularity about the right inferior pubic ramus is unchanged from 10/10/2022 may represent a old fracture. Otherwise no definite fracture or dislocation in the hips or pelvis. No acute fracture in the femur. No acute fracture or dislocation of the left knee. No knee joint effusion. Tricompartmental degenerative arthritis. Patellar enthesophytes. IMPRESSION: No definite acute fracture in the pelvis, hips, left femur or left knee. Electronically Signed   By: Placido Sou M.D.   On: 10/11/2022 19:03   DG Pelvis 1-2 Views  Result Date: 10/11/2022 CLINICAL DATA:  Leg pain  after fall; has advanced dementia EXAM: LEFT KNEE - COMPLETE 4+ VIEW; PELVIS - 1-2 VIEW; LEFT FEMUR 2 VIEWS COMPARISON:  Radiographs 10/10/2022 FINDINGS: Demineralization. Irregularity about the right inferior pubic ramus is unchanged from 10/10/2022 may represent a old fracture. Otherwise no definite fracture or dislocation in the hips or pelvis. No acute fracture in the femur. No acute fracture or dislocation of the left knee. No knee joint effusion.  Tricompartmental degenerative arthritis. Patellar enthesophytes. IMPRESSION: No definite acute fracture in the pelvis, hips, left femur or left knee. Electronically Signed   By: Placido Sou M.D.   On: 10/11/2022 19:03        Scheduled Meds:  acetaminophen  1,000 mg Oral Once   [MAR Hold] cholecalciferol  2,000 Units Oral Daily   [MAR Hold] divalproex  125 mg Oral BID   [MAR Hold] docusate sodium  200 mg Oral Daily   [MAR Hold] escitalopram  10 mg Oral Daily   [MAR Hold] feeding supplement  237 mL Oral Daily   [MAR Hold] feeding supplement  296 mL Oral Once   [MAR Hold] galantamine  16 mg Oral Daily   [MAR Hold] memantine  10 mg Oral BID   [MAR Hold] multivitamin with minerals  1 tablet Oral Daily   [MAR Hold] polyethylene glycol  17 g Oral Daily   Continuous Infusions:   ceFAZolin (ANCEF) IV     lactated ringers 20 mL/hr at 10/13/22 1351   tranexamic acid       LOS: 1 day    Time spent: 35 minutes    Barb Merino, MD [facility] Pager [phone]

## 2022-10-13 NOTE — Transfer of Care (Signed)
Immediate Anesthesia Transfer of Care Note  Patient: Autumn Wallace  Procedure(s) Performed: TOTAL HIP ARTHROPLASTY ANTERIOR APPROACH (Left: Hip)  Patient Location: PACU  Anesthesia Type:General  Level of Consciousness: sedated  Airway & Oxygen Therapy: Patient Spontanous Breathing and Patient connected to face mask oxygen  Post-op Assessment: Report given to RN and Post -op Vital signs reviewed and stable  Post vital signs: Reviewed and stable  Last Vitals:  Vitals Value Taken Time  BP 135/75 10/13/22 1715  Temp    Pulse 59 10/13/22 1716  Resp    SpO2 100 % 10/13/22 1716  Vitals shown include unvalidated device data.  Last Pain:  Vitals:   10/13/22 1000  TempSrc:   PainSc: Asleep         Complications: No notable events documented.

## 2022-10-13 NOTE — Interval H&P Note (Signed)
History and Physical Interval Note:  10/13/2022 3:06 PM  Autumn Wallace  has presented today for surgery, with the diagnosis of displaced left hip.  The various methods of treatment have been discussed with the patient and family. After consideration of risks, benefits and other options for treatment, the patient has consented to  Procedure(s): TOTAL HIP ARTHROPLASTY ANTERIOR APPROACH (Left) as a surgical intervention.  The patient's history has been reviewed, patient examined, no change in status, stable for surgery.  I have reviewed the patient's chart and labs.  Questions were answered to the patient's satisfaction.    The risks, benefits, and alternatives were discussed with the patient. There are risks associated with the surgery including, but not limited to, problems with anesthesia (death), infection, instability (giving out of the joint), dislocation, differences in leg length/angulation/rotation, fracture of bones, loosening or failure of implants, hematoma (blood accumulation) which may require surgical drainage, blood clots, pulmonary embolism, nerve injury (foot drop and lateral thigh numbness), and blood vessel injury. The patient understands these risks and elects to proceed.    Hilton Cork [place] Behlke

## 2022-10-13 NOTE — Anesthesia Procedure Notes (Signed)
Procedure Name: Intubation Date/Time: 10/13/2022 3:43 PM  Performed by: Lind Covert, CRNAPre-anesthesia Checklist: Patient identified, Emergency Drugs available, Suction available, Patient being monitored and Timeout performed Patient Re-evaluated:Patient Re-evaluated prior to induction Oxygen Delivery Method: Circle system utilized Preoxygenation: Pre-oxygenation with 100% oxygen Induction Type: IV induction Ventilation: Mask ventilation without difficulty Laryngoscope Size: Mac, 3 and Glidescope Grade View: Grade I Tube type: Oral Tube size: 7.0 mm Number of attempts: 2 Airway Equipment and Method: Stylet Placement Confirmation: ETT inserted through vocal cords under direct vision, positive ETCO2 and breath sounds checked- equal and bilateral Secured at: 22 cm Tube secured with: Tape Dental Injury: Teeth and Oropharynx as per pre-operative assessment  Comments: Patient easy to mask ventilate. MAC 3 blade only able to see base of cords with dried secretions. Chose to convert to MAC 3 glidescope with grade 1 view. +BBS + ETCO2

## 2022-10-13 NOTE — Plan of Care (Signed)

## 2022-10-13 NOTE — Anesthesia Preprocedure Evaluation (Addendum)
Anesthesia Evaluation  Patient identified by MRN, date of birth, ID band Patient confused and Patient unresponsive    Reviewed: Allergy & Precautions, NPO status , Patient's Chart, lab work & pertinent test results  Airway Mallampati: III  TM Distance: >3 FB Neck ROM: Full    Dental  (+) Dental Advisory Given   Pulmonary neg pulmonary ROS   Pulmonary exam normal breath sounds clear to auscultation       Cardiovascular pulmonary hypertensionNormal cardiovascular exam Rhythm:Regular Rate:Normal  Echo 05/2022  1. Left ventricular ejection fraction, by estimation, is 70 to 75%. The left ventricle has hyperdynamic function. The left ventricle has no regional wall motion abnormalities. Left ventricular diastolic parameters were normal.   2. Right ventricular systolic function is normal. The right ventricular size is normal. There is mildly elevated pulmonary artery systolic pressure.   3. The mitral valve is normal in structure. Trivial mitral valve regurgitation.   4. The aortic valve is tricuspid. Aortic valve regurgitation is not visualized. Aortic valve sclerosis is present, with no evidence of aortic valve stenosis.   5. The inferior vena cava is normal in size with <50% respiratory variability, suggesting right atrial pressure of 8 mmHg.      Neuro/Psych  PSYCHIATRIC DISORDERS     Dementia    GI/Hepatic Neg liver ROS, hiatal hernia,,,  Endo/Other  negative endocrine ROS    Renal/GU negative Renal ROS     Musculoskeletal negative musculoskeletal ROS (+)    Abdominal   Peds  Hematology negative hematology ROS (+)   Anesthesia Other Findings   Reproductive/Obstetrics                              Anesthesia Physical Anesthesia Plan  ASA: 3  Anesthesia Plan: General   Post-op Pain Management: Tylenol PO (pre-op)*   Induction: Intravenous  PONV Risk Score and Plan: 3 and Ondansetron,  Dexamethasone, Treatment may vary due to age or medical condition, Propofol infusion and TIVA  Airway Management Planned: Oral ETT  Additional Equipment:   Intra-op Plan:   Post-operative Plan: Extubation in OR  Informed Consent: I have reviewed the patients History and Physical, chart, labs and discussed the procedure including the risks, benefits and alternatives for the proposed anesthesia with the patient or authorized representative who has indicated his/her understanding and acceptance.     Dental advisory given  Plan Discussed with: CRNA  Anesthesia Plan Comments:          Anesthesia Quick Evaluation

## 2022-10-14 ENCOUNTER — Encounter (HOSPITAL_COMMUNITY): Payer: Self-pay | Admitting: Orthopedic Surgery

## 2022-10-14 DIAGNOSIS — S72002A Fracture of unspecified part of neck of left femur, initial encounter for closed fracture: Secondary | ICD-10-CM | POA: Diagnosis not present

## 2022-10-14 LAB — BASIC METABOLIC PANEL
Anion gap: 8 (ref 5–15)
BUN: 24 mg/dL — ABNORMAL HIGH (ref 8–23)
CO2: 23 mmol/L (ref 22–32)
Calcium: 7.7 mg/dL — ABNORMAL LOW (ref 8.9–10.3)
Chloride: 109 mmol/L (ref 98–111)
Creatinine, Ser: 1.05 mg/dL — ABNORMAL HIGH (ref 0.44–1.00)
GFR, Estimated: 51 mL/min — ABNORMAL LOW (ref >60)
Glucose, Bld: 147 mg/dL — ABNORMAL HIGH (ref 70–99)
Potassium: 4.9 mmol/L (ref 3.5–5.1)
Sodium: 140 mmol/L (ref 135–145)

## 2022-10-14 LAB — CBC
HCT: 37.3 % (ref 36.0–46.0)
Hemoglobin: 11.7 g/dL — ABNORMAL LOW (ref 12.0–15.0)
MCH: 28.7 pg (ref 26.0–34.0)
MCHC: 31.4 g/dL (ref 30.0–36.0)
MCV: 91.6 fL (ref 80.0–100.0)
Platelets: 161 10*3/uL (ref 150–400)
RBC: 4.07 MIL/uL (ref 3.87–5.11)
RDW: 13.3 % (ref 11.5–15.5)
WBC: 13.5 10*3/uL — ABNORMAL HIGH (ref 4.0–10.5)
nRBC: 0 % (ref 0.0–0.2)

## 2022-10-14 MED ORDER — HYDROCODONE-ACETAMINOPHEN 5-325 MG PO TABS: 0.5000 | ORAL_TABLET | ORAL | 0 refills | Status: DC | PRN

## 2022-10-14 MED ORDER — HYDROCODONE-ACETAMINOPHEN 5-325 MG PO TABS: 0.5000 | ORAL_TABLET | ORAL | 0 refills | Status: AC | PRN

## 2022-10-14 MED ORDER — ASPIRIN 81 MG PO CHEW: 81.0000 mg | CHEWABLE_TABLET | Freq: Two times a day (BID) | ORAL | 0 refills | Status: DC

## 2022-10-14 MED ORDER — ASPIRIN 81 MG PO CHEW: 81.0000 mg | CHEWABLE_TABLET | Freq: Two times a day (BID) | ORAL | 0 refills | Status: AC

## 2022-10-14 NOTE — Evaluation (Signed)
Physical Therapy Evaluation Patient Details Name: Autumn Wallace MRN: [id number] DOB: 11/16/1932 Today's Date: 10/14/2022  History of Present Illness  Pt is an 88 yr old female admitted to the hospital with L hip pain after a fall. She was found to have a L displaced femoral neck fracture and is s/p a L hip hemiarthroplasty. PMH: dementia  Clinical Impression  Pt admitted with above diagnosis.  Pt currently with functional limitations due to the deficits listed below (see PT Problem List). Pt will benefit from skilled PT to increase their independence and safety with mobility to allow discharge to the venue listed below.   Pt's daughter present for session and assisted with guiding pt with mobility.  Pt's cognition limiting ability to participate however pt appears more accepting of assist from daughter.  Uncertain if memory care can provide required assist and daughter stated she would like pt to be as mobile as prior to fall so recommending SNF at this time.  Pt/family may benefit from palliative consult.        Recommendations for follow up therapy are one component of a multi-disciplinary discharge planning process, led by the attending physician.  Recommendations may be updated based on patient status, additional functional criteria and insurance authorization.  Follow Up Recommendations Skilled nursing-short term rehab (<3 hours/day) Can patient physically be transported by private vehicle: No    Assistance Recommended at Discharge Frequent or constant Supervision/Assistance  Patient can return home with the following  Two people to help with walking and/or transfers;Two people to help with bathing/dressing/bathroom    Equipment Recommendations None recommended by PT (next venue)  Recommendations for Other Services       Functional Status Assessment Patient has had a recent decline in their functional status and demonstrates the ability to make significant improvements in function  in a reasonable and predictable amount of time.     Precautions / Restrictions Precautions Precautions: Fall Restrictions Weight Bearing Restrictions: No LLE Weight Bearing: Weight bearing as tolerated      Mobility  Bed Mobility Overal bed mobility: Needs Assistance Bed Mobility: Supine to Sit     Supine to sit: +2 for safety/equipment, Total assist     General bed mobility comments: daughter and therapist assisted pt with getting to EOB, utilized bed pad    Transfers Overall transfer level: Needs assistance Equipment used: 2 person hand held assist, Rolling walker (2 wheels) Transfers: Sit to/from Stand, Bed to chair/wheelchair/BSC Sit to Stand: Mod assist, +2 physical assistance   Step pivot transfers: Mod assist, +2 physical assistance       General transfer comment: performed twice with HHA and then utilized RW for transfer to recliner; multimodal cues for technique    Ambulation/Gait                  Stairs            Wheelchair Mobility    Modified Rankin (Stroke Patients Only)       Balance Overall balance assessment: History of Falls         Standing balance support: Bilateral upper extremity supported, Reliant on assistive device for balance Standing balance-Leahy Scale: Zero                               Pertinent Vitals/Pain Pain Assessment Pain Assessment: PAINAD Breathing: occasional labored breathing, short period of hyperventilation Negative Vocalization: occasional moan/groan, low speech, negative/disapproving quality Facial Expression:  sad, frightened, frown Body Language: rigid, fists clenched, knees up, pushing/pulling away, strikes out Consolability: distracted or reassured by voice/touch PAINAD Score: 6 Pain Intervention(s): Repositioned, Monitored during session    Home Living Family/patient expects to be discharged to:: Assisted living                   Additional Comments: from memory  care unit    Prior Function Prior Level of Function : Needs assist             Mobility Comments: ambulatory without assistive device per daughter ADLs Comments: likely assist due to advanced dementia     Hand Dominance        Extremity/Trunk Assessment        Lower Extremity Assessment Lower Extremity Assessment: Generalized weakness;Difficult to assess due to impaired cognition       Communication   Communication: No difficulties  Cognition Arousal/Alertness: Awake/alert Behavior During Therapy: Restless Overall Cognitive Status: History of cognitive impairments - at baseline Area of Impairment: Orientation, Attention, Memory, Following commands, Safety/judgement, Awareness, Problem solving                 Orientation Level: Disoriented to, Person, Place, Time, Situation     Following Commands: Follows one step commands inconsistently Safety/Judgement: Decreased awareness of safety, Decreased awareness of deficits   Problem Solving: Decreased initiation, Difficulty sequencing, Requires tactile cues, Requires verbal cues General Comments: decreased command follow, occasional agitation, incoherent non-sensical speech; pt appears more receptive to daughter although still displays above deficits with her too        General Comments      Exercises     Assessment/Plan    PT Assessment Patient needs continued PT services  PT Problem List Decreased strength;Decreased mobility;Decreased activity tolerance;Decreased balance;Decreased knowledge of use of DME;Decreased cognition;Pain       PT Treatment Interventions Gait training;DME instruction;Therapeutic exercise;Balance training;Wheelchair mobility training;Functional mobility training;Therapeutic activities;Patient/family education;Cognitive remediation    PT Goals (Current goals can be found in the Care Plan section)  Acute Rehab PT Goals PT Goal Formulation: With patient/family Time For Goal  Achievement: 10/28/22 Potential to Achieve Goals: Fair    Frequency Min 2X/week     Co-evaluation               AM-PAC PT "6 Clicks" Mobility  Outcome Measure Help needed turning from your back to your side while in a flat bed without using bedrails?: A Lot Help needed moving from lying on your back to sitting on the side of a flat bed without using bedrails?: A Lot Help needed moving to and from a bed to a chair (including a wheelchair)?: Total Help needed standing up from a chair using your arms (e.g., wheelchair or bedside chair)?: Total Help needed to walk in hospital room?: Total Help needed climbing 3-5 steps with a railing? : Total 6 Click Score: 8    End of Session Equipment Utilized During Treatment: Gait belt Activity Tolerance: Patient limited by pain Patient left: in chair;with chair alarm set;with call bell/phone within reach;with family/visitor present Nurse Communication: Mobility status PT Visit Diagnosis: Difficulty in walking, not elsewhere classified (R26.2);Unsteadiness on feet (R26.81)    Time: 1131-1150 PT Time Calculation (min) (ACUTE ONLY): 19 min   Charges:   PT Evaluation $PT Eval Low Complexity: 1 Low         Kati PT, DPT Physical Therapist Acute Rehabilitation Services Preferred contact method: Secure Chat Weekend Pager Only: [phone] Office: [phone]  Kati L Payson 10/14/2022, 2:32 PM

## 2022-10-14 NOTE — Discharge Instructions (Signed)
Dr. Rod Can Joint Replacement Specialist [facility] [address], [place], [place] [address] [phone]   HIP REPLACEMENT POSTOPERATIVE DIRECTIONS    Hip Rehabilitation, Guidelines Following Surgery   WEIGHT BEARING Weight bearing as tolerated with assist device (walker, cane, etc) as directed, use it as long as suggested by your surgeon or therapist, typically at least 4-6 weeks.  The results of a hip operation are greatly improved after range of motion and muscle strengthening exercises. Follow all safety measures which are given to protect your hip. If any of these exercises cause increased pain or swelling in your joint, decrease the amount until you are comfortable again. Then slowly increase the exercises. Call your caregiver if you have problems or questions.   HOME CARE INSTRUCTIONS  Most of the following instructions are designed to prevent the dislocation of your new hip.  Remove items at home which could result in a fall. This includes throw rugs or furniture in walking pathways.  Continue medications as instructed at time of discharge. You may have some home medications which will be placed on hold until you complete the course of blood thinner medication. You may start showering once you are discharged home. Do not remove your dressing. Do not put on socks or shoes without following the instructions of your caregivers.   Sit on chairs with arms. Use the chair arms to help push yourself up when arising.  Arrange for the use of a toilet seat elevator so you are not sitting low.  Walk with walker as instructed.  You may resume a sexual relationship in one month or when given the OK by your caregiver.  Use walker as long as suggested by your caregivers.  You may put full weight on your legs and walk as much as is comfortable. Avoid periods of inactivity such as sitting longer than an hour when not asleep. This helps prevent blood clots.   You may return to work once you are cleared by [employer], production.  Do not drive a car for 6 weeks or until released by your surgeon.  Do not drive while taking narcotics.  Wear elastic stockings for two weeks following surgery during the day but you may remove then at night.  Make sure you keep all of your appointments after your operation with all of your doctors and caregivers. You should call the office at the above phone number and make an appointment for approximately two weeks after the date of your surgery. Please pick up a stool softener and laxative for home use as long as you are requiring pain medications. ICE to the affected hip every three hours for 30 minutes at a time and then as needed for pain and swelling. Continue to use ice on the hip for pain and swelling from surgery. You may notice swelling that will progress down to the foot and ankle.  This is normal after surgery.  Elevate the leg when you are not up walking on it.   It is important for you to complete the blood thinner medication as prescribed by your doctor. Continue to use the breathing machine which will help keep your temperature down.  It is common for your temperature to cycle up and down following surgery, especially at night when you are not up moving around and exerting yourself.  The breathing machine keeps your lungs expanded and your temperature down.  RANGE OF MOTION AND STRENGTHENING EXERCISES  These exercises are designed to help you keep  full movement of your hip joint. Follow your caregiver's or physical therapist's instructions. Perform all exercises about fifteen times, three times per day or as directed. Exercise both hips, even if you have had only one joint replacement. These exercises can be done on a training (exercise) mat, on the floor, on a table or on a bed. Use whatever works the best and is most comfortable for you. Use music or television while you are exercising so that the exercises are a pleasant  break in your day. This will make your life better with the exercises acting as a break in routine you can look forward to.  Lying on your back, slowly slide your foot toward your buttocks, raising your knee up off the floor. Then slowly slide your foot back down until your leg is straight again.  Lying on your back spread your legs as far apart as you can without causing discomfort.  Lying on your side, raise your upper leg and foot straight up from the floor as far as is comfortable. Slowly lower the leg and repeat.  Lying on your back, tighten up the muscle in the front of your thigh (quadriceps muscles). You can do this by keeping your leg straight and trying to raise your heel off the floor. This helps strengthen the largest muscle supporting your knee.  Lying on your back, tighten up the muscles of your buttocks both with the legs straight and with the knee bent at a comfortable angle while keeping your heel on the floor.   SKILLED REHAB INSTRUCTIONS: If the patient is transferred to a skilled rehab facility following release from the hospital, a list of the current medications will be sent to the facility for the patient to continue.  When discharged from the skilled rehab facility, please have the facility set up the patient's [place] prior to being released. Also, the skilled facility will be responsible for providing the patient with their medications at time of release from the facility to include their pain medication and their blood thinner medication. If the patient is still at the rehab facility at time of the two week follow up appointment, the skilled rehab facility will also need to assist the patient in arranging follow up appointment in our office and any transportation needs.  POST-OPERATIVE OPIOID TAPER INSTRUCTIONS: It is important to wean off of your opioid medication as soon as possible. If you do not need pain medication after your surgery it is ok to stop  day one. Opioids include: Codeine, Hydrocodone(Norco, Vicodin), Oxycodone(Percocet, oxycontin) and hydromorphone amongst others.  Long term and even short term use of opiods can cause: Increased pain response Dependence Constipation Depression Respiratory depression And more.  Withdrawal symptoms can include Flu like symptoms Nausea, vomiting And more Techniques to manage these symptoms Hydrate well Eat regular healthy meals Stay active Use relaxation techniques(deep breathing, meditating, yoga) Do Not substitute Alcohol to help with tapering If you have been on opioids for less than two weeks and do not have pain than it is ok to stop all together.  Plan to wean off of opioids This plan should start within one week post op of your joint replacement. Maintain the same interval or time between taking each dose and first decrease the dose.  Cut the total daily intake of opioids by one tablet each day Next start to increase the time between doses. The last dose that should be eliminated is the evening dose.    MAKE SURE  YOU:  Understand these instructions.  Will watch your condition.  Will get help right away if you are not doing well or get worse.  Pick up stool softner and laxative for home use following surgery while on pain medications. Do not remove your dressing. The dressing is waterproof--it is OK to take showers. Continue to use ice for pain and swelling after surgery. Do not use any lotions or creams on the incision until instructed by your surgeon. Total Hip Protocol.

## 2022-10-14 NOTE — TOC Progression Note (Signed)
Transition of Care [facility]) - Progression Note    Patient Details  Name: Autumn Wallace MRN: [id number] Date of Birth: September 22, 1932  Transition of Care [facility]) CM/SW [place], LCSW Phone Number: 10/14/2022, 1:37 PM  Clinical Narrative:    Met with pt's daughter in hallway to discuss discharge plans. Pt's daughter would like pt to return to [facility] with hospice services. Attending MD is in agreeance with this plan. Pt's daughter is aware of [facility] and prefers this agency for hospice services. She shares that this pt has a wheelchair and walker however, she needs to order new wheels for the walker. A referral has been made to [facility] for hospice services.  CSW left a voicemail with [facility] to discuss discharge plans and confirm patients return.    Expected Discharge Plan: Memory Care Barriers to Discharge: Barriers Resolved  Expected Discharge Plan and Services Expected Discharge Plan: Memory Care In-house Referral: NA Discharge Planning Services: CM Consult Post Acute Care Choice: Hospice Living arrangements for the past 2 months: [place]                 DME Arranged: N/A DME Agency: NA                   Social Determinants of Health (SDOH) Interventions    Readmission Risk Interventions    10/12/2022    2:40 PM  Readmission Risk Prevention Plan  Post Dischage Appt Complete  Medication Screening Complete  Transportation Screening Complete

## 2022-10-14 NOTE — Progress Notes (Signed)
    Subjective:  Patient pleasantly confused this morning due to underlying dementia. Alert, not oriented x3.  No reported events overnight.  Patient does not report any pain or have facial grimace.    Objective:   VITALS:   Vitals:   10/13/22 1800 10/13/22 1811 10/13/22 2008 10/14/22 0425  BP: 137/66 (!) 156/49 129/67 (!) 110/50  Pulse: 63 64 66 69  Resp: '17 18 16 16  '$ Temp:  98.3 F (36.8 C) 98.1 F (36.7 C) 98.2 F (36.8 C)  TempSrc:  Axillary Oral Oral  SpO2: 99% 100% 99% 100%  Weight:      Height:        Patient sleeping, woken up for examination. NAD. Alert, not oriented x3. Baseline per daughter.  She is able to follow basic commands. Exam limited due to dementia.  ABD soft Neurovascular intact Intact pulses distally Dorsiflexion/Plantar flexion intact Incision: dressing C/D/I No cellulitis present Compartment soft Painless log rolling of the hip, no facial grimacing or pain indicators.  Sensory unable to assess fully due to dementia, but she does move her foot in response to light touch.   Lab Results  Component Value Date   WBC 13.5 (H) 10/14/2022   HGB 11.7 (L) 10/14/2022   HCT 37.3 10/14/2022   MCV 91.6 10/14/2022   PLT 161 10/14/2022   BMET    Component Value Date/Time   NA 140 10/14/2022 0457   K 4.9 10/14/2022 0457   CL 109 10/14/2022 0457   CO2 23 10/14/2022 0457   GLUCOSE 147 (H) 10/14/2022 0457   BUN 24 (H) 10/14/2022 0457   CREATININE 1.05 (H) 10/14/2022 0457   CALCIUM 7.7 (L) 10/14/2022 0457   GFRNONAA 51 (L) 10/14/2022 0457     Assessment/Plan: 1 Day Post-Op   Principal Problem:   Closed left hip fracture, initial encounter ([place]) Active Problems:   Late onset Alzheimer's disease without behavioral disturbance (HCC)   Leukocytosis   Malnutrition of moderate degree   WBAT with walker DVT ppx: Aspirin, SCDs, TEDS PO pain control PT/OT: PT has not see yet. PT to come by today.  Dispo: Patient under care of the medical team,  disposition per their recommendation. Patient at memory care facility at baseline. Pain medication and DVT ppx printed in chart.    Charlott Rakes, PA-C 10/14/2022, 7:19 AM   [facility]  [facility] [place] [address], [address], [place], [place] [address] Phone: [phone] [email or web address] Facebook  [employer]

## 2022-10-14 NOTE — Progress Notes (Signed)
[facility])  Referral received for hospice at [facility] at d/c.  Spoke with dtr and Autumn Wallace, she confirms desire for hospice at d/c.  Discussed DME. Currently, needs are met with what is in place at facility. Discussed hospital bed, but Autumn Wallace would like to keep her current bed for now. Autumn Wallace would like a walker with wheels, [facility] will assess her once she is back at her facility and if this is a safe option, we will order the walker at that time.  At discharge, she will need EMS transport back to her facility.   Please send completed DNR and arrange for any comfort meds (if indicated) so there is no lapse in her comfort prior to hospice services beginning (often the day after d/c).  Thank you, Venia Carbon DNP, RN [facility] Liaison

## 2022-10-14 NOTE — Progress Notes (Signed)
PROGRESS NOTE    Autumn Wallace  EXN:[id number] DOB: June 05, 1932 DOA: 10/11/2022 PCP: Crist Infante, MD    Brief Narrative:  88 year old with history of advanced dementia and paroxysmal SVT from memory care unit brought with mechanical fall, left hip pain.  She was found to have nondisplaced acute left femoral neck fracture.  Admitted for surgical intervention. Patient underwent ORIF left hip 11/8.  Stable postop.   Assessment & Plan:   Closed traumatic left hip fracture: Left hip hemiarthroplasty 11/8, Dr. Lyla Glassing PT OT, will be very difficult participation. Adequate pain medications. DVT prophylaxis with aspirin 325 mg daily. Currently on around-the-clock Toradol 7.5 mg every 6 hours, as needed morphine and muscle relaxants.  Dementia with delirium: High risk.  Fall precautions.  Delirium precautions.  Patient is on Depakote, Lexapro, Namenda.  That is continued.  She is in a memory care unit.  Goal of care: Patient will be challenging for rehab. Detailed discussion with patient's daughter about rehab options, difficulties with rehab.  Moving to different facility. Family is interested in going back to memory care unit with hospice care.  This is appropriate. Updated social worker to explore option going back to memory care unit with hospice in place. CODE STATUS discussed.  She had surgery and now is stable, will change to no CPR status.  Nutrition Status: Nutrition Problem: Moderate Malnutrition Etiology: chronic illness (dementia) Signs/Symptoms: mild fat depletion, mild muscle depletion Interventions: MVI, Ensure Enlive (each supplement provides 350kcal and 20 grams of protein)     DVT prophylaxis: SCDs Start: 10/13/22 1854 SCDs Start: 10/12/22 0226   Code Status: DNR. Family Communication: Daughter on the phone. Disposition Plan: Status is: Inpatient Remains inpatient appropriate because: Immediate postop.     Consultants:  Orthopedics  Procedures:   None  Antimicrobials:  Perioperative   Subjective: Patient examined in the morning rounds.  Confused, irritable on exam.  Comfortable when left alone.  Objective: Vitals:   10/13/22 1800 10/13/22 1811 10/13/22 2008 10/14/22 0425  BP: 137/66 (!) 156/49 129/67 (!) 110/50  Pulse: 63 64 66 69  Resp: '17 18 16 16  '$ Temp:  49.6 F (36.8 C) 98.1 F (36.7 C) 98.2 F (36.8 C)  TempSrc:  Axillary Oral Oral  SpO2: 99% 100% 99% 100%  Weight:      Height:        Intake/Output Summary (Last 24 hours) at 10/14/2022 1256 Last data filed at 10/14/2022 0556 Gross per 24 hour  Intake 3126.71 ml  Output 200 ml  Net 2926.71 ml    Filed Weights   10/11/22 1641 10/12/22 0136  Weight: 58.1 kg 58.9 kg    Examination:  General exam: Appears calm and comfortable on bed.  Anxious on stimulation. Respiratory system: Clear to auscultation. Respiratory effort normal. Cardiovascular system: S1 & S2 heard, RRR.  Gastrointestinal system: Abdomen is nondistended, soft and nontender. No organomegaly or masses felt. Normal bowel sounds heard. Moves all extremities.  Left hip incision clean and dry.  Distal neurovascular status intact.    Data Reviewed: I have personally reviewed following labs and imaging studies  CBC: Recent Labs  Lab 10/11/22 1716 10/12/22 0642 10/13/22 0623 10/14/22 0457  WBC 12.3* 8.7 11.1* 13.5*  NEUTROABS 10.0*  --   --   --   HGB 12.8 12.7 13.1 11.7*  HCT 40.2 38.6 40.6 37.3  MCV 90.3 88.5 88.8 91.6  PLT 233 214 202 759    Basic Metabolic Panel: Recent Labs  Lab 10/11/22 1716 10/12/22 1638  10/13/22 0623 10/14/22 0457  NA 138 140 138 140  K 4.7 4.1 4.2 4.9  CL 103 108 107 109  CO2 '27 25 23 23  '$ GLUCOSE 123* 121* 109* 147*  BUN 24* 17 17 24*  CREATININE 1.00 0.85 0.82 1.05*  CALCIUM 9.8 8.8* 8.2* 7.7*    GFR: Estimated Creatinine Clearance: 32 mL/min (A) (by C-G formula based on SCr of 1.05 mg/dL (H)). Liver Function Tests: No results for input(s):  "AST", "ALT", "ALKPHOS", "BILITOT", "PROT", "ALBUMIN" in the last 168 hours. No results for input(s): "LIPASE", "AMYLASE" in the last 168 hours. No results for input(s): "AMMONIA" in the last 168 hours. Coagulation Profile: No results for input(s): "INR", "PROTIME" in the last 168 hours. Cardiac Enzymes: No results for input(s): "CKTOTAL", "CKMB", "CKMBINDEX", "TROPONINI" in the last 168 hours. BNP (last 3 results) No results for input(s): "PROBNP" in the last 8760 hours. HbA1C: No results for input(s): "HGBA1C" in the last 72 hours. CBG: Recent Labs  Lab 10/10/22 1048 10/12/22 1536  GLUCAP 88 111*    Lipid Profile: No results for input(s): "CHOL", "HDL", "LDLCALC", "TRIG", "CHOLHDL", "LDLDIRECT" in the last 72 hours. Thyroid Function Tests: No results for input(s): "TSH", "T4TOTAL", "FREET4", "T3FREE", "THYROIDAB" in the last 72 hours. Anemia Panel: No results for input(s): "VITAMINB12", "FOLATE", "FERRITIN", "TIBC", "IRON", "RETICCTPCT" in the last 72 hours. Sepsis Labs: No results for input(s): "PROCALCITON", "LATICACIDVEN" in the last 168 hours.  No results found for this or any previous visit (from the past 240 hour(s)).       Radiology Studies: DG HIP UNILAT WITH PELVIS 1V LEFT  Result Date: 10/13/2022 CLINICAL DATA:  Left hip surgery EXAM: DG HIP (WITH OR WITHOUT PELVIS) 1V*L*; DG C-ARM 1-60 MIN-NO REPORT COMPARISON:  Left femur 10/11/2022 FINDINGS: C-arm images of the left hip were obtained. Left hip arthroplasty with bipolar prosthesis. Prosthesis in good position. No fracture or complication. IMPRESSION: Left hip replacement without complication. Electronically Signed   By: Franchot Gallo M.D.   On: 10/13/2022 17:00   DG C-Arm 1-60 Min-No Report  Result Date: 10/13/2022 Fluoroscopy was utilized by the requesting physician.  No radiographic interpretation.        Scheduled Meds:  aspirin EC  325 mg Oral Q breakfast   cholecalciferol  2,000 Units Oral Daily    divalproex  125 mg Oral BID   docusate sodium  100 mg Oral BID   escitalopram  10 mg Oral Daily   feeding supplement  237 mL Oral Daily   feeding supplement  296 mL Oral Once   galantamine  16 mg Oral Daily   ketorolac  7.5 mg Intravenous Q6H   memantine  10 mg Oral BID   multivitamin with minerals  1 tablet Oral Daily   polyethylene glycol  17 g Oral Daily   senna  1 tablet Oral BID   Continuous Infusions:  sodium chloride 75 mL/hr at 10/13/22 2122   methocarbamol (ROBAXIN) IV       LOS: 2 days    Time spent: 35 minutes    Barb Merino, MD [facility] Pager [phone]

## 2022-10-14 NOTE — Evaluation (Signed)
Occupational Therapy Evaluation Patient Details Name: Autumn Wallace MRN: [id number] DOB: 07/12/1932 Today's Date: 10/14/2022   History of Present Illness Ms. Kuhlmann is a 88 yr old female admitted to the hospital with L hip pain after a fall. She was found to have a  L displaced femoral neck fracture and is s/p a L hip hemiarthroplasty. PMH: dementia   Clinical Impression   The patient presents with deficits relative to functional mobility, ADL performance, cognition due to baseline dementia, safety awareness, balance, and strength. She was disoriented x4 & appeared to have cognitive receptive deficits. She was very talkative, however her speech was mostly rambling and incoherent. She was able to maintain static sitting EOB without physical assistance, she was unable to clear her buttocks off the bed in order to stand, and she drank water from a cup in sitting after the cup was placed in her hand. Without further OT services, she is at risk for progressive functional decline and restricted ADL participation.      Recommendations for follow up therapy are one component of a multi-disciplinary discharge planning process, led by the attending physician.  Recommendations may be updated based on patient status, additional functional criteria and insurance authorization.   Follow Up Recommendations  Skilled nursing-short term rehab (<3 hours/day)    Assistance Recommended at Discharge Frequent or constant Supervision/Assistance  Patient can return home with the following Direct supervision/assist for medications management;Direct supervision/assist for financial management;Assist for transportation;Assistance with feeding;A lot of help with walking and/or transfers;A lot of help with bathing/dressing/bathroom    Functional Status Assessment  Patient has had a recent decline in their functional status and demonstrates the ability to make significant improvements in function in a reasonable and  predictable amount of time.  Equipment Recommendations  Other (comment)       Precautions / Restrictions Precautions Precautions: Fall Restrictions Weight Bearing Restrictions: Yes LLE Weight Bearing: Weight bearing as tolerated Other Position/Activity Restrictions: L LE      Mobility Bed Mobility Overal bed mobility: Needs Assistance Bed Mobility: Supine to Sit, Sit to Supine     Supine to sit: Total assist Sit to supine: Total assist   General bed mobility comments: pt presented with significant difficulty following commands needed to perform bed mobility Patient Response: Anxious  Transfers    General transfer comment: sit to stand was attempted, however the pt was unable to clear her buttocks off the bed in order to achieve      Balance       Sitting balance - Comments: static sitting-good. dynamic sitting-not assessed           ADL either performed or assessed with clinical judgement   ADL Overall ADL's : Needs assistance/impaired Eating/Feeding: Minimal assistance;Sitting Eating/Feeding Details (indicate cue type and reason): based on clinical judgement             Upper Body Dressing : Maximal assistance;Bed level   Lower Body Dressing: Total assistance;Bed level                        Pertinent Vitals/Pain Pain Assessment Pain Assessment: Faces Pain Score: 0-No pain     Extremity/Trunk Assessment Upper Extremity Assessment Upper Extremity Assessment: Difficult to assess due to impaired cognition   Lower Extremity Assessment Lower Extremity Assessment: Difficult to assess due to impaired cognition       Communication     Cognition Arousal/Alertness: Awake/alert Behavior During Therapy: Anxious Overall Cognitive Status: History of  cognitive impairments - at baseline Area of Impairment: Orientation, Attention, Memory, Following commands, Safety/judgement, Awareness, Problem solving        Orientation Level: Disoriented to,  Person, Place, Time, Situation   Memory: Decreased recall of precautions, Decreased short-term memory Following Commands: Follows one step commands inconsistently Safety/Judgement: Decreased awareness of safety, Decreased awareness of deficits   Problem Solving: Decreased initiation, Difficulty sequencing, Requires tactile cues, Requires verbal cues General Comments: decreased command follow, occasional agitation, incoherent non-sensical speech                Home Living Family/patient expects to be discharged to:: Other (Comment) (Per her medical records, she is from a memory care unit)         Prior Functioning/Environment Prior Level of Function : Patient poor historian/Family not available             Mobility Comments: Unknown as the patient was unable to provide information regarding her prior level of functioning and living situation, due to her impaired cognition. ADLs Comments: Unknown as the patient was unable to provide information regarding her prior level of functioning and living situation, due to her impaired cognition.        OT Problem List: Decreased strength;Decreased activity tolerance;Impaired balance (sitting and/or standing);Decreased cognition;Decreased safety awareness;Decreased knowledge of use of DME or AE;Decreased knowledge of precautions      OT Treatment/Interventions: Self-care/ADL training;Therapeutic activities;Therapeutic exercise;Cognitive remediation/compensation;Energy conservation;Patient/family education;DME and/or AE instruction;Balance training    OT Goals(Current goals can be found in the care plan section) Acute Rehab OT Goals OT Goal Formulation: Patient unable to participate in goal setting Time For Goal Achievement: 10/28/22 Potential to Achieve Goals:  (Guarded) ADL Goals Pt Will Perform Eating: with supervision;sitting Pt Will Perform Grooming: with min assist;sitting Pt Will Perform Upper Body Dressing: with min  assist;sitting Pt Will Transfer to Toilet: with min assist;ambulating  OT Frequency: Min 2X/week       AM-PAC OT "6 Clicks" Daily Activity     Outcome Measure Help from another person eating meals?: A Little Help from another person taking care of personal grooming?: A Lot Help from another person toileting, which includes using toliet, bedpan, or urinal?: Total Help from another person bathing (including washing, rinsing, drying)?: Total Help from another person to put on and taking off regular upper body clothing?: A Lot Help from another person to put on and taking off regular lower body clothing?: Total 6 Click Score: 10   End of Session Equipment Utilized During Treatment: Gait belt;Rolling walker (2 wheels) Nurse Communication: Mobility status  Activity Tolerance: Other (comment) (Patient limited by impaired cognition) Patient left: in bed;with call bell/phone within reach;with bed alarm set  OT Visit Diagnosis: Muscle weakness (generalized) (M62.81);Cognitive communication deficit (R41.841);History of falling (Z91.81)                Time: 8657-8469 OT Time Calculation (min): 20 min Charges:  OT General Charges $OT Visit: 1 Visit OT Evaluation $OT Eval Moderate Complexity: 1 Mod    Demetrious Rainford L Manish Ruggiero, OTR/L 10/14/2022, 12:53 PM

## 2022-10-15 DIAGNOSIS — S72002A Fracture of unspecified part of neck of left femur, initial encounter for closed fracture: Secondary | ICD-10-CM | POA: Diagnosis not present

## 2022-10-15 LAB — CBC
HCT: 31.6 % — ABNORMAL LOW (ref 36.0–46.0)
Hemoglobin: 10.1 g/dL — ABNORMAL LOW (ref 12.0–15.0)
MCH: 29.6 pg (ref 26.0–34.0)
MCHC: 32 g/dL (ref 30.0–36.0)
MCV: 92.7 fL (ref 80.0–100.0)
Platelets: 115 10*3/uL — ABNORMAL LOW (ref 150–400)
RBC: 3.41 MIL/uL — ABNORMAL LOW (ref 3.87–5.11)
RDW: 13.9 % (ref 11.5–15.5)
WBC: 11.2 10*3/uL — ABNORMAL HIGH (ref 4.0–10.5)
nRBC: 0 % (ref 0.0–0.2)

## 2022-10-15 LAB — BASIC METABOLIC PANEL
Anion gap: 3 — ABNORMAL LOW (ref 5–15)
BUN: 24 mg/dL — ABNORMAL HIGH (ref 8–23)
CO2: 25 mmol/L (ref 22–32)
Calcium: 7.2 mg/dL — ABNORMAL LOW (ref 8.9–10.3)
Chloride: 110 mmol/L (ref 98–111)
Creatinine, Ser: 0.7 mg/dL (ref 0.44–1.00)
GFR, Estimated: >60 mL/min (ref >60)
Glucose, Bld: 129 mg/dL — ABNORMAL HIGH (ref 70–99)
Potassium: 5 mmol/L (ref 3.5–5.1)
Sodium: 138 mmol/L (ref 135–145)

## 2022-10-15 NOTE — Progress Notes (Signed)
Report called to RN at [facility].

## 2022-10-15 NOTE — Care Management Important Message (Signed)
Important Message  Patient Details IM Letter placed in Patient's room. Name: Autumn Wallace MRN: [id number] Date of Birth: 20-Apr-1932   Medicare Important Message Given:  Yes     Kerin Salen 10/15/2022, 1:07 PM

## 2022-10-15 NOTE — Progress Notes (Signed)
    Subjective:  Patient pleasantly confused this morning due to underlying dementia. Alert, not oriented x3.  No reported events overnight.  Patient does not report any pain or have facial grimace with movement. She is lying on operative side this morning.   Objective:   VITALS:   Vitals:   10/14/22 0425 10/14/22 1346 10/14/22 1951 10/15/22 0535  BP: (!) 110/50 (!) 109/94 (!) 134/115 106/60  Pulse: 69 76 66 69  Resp: '16 19 18 20  '$ Temp: 98.2 F (36.8 C) 98.3 F (36.8 C) 98.6 F (37 C) 98.6 F (37 C)  TempSrc: Oral Oral Oral Oral  SpO2: 100% 100% 100% 99%  Weight:      Height:        Patient sleeping, woken up for examination. NAD. Alert, not oriented x3. She is able to follow basic commands. Exam limited due to dementia.  ABD soft Neurovascular intact Intact pulses distally Dorsiflexion/Plantar flexion intact Incision: dressing C/D/I No cellulitis present Compartment soft Painless log rolling of the hip, no facial grimacing or pain indicators.  Sensory unable to assess fully due to dementia, but she does move her foot in response to light touch.   Lab Results  Component Value Date   WBC 11.2 (H) 10/15/2022   HGB 10.1 (L) 10/15/2022   HCT 31.6 (L) 10/15/2022   MCV 92.7 10/15/2022   PLT 115 (L) 10/15/2022   BMET    Component Value Date/Time   NA 138 10/15/2022 0453   K 5.0 10/15/2022 0453   CL 110 10/15/2022 0453   CO2 25 10/15/2022 0453   GLUCOSE 129 (H) 10/15/2022 0453   BUN 24 (H) 10/15/2022 0453   CREATININE 0.70 10/15/2022 0453   CALCIUM 7.2 (L) 10/15/2022 0453   GFRNONAA >60 10/15/2022 0453     Assessment/Plan: 2 Days Post-Op   Principal Problem:   Closed left hip fracture, initial encounter ([place]) Active Problems:   Late onset Alzheimer's disease without behavioral disturbance (HCC)   Leukocytosis   Malnutrition of moderate degree   WBAT with walker DVT ppx: Aspirin, SCDs, TEDS PO pain control PT/OT: Continue to work with PT.  Dispo:  Patient under care of the medical team, disposition per their recommendation. Patient at memory care facility at baseline discussion about SNF. Pain medication and DVT ppx printed in chart.    Charlott Rakes, PA-C 10/15/2022, 12:16 PM   [facility]  [place] [address], [address], [place], [place] [address] Phone: [phone] [email or web address] Facebook  [employer]

## 2022-10-15 NOTE — TOC Transition Note (Signed)
Transition of Care [facility]) - CM/SW Discharge Note   Patient Details  Name: Autumn Wallace MRN: [id number] Date of Birth: [birth year]  Transition of Care [facility]) CM/SW Contact:  Vassie Moselle, [place] Phone Number: 10/15/2022, 1:29 PM   Clinical Narrative:    Pt is to return to [facility] with Hospice services through [place]. CSW spoke with [facility] to confirm discharge plans. FL-2 and DC summary have been faxed to [facility] for review. CSW spoke with pt's daughter and confirmed discharge plans. RN to call report to [phone]. Pt will be transported to facility via [facility].    Final next level of care: Memory Care [facility]) Barriers to Discharge: Barriers Resolved   Patient Goals and CMS Choice Patient states their goals for this hospitalization and ongoing recovery are:: To return to [facility] CMS Medicare.gov Compare Post Acute Care list provided to:: Patient Represenative (must comment) (Daughter) Choice offered to / list presented to : Adult Children  Discharge Placement              Patient chooses bed at: [facility] [place] Patient to be transferred to facility by: [place] Name of family member notified: [place] April Patient and family notified of of transfer: 10/15/22  Discharge Plan and Services In-house Referral: NA Discharge Planning Services: CM Consult Post Acute Care Choice: Hospice          DME Arranged: N/A DME Agency: NA                  Social Determinants of Health ([place]) Interventions     Readmission Risk Interventions    10/15/2022    1:28 PM 10/12/2022    2:40 PM  Readmission Risk Prevention Plan  Post Dischage Appt  Complete  Medication Screening  Complete  Transportation Screening Complete Complete  PCP or Specialist Appt within 5-7 Days Complete   Home Care Screening Complete   Medication Review (RN CM) Complete

## 2022-10-15 NOTE — Progress Notes (Signed)
PROGRESS NOTE    Autumn Wallace  BJS:[id number] DOB: February 17, 1932 DOA: 10/11/2022 PCP: Crist Infante, MD    Brief Narrative:  88 year old with history of advanced dementia and paroxysmal SVT from memory care unit brought with mechanical fall, left hip pain.  She was found to have nondisplaced acute left femoral neck fracture.  Admitted for surgical intervention. Patient underwent ORIF left hip 11/8.  Stable postop.   Assessment & Plan:   Closed traumatic left hip fracture: Left hip hemiarthroplasty 11/8, Dr. Lyla Glassing PT OT, difficulty with rehab and participation. Adequate pain medications. DVT prophylaxis with aspirin 325 mg daily. Currently on around-the-clock Toradol 7.5 mg every 6 hours, as needed morphine and muscle relaxants.  Dementia with delirium: High risk.  Fall precautions.  Delirium precautions.  Patient is on Depakote, Lexapro, Namenda.  That is continued.  She is in a memory care unit.  Goal of care: Patient will be challenging for rehab. Family requested going back to memory care unit with hospice care.   Will discharge back to memory care unit when they are able to accept her care.  Nutrition Status: Nutrition Problem: Moderate Malnutrition Etiology: chronic illness (dementia) Signs/Symptoms: mild fat depletion, mild muscle depletion Interventions: MVI, Ensure Enlive (each supplement provides 350kcal and 20 grams of protein)     DVT prophylaxis: SCDs Start: 10/13/22 1854 SCDs Start: 10/12/22 0226   Code Status: DNR. Family Communication: Daughter on the phone 11/9 Disposition Plan: Status is: Inpatient Remains inpatient appropriate because: Immediate postop.  Disposition pending.     Consultants:  [facility]th[facility]  Procedures:  Left hip hemiarthroplasty  Antimicrobials:  Perioperative   Subjective: Seen and examined.  Pleasantly confused.  Denying any pain.  Resistant to movement.  Objective: Vitals:   10/14/22 0425 10/14/22  1346 10/14/22 1951 10/15/22 0535  BP: (!) 110/50 (!) 109/94 (!) 134/115 106/60  Pulse: 69 76 66 69  Resp: '16 19 18 20  '$ Temp: 98.2 F (36.8 C) 98.3 F (36.8 C) 98.6 F (37 C) 98.6 F (37 C)  TempSrc: Oral Oral Oral Oral  SpO2: 100% 100% 100% 99%  Weight:      Height:        Intake/Output Summary (Last 24 hours) at 10/15/2022 1100 Last data filed at 10/15/2022 1022 Gross per 24 hour  Intake 1745.37 ml  Output 400 ml  Net 1345.37 ml   Filed Weights   10/11/22 1641 10/12/22 0136  Weight: 58.1 kg 58.9 kg    Examination:  General exam: Appears calm and comfortable on bed.  Mostly sleepy.  Unable to interact. Respiratory system: Clear to auscultation. Respiratory effort normal. Cardiovascular system: S1 & S2 heard, RRR.  Gastrointestinal system: Abdomen is nondistended, soft and nontender. No organomegaly or masses felt. Normal bowel sounds heard. Moves all extremities.  Left hip incision clean and dry.  Distal neurovascular status intact.    Data Reviewed: I have personally reviewed following labs and imaging studies  CBC: Recent Labs  Lab 10/11/22 1716 10/12/22 0642 10/13/22 0623 10/14/22 0457 10/15/22 0453  WBC 12.3* 8.7 11.1* 13.5* 11.2*  NEUTROABS 10.0*  --   --   --   --   HGB 12.8 12.7 13.1 11.7* 10.1*  HCT 40.2 38.6 40.6 37.3 31.6*  MCV 90.3 88.5 88.8 91.6 92.7  PLT 233 214 202 161 607*   Basic Metabolic Panel: Recent Labs  Lab 10/11/22 1716 10/12/22 0642 10/13/22 0623 10/14/22 0457 10/15/22 0453  NA 138 140 138 140 138  K 4.7 4.1  4.2 4.9 5.0  CL 103 108 107 109 110  CO2 '27 25 23 23 25  '$ GLUCOSE 123* 121* 109* 147* 129*  BUN 24* 17 17 24* 24*  CREATININE 1.00 0.85 0.82 1.05* 0.70  CALCIUM 9.8 8.8* 8.2* 7.7* 7.2*   GFR: Estimated Creatinine Clearance: 42 mL/min (by C-G formula based on SCr of 0.7 mg/dL). Liver Function Tests: No results for input(s): "AST", "ALT", "ALKPHOS", "BILITOT", "PROT", "ALBUMIN" in the last 168 hours. No results for  input(s): "LIPASE", "AMYLASE" in the last 168 hours. No results for input(s): "AMMONIA" in the last 168 hours. Coagulation Profile: No results for input(s): "INR", "PROTIME" in the last 168 hours. Cardiac Enzymes: No results for input(s): "CKTOTAL", "CKMB", "CKMBINDEX", "TROPONINI" in the last 168 hours. BNP (last 3 results) No results for input(s): "PROBNP" in the last 8760 hours. HbA1C: No results for input(s): "HGBA1C" in the last 72 hours. CBG: Recent Labs  Lab 10/10/22 1048 10/12/22 1536  GLUCAP 88 111*   Lipid Profile: No results for input(s): "CHOL", "HDL", "LDLCALC", "TRIG", "CHOLHDL", "LDLDIRECT" in the last 72 hours. Thyroid Function Tests: No results for input(s): "TSH", "T4TOTAL", "FREET4", "T3FREE", "THYROIDAB" in the last 72 hours. Anemia Panel: No results for input(s): "VITAMINB12", "FOLATE", "FERRITIN", "TIBC", "IRON", "RETICCTPCT" in the last 72 hours. Sepsis Labs: No results for input(s): "PROCALCITON", "LATICACIDVEN" in the last 168 hours.  No results found for this or any previous visit (from the past 240 hour(s)).       Radiology Studies: DG HIP UNILAT WITH PELVIS 1V LEFT  Result Date: 10/13/2022 CLINICAL DATA:  Left hip surgery EXAM: DG HIP (WITH OR WITHOUT PELVIS) 1V*L*; DG C-ARM 1-60 MIN-NO REPORT COMPARISON:  Left femur 10/11/2022 FINDINGS: C-arm images of the left hip were obtained. Left hip arthroplasty with bipolar prosthesis. Prosthesis in good position. No fracture or complication. IMPRESSION: Left hip replacement without complication. Electronically Signed   By: Franchot Gallo M.D.   On: 10/13/2022 17:00   DG C-Arm 1-60 Min-No Report  Result Date: 10/13/2022 Fluoroscopy was utilized by the requesting physician.  No radiographic interpretation.        Scheduled Meds:  aspirin EC  325 mg Oral Q breakfast   cholecalciferol  2,000 Units Oral Daily   divalproex  125 mg Oral BID   docusate sodium  100 mg Oral BID   escitalopram  10 mg Oral  Daily   feeding supplement  237 mL Oral Daily   feeding supplement  296 mL Oral Once   galantamine  16 mg Oral Daily   memantine  10 mg Oral BID   multivitamin with minerals  1 tablet Oral Daily   polyethylene glycol  17 g Oral Daily   senna  1 tablet Oral BID   Continuous Infusions:  sodium chloride 75 mL/hr at 10/14/22 2337   methocarbamol (ROBAXIN) IV       LOS: 3 days    Time spent: 35 minutes    Barb Merino, MD [facility] Pager [phone]

## 2022-10-15 NOTE — Discharge Summary (Signed)
Physician Discharge Summary  Autumn Wallace ZOX:[id number] DOB: [birth year] DOA: 10/11/2022  PCP: Crist Infante, MD  Admit date: 10/11/2022 Discharge date: 10/15/2022  Admitted From: ALF Disposition:  ALF  Recommendations for Outpatient Follow-up:  Continue supportive care   Home Health:NA  Equipment/Devices:NA, available at ALF   Discharge Condition:Fair   CODE STATUS:DNR Diet recommendation: regular diet   Discharge Summary: 88 year old with history of advanced dementia and paroxysmal SVT from memory care unit brought with mechanical fall, left hip pain.  She was found to have nondisplaced acute left femoral neck fracture.  Admitted for surgical intervention. Patient underwent ORIF left hip 11/8.  Stable postop. Severe dementia and unable to participate in therapies. Decided to go back to memory care unit with hospice care in place.   Assessment & Plan of care:    Closed traumatic left hip fracture: Left hip hemiarthroplasty 11/8, Dr. Lyla Glassing PT OT, difficulty with rehab and participation. Adequate pain medications. DVT prophylaxis with aspirin 325 mg daily. Currently on as needed pain medications, see even can not ask for pain medicine.  She will use as needed oxycodone.   Dementia with delirium: High risk.  Fall precautions.  Delirium precautions.  Patient is on Depakote, Lexapro, Namenda.  That is continued.  She is in a memory care unit.   Goal of care: Patient will be challenging for rehab. Family requested going back to memory care unit with hospice care.   discharge back to memory care unit when they are able to accept her care and provide hospice care.  Stable for transfer.   Discharge Diagnoses:  Principal Problem:   Closed left hip fracture, initial encounter [facility]) Active Problems:   Late onset Alzheimer's disease without behavioral disturbance (HCC)   Leukocytosis   Malnutrition of moderate degree    Discharge Instructions  Discharge Instructions      Diet general   Complete by: As directed    Increase activity slowly   Complete by: As directed    Leave dressing on - Keep it clean, dry, and intact until clinic visit   Complete by: As directed       Allergies as of 10/15/2022       Reactions   Fosamax [alendronate] Other (See Comments)   Unknown reaction Listed as allergy on MAR   Latex Other (See Comments)   Unknown reaction Listed as allergy on MAR   Tetanus Toxoids Other (See Comments)   Unknown reaction Listed as allergy on MAR   Vibra-tab [doxycycline] Other (See Comments)   Unknown reaction Listed as allergy on MAR        Medication List     STOP taking these medications    ipratropium 0.06 % nasal spray Commonly known as: ATROVENT   vitamin B-12 500 MCG tablet Commonly known as: CYANOCOBALAMIN       TAKE these medications    acetaminophen 325 MG tablet Commonly known as: Tylenol Take 2 tablets (650 mg total) by mouth every 6 (six) hours as needed for up to 30 doses for moderate pain or mild pain.   aspirin 81 MG chewable tablet Commonly known as: Aspirin Childrens Chew 1 tablet (81 mg total) by mouth 2 (two) times daily with a meal.   CENTRUM SILVER 50+WOMEN PO Take 1 tablet by mouth daily.   cholecalciferol 25 MCG (1000 UNIT) tablet Commonly known as: VITAMIN D3 Take 2,000 Units by mouth daily.   Cranberry Plus Vitamin C 4200-20-3 MG-MG-UNIT Caps Generic drug: Cranberry-Vitamin C-Vitamin E Take 1  capsule by mouth daily.   divalproex 125 MG capsule Commonly known as: DEPAKOTE SPRINKLE Take 125 mg by mouth 2 (two) times daily.   docusate sodium 100 MG capsule Commonly known as: COLACE Take 200 mg by mouth daily.   Ensure Take 237 mLs by mouth daily. May also drink an additional 237 ml by mouth as needed after dinner if the resident does not eat a full meal   escitalopram 10 MG tablet Commonly known as: LEXAPRO Take 10 mg by mouth daily.   fluticasone 50 MCG/ACT nasal  spray Commonly known as: FLONASE Place 1 spray into both nostrils in the morning and at bedtime.   galantamine 16 MG 24 hr capsule Commonly known as: RAZADYNE ER Take 16 mg by mouth daily.   HYDROcodone-acetaminophen 5-325 MG tablet Commonly known as: NORCO/VICODIN Take 0.5-1 tablets by mouth every 4 (four) hours as needed for up to 7 days for severe pain.   memantine 10 MG tablet Commonly known as: NAMENDA Take 10 mg by mouth 2 (two) times daily.   polyethylene glycol powder 17 GM/SCOOP powder Commonly known as: GLYCOLAX/MIRALAX Take 17 g by mouth daily.               Discharge Care Instructions  (From admission, onward)           Start     Ordered   10/15/22 0000  Leave dressing on - Keep it clean, dry, and intact until clinic visit        10/15/22 1255            Follow-up Information     Humphreys, Marciano Sequin, PA-C Follow up in 2 week(s).   Specialty: Orthopedic Surgery Why: For suture removal, For wound re-check Contact information: [address] [place] [address] [phone]                Allergies  Allergen Reactions   Fosamax [Alendronate] Other (See Comments)    Unknown reaction Listed as allergy on MAR   Latex Other (See Comments)    Unknown reaction Listed as allergy on MAR   Tetanus Toxoids Other (See Comments)    Unknown reaction Listed as allergy on MAR   Vibra-Tab [Doxycycline] Other (See Comments)    Unknown reaction Listed as allergy on MAR    Consultations: Orthopedics   Procedures/Studies: DG HIP UNILAT WITH PELVIS 1V LEFT  Result Date: 10/13/2022 CLINICAL DATA:  Left hip surgery EXAM: DG HIP (WITH OR WITHOUT PELVIS) 1V*L*; DG C-ARM 1-60 MIN-NO REPORT COMPARISON:  Left femur 10/11/2022 FINDINGS: C-arm images of the left hip were obtained. Left hip arthroplasty with bipolar prosthesis. Prosthesis in good position. No fracture or complication. IMPRESSION: Left hip replacement without complication.  Electronically Signed   By: Franchot Gallo M.D.   On: 10/13/2022 17:00   DG C-Arm 1-60 Min-No Report  Result Date: 10/13/2022 Fluoroscopy was utilized by the requesting physician.  No radiographic interpretation.   CT PELVIS WO CONTRAST  Result Date: 10/11/2022 CLINICAL DATA:  Fall EXAM: CT PELVIS WITHOUT CONTRAST TECHNIQUE: Multidetector CT imaging of the pelvis was performed following the standard protocol without intravenous contrast. RADIATION DOSE REDUCTION: This exam was performed according to the departmental dose-optimization program which includes automated exposure control, adjustment of the mA and/or kV according to patient size and/or use of iterative reconstruction technique. COMPARISON:  Pelvic radiograph 10/11/2022 FINDINGS: Urinary Tract:  The bladder is unremarkable Bowel: Diverticular disease of the sigmoid colon without acute bowel wall thickening. Vascular/Lymphatic:  No suspicious lymph nodes. Moderate atherosclerosis without aneurysm Reproductive:  Uterus unremarkable.  No adnexal mass Other:  Negative for pelvic effusion. Musculoskeletal: Pubic symphysis is intact. Old right inferior pubic ramus fracture. No dislocation. Mild to moderate arthritis of the bilateral hips. Acute nondisplaced fracture involving the left femoral neck, coronal series 7, image 75, axial series 3 image 88 through 97 and sagittal series 6 image 39-41. IMPRESSION: Acute nondisplaced left femoral neck fracture. Diverticular disease of sigmoid colon without acute inflammation Electronically Signed   By: Donavan Foil M.D.   On: 10/11/2022 21:49   DG Knee Complete 4 Views Left  Result Date: 10/11/2022 CLINICAL DATA:  Leg pain after fall; has advanced dementia EXAM: LEFT KNEE - COMPLETE 4+ VIEW; PELVIS - 1-2 VIEW; LEFT FEMUR 2 VIEWS COMPARISON:  Radiographs 10/10/2022 FINDINGS: Demineralization. Irregularity about the right inferior pubic ramus is unchanged from 10/10/2022 may represent a old fracture. Otherwise  no definite fracture or dislocation in the hips or pelvis. No acute fracture in the femur. No acute fracture or dislocation of the left knee. No knee joint effusion. Tricompartmental degenerative arthritis. Patellar enthesophytes. IMPRESSION: No definite acute fracture in the pelvis, hips, left femur or left knee. Electronically Signed   By: Placido Sou M.D.   On: 10/11/2022 19:03   DG FEMUR MIN 2 VIEWS LEFT  Result Date: 10/11/2022 CLINICAL DATA:  Leg pain after fall; has advanced dementia EXAM: LEFT KNEE - COMPLETE 4+ VIEW; PELVIS - 1-2 VIEW; LEFT FEMUR 2 VIEWS COMPARISON:  Radiographs 10/10/2022 FINDINGS: Demineralization. Irregularity about the right inferior pubic ramus is unchanged from 10/10/2022 may represent a old fracture. Otherwise no definite fracture or dislocation in the hips or pelvis. No acute fracture in the femur. No acute fracture or dislocation of the left knee. No knee joint effusion. Tricompartmental degenerative arthritis. Patellar enthesophytes. IMPRESSION: No definite acute fracture in the pelvis, hips, left femur or left knee. Electronically Signed   By: Placido Sou M.D.   On: 10/11/2022 19:03   DG Pelvis 1-2 Views  Result Date: 10/11/2022 CLINICAL DATA:  Leg pain after fall; has advanced dementia EXAM: LEFT KNEE - COMPLETE 4+ VIEW; PELVIS - 1-2 VIEW; LEFT FEMUR 2 VIEWS COMPARISON:  Radiographs 10/10/2022 FINDINGS: Demineralization. Irregularity about the right inferior pubic ramus is unchanged from 10/10/2022 may represent a old fracture. Otherwise no definite fracture or dislocation in the hips or pelvis. No acute fracture in the femur. No acute fracture or dislocation of the left knee. No knee joint effusion. Tricompartmental degenerative arthritis. Patellar enthesophytes. IMPRESSION: No definite acute fracture in the pelvis, hips, left femur or left knee. Electronically Signed   By: Placido Sou M.D.   On: 10/11/2022 19:03   DG Chest Portable 1 View  Result Date:  10/10/2022 CLINICAL DATA:  Fall EXAM: PORTABLE CHEST 1 VIEW COMPARISON:  05/17/2022 and prior studies FINDINGS: The cardiomediastinal silhouette is unchanged. A hiatal hernia is again noted. There is no evidence of focal airspace disease, pulmonary edema, suspicious pulmonary nodule/mass, pleural effusion, or pneumothorax. Minimal LEFT basilar atelectasis/scarring noted. No acute bony abnormalities are identified. Remote rib and RIGHT clavicle fractures again identified. IMPRESSION: 1. No acute disease. 2. Hiatal hernia. Electronically Signed   By: Margarette Canada M.D.   On: 10/10/2022 10:19   CT Head Wo Contrast  Result Date: 10/10/2022 CLINICAL DATA:  Provided history: Neck trauma. Head trauma, minor. Unwitnessed fall. EXAM: CT HEAD WITHOUT CONTRAST CT CERVICAL SPINE WITHOUT CONTRAST TECHNIQUE: Multidetector CT imaging of the head and  cervical spine was performed following the standard protocol without intravenous contrast. Multiplanar CT image reconstructions of the cervical spine were also generated. RADIATION DOSE REDUCTION: This exam was performed according to the departmental dose-optimization program which includes automated exposure control, adjustment of the mA and/or kV according to patient size and/or use of iterative reconstruction technique. COMPARISON:  Brain MRI 05/17/2022. Head CT 05/17/2022. CT cervical spine 11/05/2021. FINDINGS: CT HEAD FINDINGS Brain: Moderate cerebral atrophy. Mild patchy and ill-defined hypoattenuation within the cerebral white matter, nonspecific but compatible with chronic small ischemic disease. There is no acute intracranial hemorrhage. No demarcated cortical infarct. No extra-axial fluid collection. No evidence of an intracranial mass. No midline shift. Vascular: No hyperdense vessel.  Atherosclerotic calcifications. Skull: No fracture or aggressive osseous lesion. Sinuses/Orbits: No mass or acute finding within the imaged orbits. No significant paranasal sinus disease at  the imaged levels. CT CERVICAL SPINE FINDINGS Alignment: No significant spondylolisthesis. Skull base and vertebrae: Cervical vertebral body height is maintained. No evidence of acute fracture to the cervical spine. Subtle chronic T1 anterior wedge vertebral compression deformity. Mild T3 superior endplate compression deformity. These compression deformities are unchanged as compared to the prior cervical spine CT of 11/05/2021. Soft tissues and spinal canal: No prevertebral soft tissue swelling or visible canal hematoma. Multinodular thyroid gland, previously assessed by ultrasound in 2012. No follow-up imaging is recommended. Disc levels: Mild for age cervical spondylosis. No appreciable high-grade spinal canal stenosis. No high-grade bony neural foraminal narrowing. Osseous fusion across the disc space at T3-T4. Upper chest: No consolidation within the imaged lung apices. No visible pneumothorax. IMPRESSION: CT head: 1. No evidence of acute intracranial abnormality. 2. Mild chronic small vessel changes within the cerebral white matter. 3. Moderate generalized cerebral atrophy. CT cervical spine: 1. No evidence of acute fracture to the cervical spine. 2. Mild chronic T1 and T3 vertebral compression deformities, unchanged from the prior cervical spine CT of 11/05/2021. 3. Mild for age cervical spondylosis. Electronically Signed   By: Kellie Simmering D.O.   On: 10/10/2022 10:18   CT Cervical Spine Wo Contrast  Result Date: 10/10/2022 CLINICAL DATA:  Provided history: Neck trauma. Head trauma, minor. Unwitnessed fall. EXAM: CT HEAD WITHOUT CONTRAST CT CERVICAL SPINE WITHOUT CONTRAST TECHNIQUE: Multidetector CT imaging of the head and cervical spine was performed following the standard protocol without intravenous contrast. Multiplanar CT image reconstructions of the cervical spine were also generated. RADIATION DOSE REDUCTION: This exam was performed according to the departmental dose-optimization program which  includes automated exposure control, adjustment of the mA and/or kV according to patient size and/or use of iterative reconstruction technique. COMPARISON:  Brain MRI 05/17/2022. Head CT 05/17/2022. CT cervical spine 11/05/2021. FINDINGS: CT HEAD FINDINGS Brain: Moderate cerebral atrophy. Mild patchy and ill-defined hypoattenuation within the cerebral white matter, nonspecific but compatible with chronic small ischemic disease. There is no acute intracranial hemorrhage. No demarcated cortical infarct. No extra-axial fluid collection. No evidence of an intracranial mass. No midline shift. Vascular: No hyperdense vessel.  Atherosclerotic calcifications. Skull: No fracture or aggressive osseous lesion. Sinuses/Orbits: No mass or acute finding within the imaged orbits. No significant paranasal sinus disease at the imaged levels. CT CERVICAL SPINE FINDINGS Alignment: No significant spondylolisthesis. Skull base and vertebrae: Cervical vertebral body height is maintained. No evidence of acute fracture to the cervical spine. Subtle chronic T1 anterior wedge vertebral compression deformity. Mild T3 superior endplate compression deformity. These compression deformities are unchanged as compared to the prior cervical spine CT of 11/05/2021. Soft  tissues and spinal canal: No prevertebral soft tissue swelling or visible canal hematoma. Multinodular thyroid gland, previously assessed by ultrasound in 2012. No follow-up imaging is recommended. Disc levels: Mild for age cervical spondylosis. No appreciable high-grade spinal canal stenosis. No high-grade bony neural foraminal narrowing. Osseous fusion across the disc space at T3-T4. Upper chest: No consolidation within the imaged lung apices. No visible pneumothorax. IMPRESSION: CT head: 1. No evidence of acute intracranial abnormality. 2. Mild chronic small vessel changes within the cerebral white matter. 3. Moderate generalized cerebral atrophy. CT cervical spine: 1. No evidence  of acute fracture to the cervical spine. 2. Mild chronic T1 and T3 vertebral compression deformities, unchanged from the prior cervical spine CT of 11/05/2021. 3. Mild for age cervical spondylosis. Electronically Signed   By: Kellie Simmering D.O.   On: 10/10/2022 10:18   DG Pelvis 1-2 Views  Result Date: 10/10/2022 CLINICAL DATA:  Fall today.  Pelvic pain. EXAM: PELVIS - 1-2 VIEW COMPARISON:  None Available. FINDINGS: Nondisplaced fracture involving the right inferior pubic ramus is seen which is of indeterminate age radiographically. No other acute fractures identified. No evidence of pelvic diastasis. Moderate right and mild left hip osteoarthritis noted. IMPRESSION: Nondisplaced fracture of the right inferior pubic ramus, of indeterminate age radiographically. Bilateral hip osteoarthritis. Electronically Signed   By: Marlaine Hind M.D.   On: 10/10/2022 10:14   (Echo, Carotid, EGD, Colonoscopy, ERCP)    Subjective: Patient seen in the morning rounds.  Sleepy.  Laying on the surgical site and without any complaints.  Difficult to interpret but looks comfortable.   Discharge Exam: Vitals:   10/14/22 1951 10/15/22 0535  BP: (!) 134/115 106/60  Pulse: 66 69  Resp: 18 20  Temp: 98.6 F (37 C) 98.6 F (37 C)  SpO2: 100% 99%   Vitals:   10/14/22 0425 10/14/22 1346 10/14/22 1951 10/15/22 0535  BP: (!) 110/50 (!) 109/94 (!) 134/115 106/60  Pulse: 69 76 66 69  Resp: '16 19 18 20  '$ Temp: 98.2 F (36.8 C) 98.3 F (36.8 C) 98.6 F (37 C) 98.6 F (37 C)  TempSrc: Oral Oral Oral Oral  SpO2: 100% 100% 100% 99%  Weight:      Height:        General exam: Appears calm and comfortable on bed.  Mostly sleepy.  Unable to interact. Respiratory system: Clear to auscultation. Respiratory effort normal. Cardiovascular system: S1 & S2 heard, RRR.  Gastrointestinal system: Abdomen is nondistended, soft and nontender. No organomegaly or masses felt. Normal bowel sounds heard. Moves all extremities.  Left  hip incision clean and dry.  Distal neurovascular status intact    The results of significant diagnostics from this hospitalization (including imaging, microbiology, ancillary and laboratory) are listed below for reference.     Microbiology: No results found for this or any previous visit (from the past 240 hour(s)).   Labs: BNP (last 3 results) Recent Labs    05/17/22 1220  BNP 161.0*   Basic Metabolic Panel: Recent Labs  Lab 10/11/22 1716 10/12/22 0642 10/13/22 0623 10/14/22 0457 10/15/22 0453  NA 138 140 138 140 138  K 4.7 4.1 4.2 4.9 5.0  CL 103 108 107 109 110  CO2 '27 25 23 23 25  '$ GLUCOSE 123* 121* 109* 147* 129*  BUN 24* 17 17 24* 24*  CREATININE 1.00 0.85 0.82 1.05* 0.70  CALCIUM 9.8 8.8* 8.2* 7.7* 7.2*   Liver Function Tests: No results for input(s): "AST", "ALT", "ALKPHOS", "BILITOT", "PROT", "  ALBUMIN" in the last 168 hours. No results for input(s): "LIPASE", "AMYLASE" in the last 168 hours. No results for input(s): "AMMONIA" in the last 168 hours. CBC: Recent Labs  Lab 10/11/22 1716 10/12/22 0642 10/13/22 0623 10/14/22 0457 10/15/22 0453  WBC 12.3* 8.7 11.1* 13.5* 11.2*  NEUTROABS 10.0*  --   --   --   --   HGB 12.8 12.7 13.1 11.7* 10.1*  HCT 40.2 38.6 40.6 37.3 31.6*  MCV 90.3 88.5 88.8 91.6 92.7  PLT 233 214 202 161 115*   Cardiac Enzymes: No results for input(s): "CKTOTAL", "CKMB", "CKMBINDEX", "TROPONINI" in the last 168 hours. BNP: Invalid input(s): "POCBNP" CBG: Recent Labs  Lab 10/10/22 1048 10/12/22 1536  GLUCAP 88 111*   D-Dimer No results for input(s): "DDIMER" in the last 72 hours. Hgb A1c No results for input(s): "HGBA1C" in the last 72 hours. Lipid Profile No results for input(s): "CHOL", "HDL", "LDLCALC", "TRIG", "CHOLHDL", "LDLDIRECT" in the last 72 hours. Thyroid function studies No results for input(s): "TSH", "T4TOTAL", "T3FREE", "THYROIDAB" in the last 72 hours.  Invalid input(s): "FREET3" Anemia work up No  results for input(s): "VITAMINB12", "FOLATE", "FERRITIN", "TIBC", "IRON", "RETICCTPCT" in the last 72 hours. Urinalysis    Component Value Date/Time   COLORURINE YELLOW 10/11/2022 2009   APPEARANCEUR CLEAR 10/11/2022 2009   LABSPEC 1.020 10/11/2022 2009   PHURINE 7.0 10/11/2022 2009   GLUCOSEU NEGATIVE 10/11/2022 2009   HGBUR NEGATIVE 10/11/2022 2009   [place] NEGATIVE 10/11/2022 2009   [place] NEGATIVE 10/11/2022 2009   PROTEINUR NEGATIVE 10/11/2022 2009   NITRITE NEGATIVE 10/11/2022 2009   LEUKOCYTESUR SMALL (A) 10/11/2022 2009   Sepsis Labs Recent Labs  Lab 10/12/22 0642 10/13/22 0623 10/14/22 0457 10/15/22 0453  WBC 8.7 11.1* 13.5* 11.2*   Microbiology No results found for this or any previous visit (from the past 240 hour(s)).   Time coordinating discharge:  32 minutes  SIGNED:   Barb Merino, MD  [facility] 10/15/2022, 12:55 PM

## 2022-10-15 NOTE — [place] FL2 (Signed)
[place] MEDICAID FL2 LEVEL OF CARE SCREENING TOOL     IDENTIFICATION  Patient Name: Autumn Wallace Birthdate: May 01, 1932 Sex: female Admission Date (Current Location): 10/11/2022  [facility] and [place] Number:  Herbalist and Address:  [facility],  [place], [place]      Provider Number: [id number]  Attending Physician Name and Address:  Barb Merino, MD  Relative Name and Phone Number:  Daughter, Salina April [phone]    Current Level of Care: Hospital Recommended Level of Care: Memory Care, [place] Prior Approval Number:    Date Approved/Denied:   PASRR Number:    Discharge Plan: Other (Comment) (ALF/Memory Care)    Current Diagnoses: Patient Active Problem List   Diagnosis Date Noted   Leukocytosis 10/12/2022   Malnutrition of moderate degree 10/12/2022   Closed left hip fracture, initial encounter ([place]) 10/11/2022   Osteoporosis 06/24/2022   AMS (altered mental status) 05/17/2022   Acute hypoxic respiratory failure 05/17/2022   Liver lesion, right lobe 05/17/2022   Late onset Alzheimer's disease without behavioral disturbance ([name]) 01/03/2020   Transient ischemic attack 01/03/2020   PSVT (paroxysmal supraventricular tachycardia) 09/09/2016   Osteopenia 06/20/2012   Compression fracture of lumbosacral spine ([name]) 06/23/2010   Tendonitis, Achilles, left 06/15/2010    Orientation RESPIRATION BLADDER Height & Weight        Normal Incontinent, External catheter Weight: 129 lb 13.6 oz (58.9 kg) Height:  '5\' 4"'$  (162.6 cm)  BEHAVIORAL SYMPTOMS/MOOD NEUROLOGICAL BOWEL NUTRITION STATUS      Incontinent Diet (Regular)  AMBULATORY STATUS COMMUNICATION OF NEEDS Skin   Extensive Assist Verbally Normal                       Personal Care Assistance Level of Assistance  Bathing, Feeding, Dressing Bathing Assistance: Maximum assistance Feeding assistance: Limited assistance Dressing Assistance:  Maximum assistance     Functional Limitations Info  Sight, Hearing, Speech Sight Info: Impaired Hearing Info: Adequate Speech Info: Adequate    SPECIAL CARE FACTORS FREQUENCY                       Contractures      Additional Factors Info  Code Status, Allergies, Psychotropic Code Status Info: DNR Allergies Info: Fosamax (Alendronate), Latex, Tetanus Toxoids, Vibra-tab (Doxycycline) Psychotropic Info: See MAR         Current Medications (10/15/2022):  This is the current hospital active medication list Current Facility-Administered Medications  Medication Dose Route Frequency Provider Last Rate Last Admin   0.9 %  sodium chloride infusion   Intravenous Continuous Rod Can, MD 75 mL/hr at 10/14/22 2337 New Bag at 10/14/22 2337   acetaminophen (TYLENOL) tablet 325-650 mg  325-650 mg Oral Q6H PRN Rod Can, MD       aspirin EC tablet 325 mg  325 mg Oral Q breakfast Rod Can, MD   325 mg at 10/14/22 0943   bisacodyl (DULCOLAX) EC tablet 5 mg  5 mg Oral Daily PRN Opyd, Ilene Qua, MD       bisacodyl (DULCOLAX) suppository 10 mg  10 mg Rectal Daily PRN Swinteck, Aaron Edelman, MD       cholecalciferol (VITAMIN D3) 25 MCG (1000 UNIT) tablet 2,000 Units  2,000 Units Oral Daily Rod Can, MD   2,000 Units at 10/14/22 0943   divalproex (DEPAKOTE SPRINKLE) capsule 125 mg  125 mg Oral BID Rod Can, MD   125 mg at 10/14/22  2204   docusate sodium (COLACE) capsule 100 mg  100 mg Oral BID Rod Can, MD   100 mg at 10/14/22 2204   escitalopram (LEXAPRO) tablet 10 mg  10 mg Oral Daily Swinteck, Aaron Edelman, MD   10 mg at 10/14/22 0943   feeding supplement (ENSURE ENLIVE / ENSURE PLUS) liquid 237 mL  237 mL Oral Daily Swinteck, Aaron Edelman, MD   237 mL at 10/14/22 0943   feeding supplement (ENSURE PRE-SURGERY) liquid 296 mL  296 mL Oral Once Rod Can, MD       galantamine (RAZADYNE ER) 24 hr capsule 16 mg  16 mg Oral Daily Swinteck, Aaron Edelman, MD   16 mg at 10/14/22  0943   HYDROcodone-acetaminophen (NORCO) 7.5-325 MG per tablet 1-2 tablet  1-2 tablet Oral Q4H PRN Rod Can, MD       HYDROcodone-acetaminophen (NORCO/VICODIN) 5-325 MG per tablet 1-2 tablet  1-2 tablet Oral Q4H PRN Rod Can, MD       memantine [facility]) tablet 10 mg  10 mg Oral BID Rod Can, MD   10 mg at 10/14/22 2204   menthol-cetylpyridinium (CEPACOL) lozenge 3 mg  1 lozenge Oral PRN Swinteck, Aaron Edelman, MD       Or   phenol (CHLORASEPTIC) mouth spray 1 spray  1 spray Mouth/Throat PRN Swinteck, Aaron Edelman, MD       methocarbamol (ROBAXIN) tablet 500 mg  500 mg Oral Q6H PRN Swinteck, Aaron Edelman, MD       Or   methocarbamol (ROBAXIN) 500 mg in dextrose 5 % 50 mL IVPB  500 mg Intravenous Q6H PRN Swinteck, Aaron Edelman, MD       metoCLOPramide (REGLAN) tablet 5-10 mg  5-10 mg Oral Q8H PRN Swinteck, Aaron Edelman, MD       Or   metoCLOPramide (REGLAN) injection 5-10 mg  5-10 mg Intravenous Q8H PRN Swinteck, Aaron Edelman, MD       morphine (PF) 2 MG/ML injection 0.5-1 mg  0.5-1 mg Intravenous Q2H PRN Swinteck, Aaron Edelman, MD       multivitamin with minerals tablet 1 tablet  1 tablet Oral Daily Swinteck, Aaron Edelman, MD   1 tablet at 10/14/22 0943   ondansetron (ZOFRAN) tablet 4 mg  4 mg Oral Q6H PRN Swinteck, Aaron Edelman, MD       Or   ondansetron (ZOFRAN) injection 4 mg  4 mg Intravenous Q6H PRN Swinteck, Aaron Edelman, MD       polyethylene glycol (MIRALAX / GLYCOLAX) packet 17 g  17 g Oral Daily PRN Opyd, Ilene Qua, MD       polyethylene glycol (MIRALAX / GLYCOLAX) packet 17 g  17 g Oral Daily Shahmehdi, Seyed A, MD   17 g at 10/14/22 0943   senna (SENOKOT) tablet 8.6 mg  1 tablet Oral BID Rod Can, MD   8.6 mg at 10/14/22 2204   sodium phosphate (FLEET) 7-19 GM/118ML enema 1 enema  1 enema Rectal Once PRN Rod Can, MD         Discharge Medications: Please see discharge summary for a list of discharge medications.  Relevant Imaging Results:  Relevant Lab Results:   Additional  Information [id number]  Vassie Moselle, LCSW

## 2022-11-01 ENCOUNTER — Ambulatory Visit: Payer: Self-pay | Admitting: Student

## 2022-11-01 DIAGNOSIS — S72032D Displaced midcervical fracture of left femur, subsequent encounter for closed fracture with routine healing: Secondary | ICD-10-CM | POA: Diagnosis not present

## 2022-11-01 DIAGNOSIS — Z471 Aftercare following joint replacement surgery: Secondary | ICD-10-CM | POA: Diagnosis not present

## 2023-01-13 ENCOUNTER — Other Ambulatory Visit: Payer: Self-pay | Admitting: Pharmacy Technician

## 2023-01-24 ENCOUNTER — Telehealth: Payer: Self-pay | Admitting: Pharmacy Technician

## 2023-01-24 NOTE — Telephone Encounter (Signed)
Auth Submission: APPROVED [place] RENEWAL Payer: [employer] MEDICARE Medication & CPT/J Code(s) submitted: Prolia (Denosumab) [id number] Route of submission (phone, fax, portal):  Phone # Fax # Auth type: Buy/Bill Units/visits requested: X2 Reference number: [id number] Approval from: 06/28/22 to 12/06/23

## 2023-01-28 IMAGING — CT CT HEAD W/O CM
3 series · 16 of 47 positions shown, 19 images · non-contrast
Comparison: 12/22/2020

CLINICAL DATA: Mental status change, unknown cause

EXAM:
CT HEAD WITHOUT CONTRAST
TECHNIQUE: Contiguous axial images were obtained from the base of the skull
through the vertex without intravenous contrast.

[Series 2: head wo · axial · 0.43mm/px · z∈[-152,-17]mm · 10 of 33 slices shown, 13 images]
[im 3/33  brain]
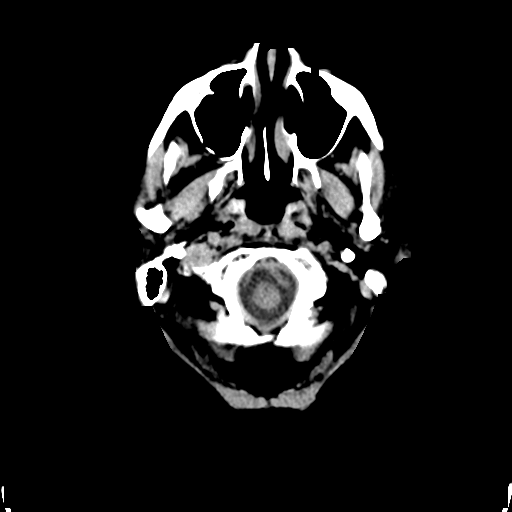
[im 3/33  bone]
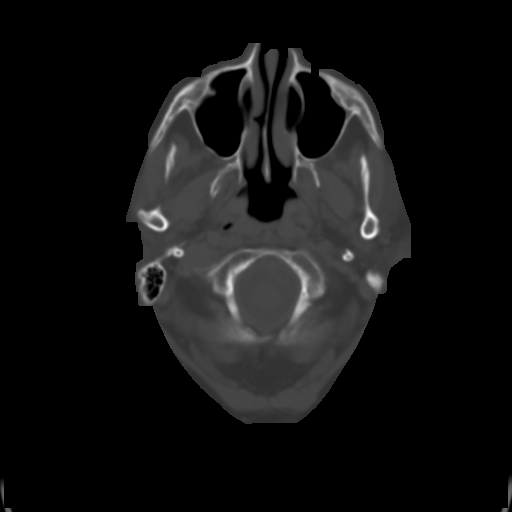
[im 6/33  brain]
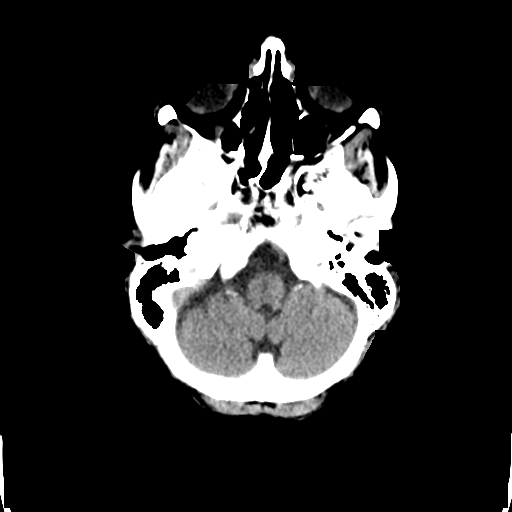
[im 9/33  brain]
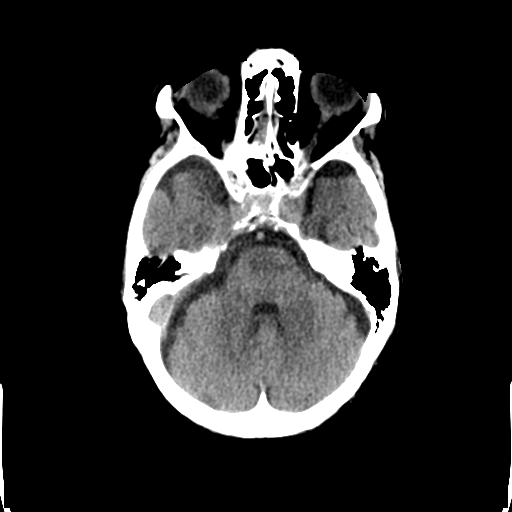
[im 12/33  brain]
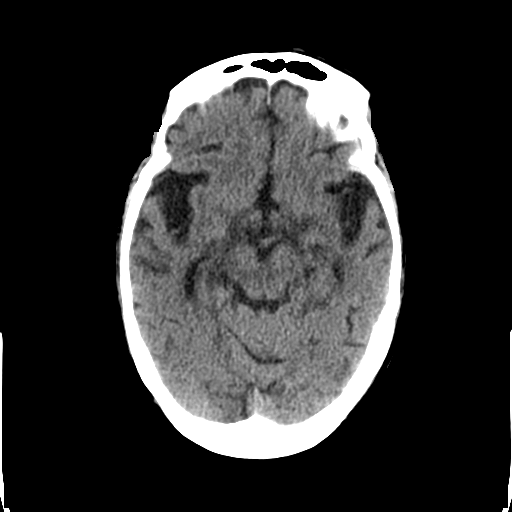
[im 15/33  brain]
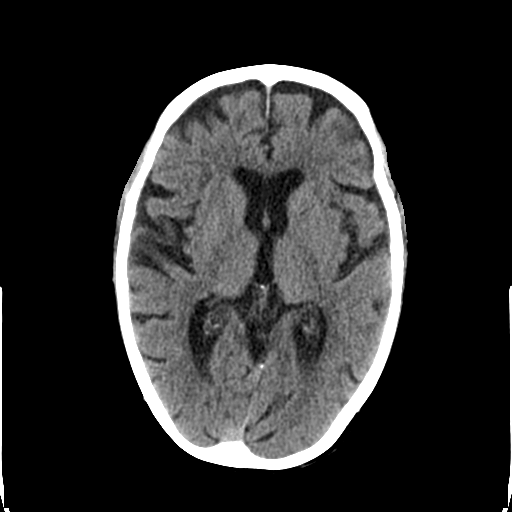
[im 15/33  bone]
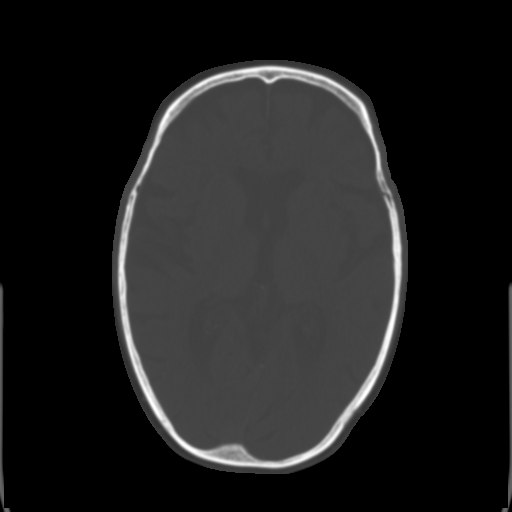
[im 18/33  brain]
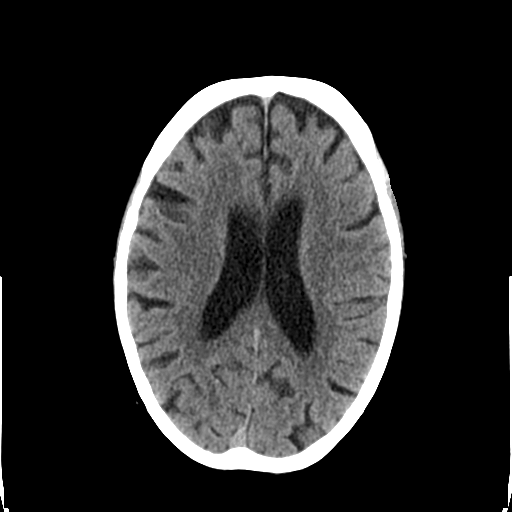
[im 21/33  brain]
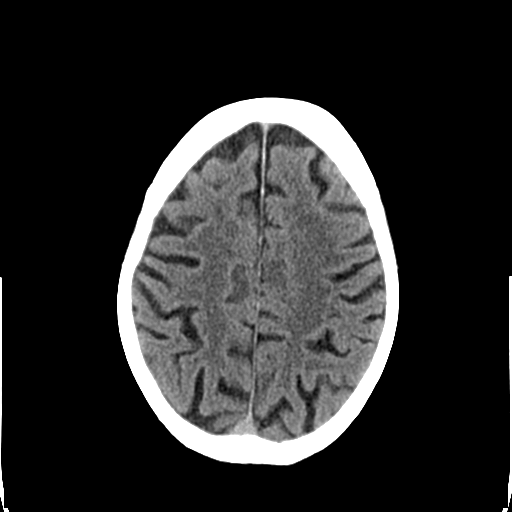
[im 25/33  brain]
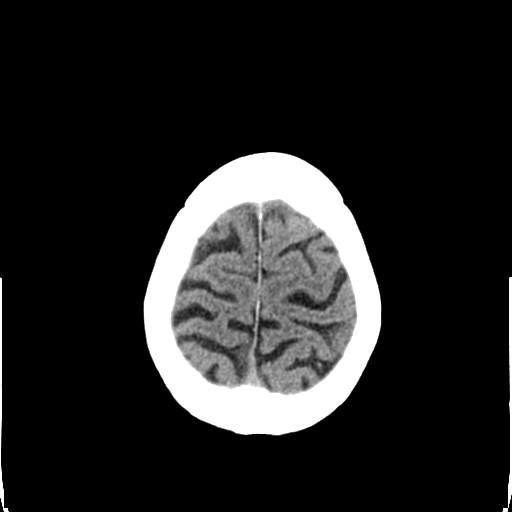
[im 27/33  brain]
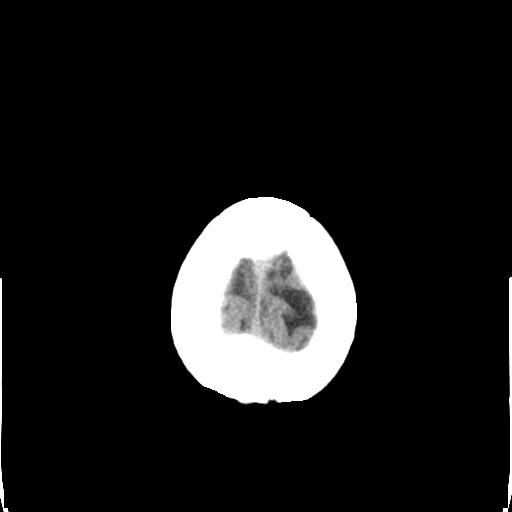
[im 27/33  bone]
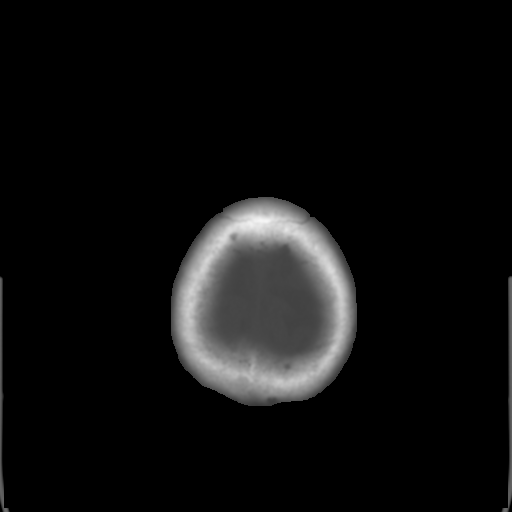
[im 30/33  brain]
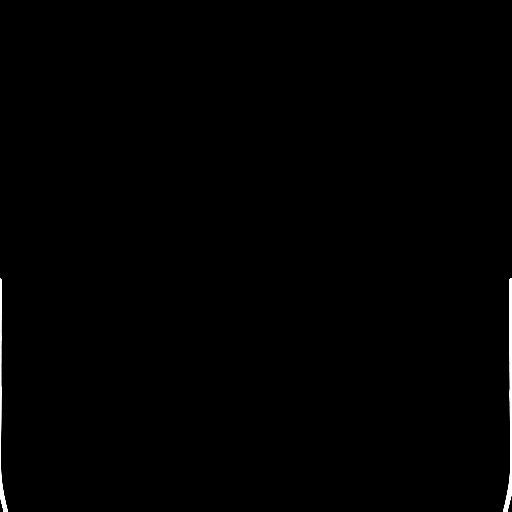

[Series 5: coronal soft tissue · coronal · 0.28mm/px · 3 of 64 slices shown]
[im 22/64  brain]
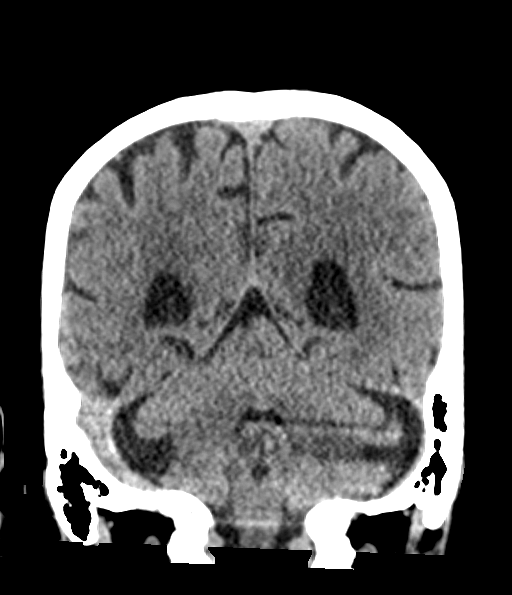
[im 29/64  brain]
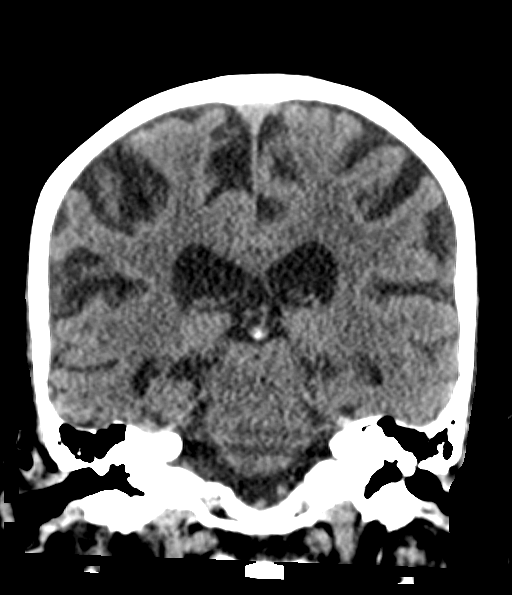
[im 36/64  brain]
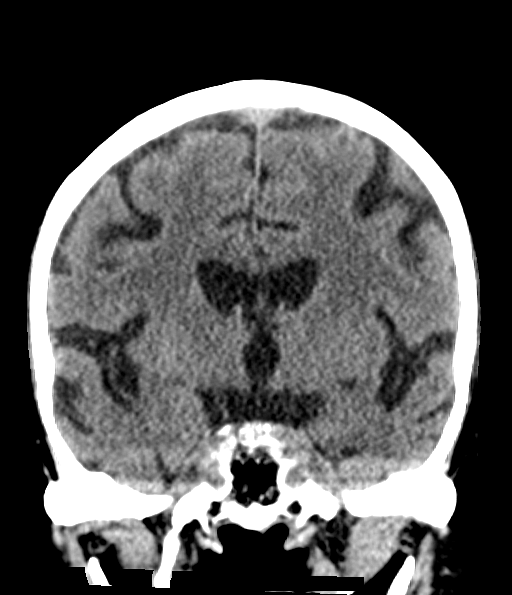

[Series 6: sagittal soft tissue · sagittal · 0.32mm/px · 3 of 48 slices shown]
[im 16/48  brain]
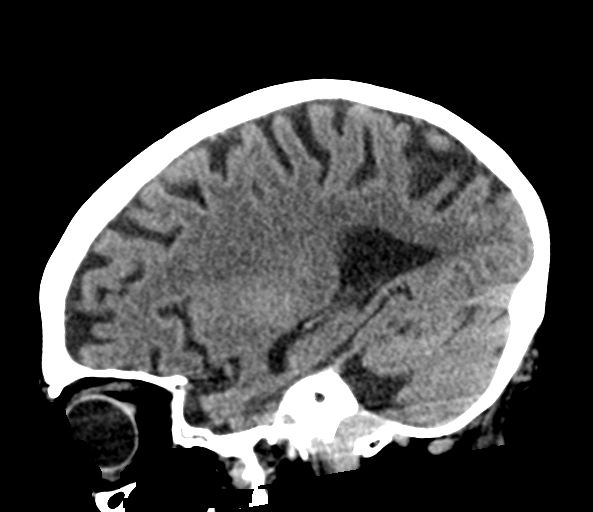
[im 24/48  brain]
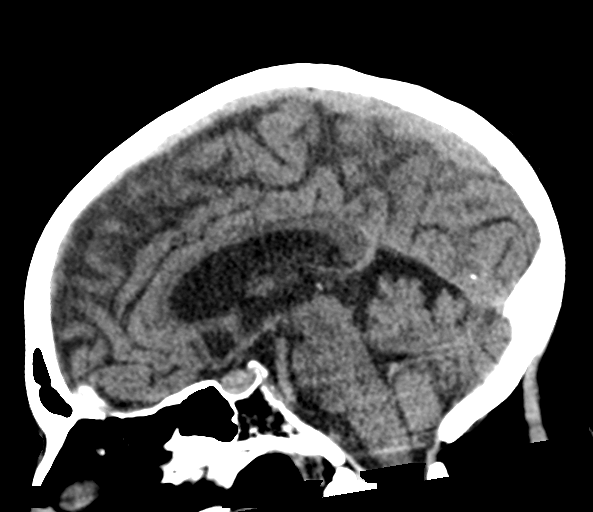
[im 32/48  brain]
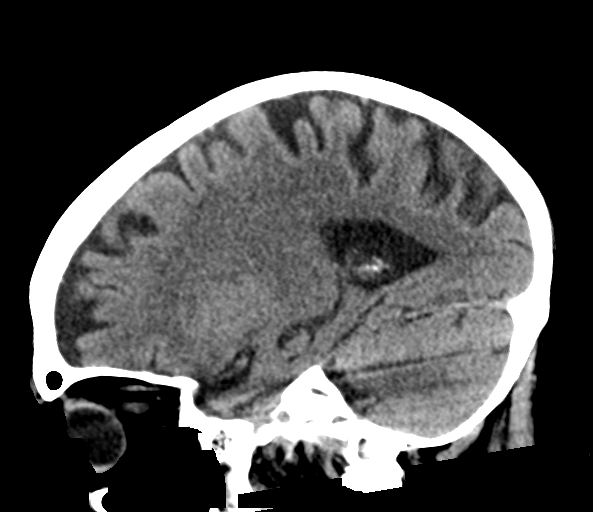

[16 of 47 positions shown; findings below may reference images not displayed]

FINDINGS: Brain: There is no acute intracranial hemorrhage, mass effect, or
edema. Gray-white differentiation is preserved. There is no
extra-axial fluid collection. Prominence of the ventricles and sulci
reflects generalized parenchymal volume loss. Patchy low-attenuation
in the supratentorial white matter is nonspecific but probably
reflects chronic microvascular ischemic changes. These findings are
similar to the prior study.

Vascular: No hyperdense vessel.There is minor atherosclerotic
calcification at the skull base.

Skull: Calvarium is unremarkable.

Sinuses/Orbits: No acute finding.

Other: None.
IMPRESSION: No acute intracranial abnormality.

## 2023-02-02 ENCOUNTER — Other Ambulatory Visit: Payer: Self-pay | Admitting: Orthopedic Surgery

## 2023-02-02 DIAGNOSIS — S72032D Displaced midcervical fracture of left femur, subsequent encounter for closed fracture with routine healing: Secondary | ICD-10-CM

## 2023-02-11 ENCOUNTER — Other Ambulatory Visit (HOSPITAL_COMMUNITY): Payer: Self-pay | Admitting: Orthopedic Surgery

## 2023-02-11 DIAGNOSIS — S72032D Displaced midcervical fracture of left femur, subsequent encounter for closed fracture with routine healing: Secondary | ICD-10-CM

## 2023-02-14 ENCOUNTER — Ambulatory Visit (HOSPITAL_COMMUNITY)
Admission: RE | Admit: 2023-02-14 | Discharge: 2023-02-14 | Disposition: A | Discharge status: Home / Self Care | Payer: Medicare PPO | Source: Ambulatory Visit | Attending: Orthopedic Surgery | Admitting: Orthopedic Surgery

## 2023-02-14 DIAGNOSIS — S72032D Displaced midcervical fracture of left femur, subsequent encounter for closed fracture with routine healing: Secondary | ICD-10-CM | POA: Diagnosis not present

## 2023-03-21 ENCOUNTER — Ambulatory Visit: Payer: Medicare PPO

## 2023-09-15 DIAGNOSIS — H10503 Unspecified blepharoconjunctivitis, bilateral: Secondary | ICD-10-CM | POA: Diagnosis not present

## 2024-01-09 DIAGNOSIS — Z1389 Encounter for screening for other disorder: Secondary | ICD-10-CM | POA: Diagnosis not present

## 2024-01-09 DIAGNOSIS — N183 Chronic kidney disease, stage 3 unspecified: Secondary | ICD-10-CM | POA: Diagnosis not present

## 2024-01-09 DIAGNOSIS — Z0000 Encounter for general adult medical examination without abnormal findings: Secondary | ICD-10-CM | POA: Diagnosis not present

## 2024-01-13 ENCOUNTER — Other Ambulatory Visit (HOSPITAL_COMMUNITY): Payer: Self-pay

## 2024-01-13 DIAGNOSIS — D649 Anemia, unspecified: Secondary | ICD-10-CM

## 2024-01-16 ENCOUNTER — Ambulatory Visit (HOSPITAL_COMMUNITY)
Admission: RE | Admit: 2024-01-16 | Discharge: 2024-01-16 | Disposition: A | Discharge status: Home / Self Care | Payer: Medicare PPO | Source: Ambulatory Visit | Attending: Internal Medicine | Admitting: Internal Medicine

## 2024-01-16 DIAGNOSIS — D649 Anemia, unspecified: Secondary | ICD-10-CM | POA: Insufficient documentation

## 2024-01-16 LAB — PREPARE RBC (CROSSMATCH)

## 2024-01-16 MED ORDER — ACETAMINOPHEN 325 MG PO TABS
ORAL_TABLET | ORAL | Status: AC
  Filled 2024-01-16: qty 2

## 2024-01-16 MED ORDER — ACETAMINOPHEN 325 MG PO TABS
650.0000 mg | ORAL_TABLET | Freq: Two times a day (BID) | ORAL | Status: AC
  Administered 2024-01-16 (×2): 650 mg via ORAL

## 2024-01-16 MED ORDER — ACETAMINOPHEN 325 MG PO TABS
ORAL_TABLET | ORAL | Status: AC
Start: 2024-01-16 — End: 2024-01-16
  Filled 2024-01-16: qty 2

## 2024-01-16 MED ORDER — SODIUM CHLORIDE 0.9% IV SOLUTION: Freq: Once | INTRAVENOUS | Status: DC

## 2024-01-17 LAB — TYPE AND SCREEN
ABO/RH(D): B NEG
Antibody Screen: NEGATIVE
Unit division: 0
Unit division: 0

## 2024-01-17 LAB — BPAM RBC
Blood Product Expiration Date: 202502192359
Blood Product Expiration Date: 202502242359
ISSUE DATE / TIME: 202502100923
ISSUE DATE / TIME: 202502101226
Unit Type and Rh: 1700
Unit Type and Rh: 1700

## 2024-01-24 DIAGNOSIS — E785 Hyperlipidemia, unspecified: Secondary | ICD-10-CM | POA: Diagnosis not present

## 2024-01-24 DIAGNOSIS — N183 Chronic kidney disease, stage 3 unspecified: Secondary | ICD-10-CM | POA: Diagnosis not present

## 2024-01-24 DIAGNOSIS — I129 Hypertensive chronic kidney disease with stage 1 through stage 4 chronic kidney disease, or unspecified chronic kidney disease: Secondary | ICD-10-CM | POA: Diagnosis not present

## 2024-01-27 DIAGNOSIS — H0100B Unspecified blepharitis left eye, upper and lower eyelids: Secondary | ICD-10-CM | POA: Diagnosis not present

## 2024-01-27 DIAGNOSIS — H0100A Unspecified blepharitis right eye, upper and lower eyelids: Secondary | ICD-10-CM | POA: Diagnosis not present

## 2024-03-06 DIAGNOSIS — S0012XA Contusion of left eyelid and periocular area, initial encounter: Secondary | ICD-10-CM | POA: Diagnosis not present

## 2024-03-21 DIAGNOSIS — N390 Urinary tract infection, site not specified: Secondary | ICD-10-CM | POA: Diagnosis not present

## 2024-09-18 DIAGNOSIS — R1024 Suprapubic pain: Secondary | ICD-10-CM | POA: Diagnosis not present
